# Patient Record
Sex: Female | Born: 1985 | Race: Black or African American | Hispanic: No | Marital: Married | Smoking: Current every day smoker
Health system: Southern US, Community
[De-identification: ages and names within clinical notes are randomized; demographics above are authoritative.]

## PROBLEM LIST (undated history)

## (undated) ENCOUNTER — Emergency Department (HOSPITAL_COMMUNITY): Admission: EM | Discharge status: Absent without leave | Payer: Self-pay

## (undated) DIAGNOSIS — Z8739 Personal history of other diseases of the musculoskeletal system and connective tissue: Secondary | ICD-10-CM

## (undated) DIAGNOSIS — Z9289 Personal history of other medical treatment: Secondary | ICD-10-CM

## (undated) DIAGNOSIS — K219 Gastro-esophageal reflux disease without esophagitis: Secondary | ICD-10-CM

## (undated) DIAGNOSIS — Z862 Personal history of diseases of the blood and blood-forming organs and certain disorders involving the immune mechanism: Secondary | ICD-10-CM

## (undated) DIAGNOSIS — I10 Essential (primary) hypertension: Secondary | ICD-10-CM

## (undated) DIAGNOSIS — M722 Plantar fascial fibromatosis: Secondary | ICD-10-CM

## (undated) DIAGNOSIS — Z87898 Personal history of other specified conditions: Secondary | ICD-10-CM

## (undated) DIAGNOSIS — J45909 Unspecified asthma, uncomplicated: Secondary | ICD-10-CM

## (undated) DIAGNOSIS — R599 Enlarged lymph nodes, unspecified: Secondary | ICD-10-CM

## (undated) DIAGNOSIS — F1910 Other psychoactive substance abuse, uncomplicated: Secondary | ICD-10-CM

## (undated) DIAGNOSIS — IMO0002 Reserved for concepts with insufficient information to code with codable children: Secondary | ICD-10-CM

## (undated) HISTORY — DX: Personal history of diseases of the blood and blood-forming organs and certain disorders involving the immune mechanism: Z86.2

## (undated) HISTORY — DX: Personal history of other medical treatment: Z92.89

## (undated) HISTORY — DX: Personal history of other diseases of the musculoskeletal system and connective tissue: Z87.39

## (undated) HISTORY — DX: Enlarged lymph nodes, unspecified: R59.9

## (undated) HISTORY — DX: Plantar fascial fibromatosis: M72.2

## (undated) HISTORY — DX: Personal history of other specified conditions: Z87.898

## (undated) HISTORY — PX: OTHER SURGICAL HISTORY: SHX169

---
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
## 2005-11-08 ENCOUNTER — Emergency Department (HOSPITAL_COMMUNITY): Admission: EM | Admit: 2005-11-08 | Discharge: 2005-11-08 | Payer: Self-pay | Admitting: Emergency Medicine

## 2006-01-19 ENCOUNTER — Emergency Department (HOSPITAL_COMMUNITY): Admission: EM | Admit: 2006-01-19 | Discharge: 2006-01-19 | Payer: Self-pay | Admitting: Emergency Medicine

## 2006-03-10 ENCOUNTER — Emergency Department (HOSPITAL_COMMUNITY): Admission: EM | Admit: 2006-03-10 | Discharge: 2006-03-10 | Payer: Self-pay | Admitting: Emergency Medicine

## 2006-07-17 ENCOUNTER — Emergency Department (HOSPITAL_COMMUNITY): Admission: EM | Admit: 2006-07-17 | Discharge: 2006-07-17 | Payer: Self-pay | Admitting: Emergency Medicine

## 2006-12-12 ENCOUNTER — Emergency Department (HOSPITAL_COMMUNITY): Admission: EM | Admit: 2006-12-12 | Discharge: 2006-12-12 | Payer: Self-pay | Admitting: Emergency Medicine

## 2008-02-11 ENCOUNTER — Emergency Department (HOSPITAL_COMMUNITY): Admission: EM | Admit: 2008-02-11 | Discharge: 2008-02-11 | Payer: Self-pay | Admitting: Emergency Medicine

## 2010-05-26 LAB — DIFFERENTIAL
Eosinophils Relative: 3 % (ref 0–5)
Lymphocytes Relative: 35 % (ref 12–46)
Lymphs Abs: 2.9 10*3/uL (ref 0.7–4.0)
Monocytes Absolute: 0.4 10*3/uL (ref 0.1–1.0)
Neutro Abs: 4.7 10*3/uL (ref 1.7–7.7)

## 2010-05-26 LAB — COMPREHENSIVE METABOLIC PANEL
ALT: 15 U/L (ref 0–35)
AST: 17 U/L (ref 0–37)
Albumin: 3.8 g/dL (ref 3.5–5.2)
Alkaline Phosphatase: 38 U/L — ABNORMAL LOW (ref 39–117)
BUN: 10 mg/dL (ref 6–23)
CO2: 22 mEq/L (ref 19–32)
Calcium: 8.9 mg/dL (ref 8.4–10.5)
Chloride: 104 mEq/L (ref 96–112)
Creatinine, Ser: 0.49 mg/dL (ref 0.4–1.2)
GFR calc Af Amer: >60 mL/min (ref >60)
GFR calc non Af Amer: >60 mL/min (ref >60)
Glucose, Bld: 92 mg/dL (ref 70–99)
Potassium: 3.7 mEq/L (ref 3.5–5.1)
Sodium: 134 mEq/L — ABNORMAL LOW (ref 135–145)
Total Bilirubin: 0.6 mg/dL (ref 0.3–1.2)
Total Protein: 6.8 g/dL (ref 6.0–8.3)

## 2010-05-26 LAB — CBC
HCT: 35.2 % — ABNORMAL LOW (ref 36.0–46.0)
Hemoglobin: 11.1 g/dL — ABNORMAL LOW (ref 12.0–15.0)
MCHC: 31.4 g/dL (ref 30.0–36.0)
MCV: 79.7 fL (ref 78.0–100.0)
Platelets: 261 10*3/uL (ref 150–400)
RBC: 4.42 MIL/uL (ref 3.87–5.11)
RDW: 15.8 % — ABNORMAL HIGH (ref 11.5–15.5)
WBC: 8.4 10*3/uL (ref 4.0–10.5)

## 2010-05-26 LAB — LIPASE, BLOOD: Lipase: 47 U/L (ref 11–59)

## 2010-11-18 LAB — RAPID URINE DRUG SCREEN, HOSP PERFORMED
Amphetamines: NOT DETECTED
Barbiturates: NOT DETECTED
Benzodiazepines: NOT DETECTED
Opiates: POSITIVE — AB

## 2010-11-18 LAB — DIFFERENTIAL
Basophils Absolute: 0
Basophils Relative: 0
Eosinophils Absolute: 0.1
Eosinophils Relative: 0
Lymphs Abs: 2.4
Neutrophils Relative %: 82 — ABNORMAL HIGH

## 2010-11-18 LAB — COMPREHENSIVE METABOLIC PANEL
ALT: 14
AST: 21
Alkaline Phosphatase: 50
CO2: 21
Calcium: 9.7
Chloride: 106
GFR calc non Af Amer: >60
Glucose, Bld: 133 — ABNORMAL HIGH
Sodium: 142
Total Bilirubin: 0.5

## 2010-11-18 LAB — URINALYSIS, ROUTINE W REFLEX MICROSCOPIC
Bilirubin Urine: NEGATIVE
Ketones, ur: >80 — AB
Nitrite: NEGATIVE
Protein, ur: NEGATIVE
Urobilinogen, UA: 0.2

## 2010-11-18 LAB — URINE MICROSCOPIC-ADD ON

## 2010-11-18 LAB — CBC
Hemoglobin: 13.4
MCHC: 33.2
RBC: 4.89
WBC: 15.5 — ABNORMAL HIGH

## 2010-11-18 LAB — URINE CULTURE: Culture: NO GROWTH

## 2010-11-18 LAB — POCT PREGNANCY, URINE: Operator id: 29026

## 2010-11-18 LAB — LIPASE, BLOOD: Lipase: 30

## 2010-11-27 LAB — POCT URINALYSIS DIP (DEVICE)
Nitrite: POSITIVE — AB
Protein, ur: >=300 — AB
Specific Gravity, Urine: 1.015
Urobilinogen, UA: 1
pH: 8.5 — ABNORMAL HIGH

## 2010-11-27 LAB — URINE CULTURE: Colony Count: 70000

## 2010-11-27 LAB — URINALYSIS, ROUTINE W REFLEX MICROSCOPIC
Glucose, UA: NEGATIVE
Ketones, ur: 15 — AB
Protein, ur: >300 — AB
Urobilinogen, UA: 1

## 2010-11-27 LAB — RAPID URINE DRUG SCREEN, HOSP PERFORMED
Amphetamines: NOT DETECTED
Barbiturates: NOT DETECTED
Opiates: NOT DETECTED
Tetrahydrocannabinol: POSITIVE — AB

## 2010-11-27 LAB — I-STAT 8, (EC8 V) (CONVERTED LAB)
Acid-base deficit: 2
Chloride: 107
Hemoglobin: 16 — ABNORMAL HIGH
Potassium: 3.3 — ABNORMAL LOW
Sodium: 140
TCO2: 24

## 2010-11-27 LAB — POCT PREGNANCY, URINE: Preg Test, Ur: NEGATIVE

## 2010-11-27 LAB — POCT I-STAT CREATININE: Operator id: 270961

## 2010-11-27 LAB — URINE MICROSCOPIC-ADD ON

## 2012-08-06 ENCOUNTER — Emergency Department (HOSPITAL_COMMUNITY)
Admission: EM | Admit: 2012-08-06 | Discharge: 2012-08-06 | Disposition: A | Discharge status: Home / Self Care | Payer: Self-pay | Attending: Emergency Medicine | Admitting: Emergency Medicine

## 2012-08-06 ENCOUNTER — Emergency Department (HOSPITAL_COMMUNITY): Payer: Self-pay

## 2012-08-06 ENCOUNTER — Encounter (HOSPITAL_COMMUNITY): Payer: Self-pay | Admitting: Emergency Medicine

## 2012-08-06 DIAGNOSIS — K219 Gastro-esophageal reflux disease without esophagitis: Secondary | ICD-10-CM | POA: Insufficient documentation

## 2012-08-06 DIAGNOSIS — Z872 Personal history of diseases of the skin and subcutaneous tissue: Secondary | ICD-10-CM | POA: Insufficient documentation

## 2012-08-06 DIAGNOSIS — R1013 Epigastric pain: Secondary | ICD-10-CM | POA: Insufficient documentation

## 2012-08-06 DIAGNOSIS — Z79899 Other long term (current) drug therapy: Secondary | ICD-10-CM | POA: Insufficient documentation

## 2012-08-06 DIAGNOSIS — J45909 Unspecified asthma, uncomplicated: Secondary | ICD-10-CM | POA: Insufficient documentation

## 2012-08-06 DIAGNOSIS — E860 Dehydration: Secondary | ICD-10-CM | POA: Insufficient documentation

## 2012-08-06 DIAGNOSIS — R109 Unspecified abdominal pain: Secondary | ICD-10-CM

## 2012-08-06 DIAGNOSIS — Z3202 Encounter for pregnancy test, result negative: Secondary | ICD-10-CM | POA: Insufficient documentation

## 2012-08-06 DIAGNOSIS — N83209 Unspecified ovarian cyst, unspecified side: Secondary | ICD-10-CM | POA: Insufficient documentation

## 2012-08-06 DIAGNOSIS — F17200 Nicotine dependence, unspecified, uncomplicated: Secondary | ICD-10-CM | POA: Insufficient documentation

## 2012-08-06 HISTORY — DX: Unspecified asthma, uncomplicated: J45.909

## 2012-08-06 HISTORY — DX: Reserved for concepts with insufficient information to code with codable children: IMO0002

## 2012-08-06 LAB — COMPREHENSIVE METABOLIC PANEL
AST: 19 U/L (ref 0–37)
CO2: 23 mEq/L (ref 19–32)
Calcium: 9.1 mg/dL (ref 8.4–10.5)
Creatinine, Ser: 0.58 mg/dL (ref 0.50–1.10)
GFR calc non Af Amer: >90 mL/min (ref >90)
Total Protein: 8.1 g/dL (ref 6.0–8.3)

## 2012-08-06 LAB — URINALYSIS, DIPSTICK ONLY
Glucose, UA: NEGATIVE mg/dL
Hgb urine dipstick: NEGATIVE
Protein, ur: NEGATIVE mg/dL
Specific Gravity, Urine: 1.02 (ref 1.005–1.030)
Urobilinogen, UA: 0.2 mg/dL (ref 0.0–1.0)
pH: 6.5 (ref 5.0–8.0)

## 2012-08-06 LAB — CBC
MCH: 30.4 pg (ref 26.0–34.0)
MCHC: 34.8 g/dL (ref 30.0–36.0)
MCV: 87.6 fL (ref 78.0–100.0)
Platelets: 269 10*3/uL (ref 150–400)
WBC: 19.2 10*3/uL — ABNORMAL HIGH (ref 4.0–10.5)

## 2012-08-06 MED ORDER — GI COCKTAIL ~~LOC~~
30.0000 mL | Freq: Once | ORAL | Status: AC
  Administered 2012-08-06: 30 mL via ORAL
  Filled 2012-08-06: qty 30

## 2012-08-06 MED ORDER — HYDROMORPHONE HCL PF 1 MG/ML IJ SOLN
1.0000 mg | INTRAMUSCULAR | Status: DC | PRN
  Administered 2012-08-06 (×2): 1 mg via INTRAVENOUS
  Filled 2012-08-06 (×2): qty 1

## 2012-08-06 MED ORDER — OXYCODONE-ACETAMINOPHEN 5-325 MG PO TABS: 1.0000 | ORAL_TABLET | ORAL | Status: DC | PRN

## 2012-08-06 MED ORDER — SODIUM CHLORIDE 0.9 % IV BOLUS (SEPSIS)
1000.0000 mL | Freq: Once | INTRAVENOUS | Status: AC
  Administered 2012-08-06: 1000 mL via INTRAVENOUS

## 2012-08-06 MED ORDER — ONDANSETRON HCL 8 MG PO TABS: 8.0000 mg | ORAL_TABLET | Freq: Two times a day (BID) | ORAL | Status: DC | PRN

## 2012-08-06 MED ORDER — ONDANSETRON HCL 4 MG/2ML IJ SOLN
4.0000 mg | Freq: Once | INTRAMUSCULAR | Status: AC
  Administered 2012-08-06: 4 mg via INTRAVENOUS
  Filled 2012-08-06: qty 2

## 2012-08-06 MED ORDER — ONDANSETRON 4 MG PO TBDP: ORAL_TABLET | ORAL | Status: DC

## 2012-08-06 MED ORDER — METOCLOPRAMIDE HCL 5 MG/ML IJ SOLN
10.0000 mg | Freq: Once | INTRAMUSCULAR | Status: AC
  Administered 2012-08-06: 10 mg via INTRAVENOUS
  Filled 2012-08-06: qty 2

## 2012-08-06 MED ORDER — OMEPRAZOLE 20 MG PO CPDR: 20.0000 mg | DELAYED_RELEASE_CAPSULE | Freq: Every day | ORAL | Status: DC

## 2012-08-06 MED ORDER — IOHEXOL 300 MG/ML  SOLN
100.0000 mL | Freq: Once | INTRAMUSCULAR | Status: AC | PRN
  Administered 2012-08-06: 100 mL via INTRAVENOUS

## 2012-08-06 NOTE — ED Notes (Signed)
Pt had 1 can of sprite. No n/v. Pt given another sprite per request. Will continue to monitor.

## 2012-08-06 NOTE — ED Provider Notes (Signed)
Patient seen/examined in the Emergency Department in conjunction with Resident Physician Provider [place] Patient reports abdominal pain after eating Exam : pt is anxious, appears uncomfortable and has epigastric tenderness Plan: will treat nausea, rehydrate and reassess.  She may need abdominal imaging    Joya Gaskins, MD 08/06/12 [phone]

## 2012-08-06 NOTE — ED Notes (Signed)
Pt complaining of abdominal pain and n/v. Currently no n/v. Pt continues to complain of abdominal burning. No cardiac or respiratory distress. Will continue to monitor.

## 2012-08-06 NOTE — ED Notes (Signed)
Pt. Stated, i started vomiting 3 hours ago, I think its my ulcer acting up.

## 2012-08-06 NOTE — ED Provider Notes (Signed)
History    CSN: [id number] Arrival date & time 08/06/12  1455  First MD Initiated Contact with Patient 08/06/12 1505     Chief Complaint  Patient presents with  . Emesis   (Consider location/radiation/quality/duration/timing/severity/associated sxs/prior Treatment) Patient is a 27 y.o. female presenting with abdominal pain.  Abdominal Pain This is a recurrent problem. The current episode started today (3 hours ago). The problem occurs constantly. The problem has been unchanged. Associated symptoms include abdominal pain, nausea and vomiting. Pertinent negatives include no chest pain, chills, congestion, coughing, fatigue, fever, numbness, rash, sore throat, swollen glands or urinary symptoms. The symptoms are aggravated by eating and drinking. She has tried nothing for the symptoms.   Past Medical History  Diagnosis Date  . Ulcer   . Asthma    History reviewed. No pertinent past surgical history. No family history on file. History  Substance Use Topics  . Smoking status: Current Every Day Smoker    Types: Cigarettes  . Smokeless tobacco: Not on file  . Alcohol Use: Yes   OB History   Grav Para Term Preterm Abortions TAB SAB Ect Mult Living                 Review of Systems  Constitutional: Negative for fever, chills and fatigue.  HENT: Negative for congestion, sore throat and rhinorrhea.   Eyes: Negative for photophobia and visual disturbance.  Respiratory: Negative for cough and shortness of breath.   Cardiovascular: Negative for chest pain and leg swelling.  Gastrointestinal: Positive for nausea, vomiting and abdominal pain. Negative for diarrhea and constipation.  Endocrine: Negative for polyphagia and polyuria.  Genitourinary: Negative for dysuria, flank pain, vaginal bleeding, vaginal discharge and enuresis.  Musculoskeletal: Negative for back pain and gait problem.  Skin: Negative for color change and rash.  Neurological: Negative for dizziness, syncope,  light-headedness and numbness.  Hematological: Negative for adenopathy. Does not bruise/bleed easily.  All other systems reviewed and are negative.    Allergies  Review of patient's allergies indicates no known allergies.  Home Medications   Current Outpatient Rx  Name  Route  Sig  Dispense  Refill  . omeprazole (PRILOSEC) 20 MG capsule   Oral   Take 1 capsule (20 mg total) by mouth daily.   30 capsule   0   . ondansetron (ZOFRAN ODT) 4 MG disintegrating tablet      4mg  ODT q4 hours prn nausea/vomit   4 tablet   0   . oxyCODONE-acetaminophen (PERCOCET/ROXICET) 5-325 MG per tablet   Oral   Take 1 tablet by mouth every 4 (four) hours as needed for pain.   5 tablet   0    BP 111/75  Pulse 63  Temp(Src) 98 F (36.7 C) (Oral)  Resp 22  Ht 4\' 9"  (1.448 m)  Wt 150 lb (68.04 kg)  BMI 32.45 kg/m2  SpO2 99%  LMP 06/11/2012 Physical Exam  Vitals reviewed. Constitutional: She is oriented to person, place, and time. She appears well-developed and well-nourished.  HENT:  Head: Normocephalic and atraumatic.  Right Ear: External ear normal.  Left Ear: External ear normal.  Eyes: Conjunctivae and EOM are normal. Pupils are equal, round, and reactive to light.  Neck: Normal range of motion. Neck supple.  Cardiovascular: Normal rate, regular rhythm, normal heart sounds and intact distal pulses.   Pulmonary/Chest: Effort normal and breath sounds normal.  Abdominal: Soft. Bowel sounds are normal. There is tenderness in the epigastric area.  Musculoskeletal: Normal range  of motion.  Neurological: She is alert and oriented to person, place, and time.  Skin: Skin is warm and dry.    ED Course  Procedures (including critical care time) Labs Reviewed  COMPREHENSIVE METABOLIC PANEL - Abnormal; Notable for the following:    Potassium 3.4 (*)    Glucose, Bld 110 (*)    BUN 5 (*)    Total Bilirubin 0.2 (*)    All other components within normal limits  CBC - Abnormal; Notable for  the following:    WBC 19.2 (*)    All other components within normal limits  URINALYSIS, DIPSTICK ONLY - Abnormal; Notable for the following:    Ketones, ur 15 (*)    Leukocytes, UA SMALL (*)    All other components within normal limits  CG4 I-STAT (LACTIC ACID) - Abnormal; Notable for the following:    Lactic Acid, Venous 2.89 (*)    All other components within normal limits  LIPASE, BLOOD  POCT PREGNANCY, URINE   Ct Abdomen Pelvis W Contrast  08/06/2012   *RADIOLOGY REPORT*  Clinical Data: Abdominal pain.  Emesis.  History of ulcer.  CT ABDOMEN AND PELVIS WITH CONTRAST  Technique:  Multidetector CT imaging of the abdomen and pelvis was performed following the standard protocol during bolus administration of intravenous contrast.  Contrast: OMNIPAQUE IOHEXOL 300 MG/ML  SOLN  Comparison: 07/17/2006  Findings: Contrast medium is present in the distal esophagus suggesting gastroesophageal reflux.  The liver, spleen, pancreas, and adrenal glands appear unremarkable.  The kidneys appear unremarkable, as do the proximal ureters.  Appendix normal.  Hypodense lesion in the right ovary measures 2.4 x 2.1 cm.  Uterus unremarkable.  Urinary bladder normal.  No free pelvic fluid.  No dilated bowel.  IMPRESSION:  1.  Dense contrast medium is present in the stomach and in the distal esophagus, suspicious for esophageal reflux.  No obvious inflammatory process along the proximal duodenum. 2.  Right ovarian hypodense lesion, likely a cyst, measuring up to 2.4 cm in long axis.   Original Report Authenticated By: Gaylyn Rong, M.D.   1. Abdominal pain   2. GERD (gastroesophageal reflux disease)   3. Dehydration   4. Ovarian cyst     MDM  27 y.o. female  with pertinent PMH of prior pud presents with abd pain beginning 3 hours ago after eating spicy chips and salsa.  Pt has ho similar symptoms after spicy food.  Physical exam as above with epigastric tenderness, otherwise benign exam. Labs as above  with leukocytosis.  CT scan demonstrated likely reflux.  Pt improved after reglan and dilaudid, took PO.  Stable for DC home.  Given strict return precautions for changing or worsening symptoms, voices understanding, and agrees to fu.    Labs and imaging as above reviewed by myself and attending,Dr. Bebe Shaggy, with whom case was discussed.   1. Abdominal pain   2. GERD (gastroesophageal reflux disease)   3. Dehydration   4. Ovarian cyst       Noel Gerold, MD 08/07/12 [phone]

## 2012-08-06 NOTE — ED Notes (Signed)
Pt given ice chips and a sprite. Will continue to monitor.

## 2012-08-10 NOTE — ED Provider Notes (Signed)
I have personally seen and examined the patient.  I have discussed the plan of care with the resident.  I have reviewed the documentation on PMH/FH/Soc. History.  I have reviewed the documentation of the resident and agree.   Joya Gaskins, MD 08/10/12 [phone]

## 2012-09-18 ENCOUNTER — Emergency Department (HOSPITAL_COMMUNITY)
Admission: EM | Admit: 2012-09-18 | Discharge: 2012-09-19 | Disposition: A | Discharge status: Home / Self Care | Payer: Self-pay | Attending: Emergency Medicine | Admitting: Emergency Medicine

## 2012-09-18 ENCOUNTER — Encounter (HOSPITAL_COMMUNITY): Payer: Self-pay | Admitting: *Deleted

## 2012-09-18 DIAGNOSIS — R112 Nausea with vomiting, unspecified: Secondary | ICD-10-CM | POA: Insufficient documentation

## 2012-09-18 DIAGNOSIS — Z872 Personal history of diseases of the skin and subcutaneous tissue: Secondary | ICD-10-CM | POA: Insufficient documentation

## 2012-09-18 DIAGNOSIS — R1013 Epigastric pain: Secondary | ICD-10-CM | POA: Insufficient documentation

## 2012-09-18 DIAGNOSIS — R109 Unspecified abdominal pain: Secondary | ICD-10-CM

## 2012-09-18 DIAGNOSIS — J45901 Unspecified asthma with (acute) exacerbation: Secondary | ICD-10-CM | POA: Insufficient documentation

## 2012-09-18 DIAGNOSIS — F17200 Nicotine dependence, unspecified, uncomplicated: Secondary | ICD-10-CM | POA: Insufficient documentation

## 2012-09-18 DIAGNOSIS — Z3202 Encounter for pregnancy test, result negative: Secondary | ICD-10-CM | POA: Insufficient documentation

## 2012-09-18 DIAGNOSIS — Z79899 Other long term (current) drug therapy: Secondary | ICD-10-CM | POA: Insufficient documentation

## 2012-09-18 LAB — URINE MICROSCOPIC-ADD ON

## 2012-09-18 LAB — COMPREHENSIVE METABOLIC PANEL
ALT: 21 U/L (ref 0–35)
Albumin: 4.6 g/dL (ref 3.5–5.2)
CO2: 26 mEq/L (ref 19–32)
Calcium: 9.8 mg/dL (ref 8.4–10.5)
GFR calc Af Amer: >90 mL/min (ref >90)
GFR calc non Af Amer: >90 mL/min (ref >90)
Sodium: 141 mEq/L (ref 135–145)
Total Bilirubin: 0.3 mg/dL (ref 0.3–1.2)
Total Protein: 8.2 g/dL (ref 6.0–8.3)

## 2012-09-18 LAB — CBC WITH DIFFERENTIAL/PLATELET
Basophils Absolute: 0 10*3/uL (ref 0.0–0.1)
Basophils Relative: 0 % (ref 0–1)
Eosinophils Absolute: 0.3 10*3/uL (ref 0.0–0.7)
Eosinophils Relative: 2 % (ref 0–5)
Lymphocytes Relative: 25 % (ref 12–46)
MCHC: 35.2 g/dL (ref 30.0–36.0)
Monocytes Absolute: 0.8 10*3/uL (ref 0.1–1.0)
Neutrophils Relative %: 68 % (ref 43–77)
RDW: 13.9 % (ref 11.5–15.5)

## 2012-09-18 LAB — URINALYSIS, ROUTINE W REFLEX MICROSCOPIC
Bilirubin Urine: NEGATIVE
Glucose, UA: NEGATIVE mg/dL
Specific Gravity, Urine: 1.023 (ref 1.005–1.030)
Urobilinogen, UA: 0.2 mg/dL (ref 0.0–1.0)
pH: 7 (ref 5.0–8.0)

## 2012-09-18 MED ORDER — ONDANSETRON HCL 4 MG/2ML IJ SOLN
4.0000 mg | Freq: Once | INTRAMUSCULAR | Status: AC
  Administered 2012-09-18: 4 mg via INTRAVENOUS
  Filled 2012-09-18: qty 2

## 2012-09-18 MED ORDER — HYDROMORPHONE HCL PF 1 MG/ML IJ SOLN
1.0000 mg | Freq: Once | INTRAMUSCULAR | Status: AC
  Administered 2012-09-18: 1 mg via INTRAVENOUS
  Filled 2012-09-18: qty 1

## 2012-09-18 MED ORDER — ALBUTEROL SULFATE (5 MG/ML) 0.5% IN NEBU
5.0000 mg | INHALATION_SOLUTION | Freq: Once | RESPIRATORY_TRACT | Status: AC
  Administered 2012-09-18: 5 mg via RESPIRATORY_TRACT
  Filled 2012-09-18: qty 1

## 2012-09-18 MED ORDER — ONDANSETRON 4 MG PO TBDP
8.0000 mg | ORAL_TABLET | Freq: Once | ORAL | Status: AC
  Administered 2012-09-18: 8 mg via ORAL
  Filled 2012-09-18: qty 2

## 2012-09-18 MED ORDER — IPRATROPIUM BROMIDE 0.02 % IN SOLN
0.5000 mg | Freq: Once | RESPIRATORY_TRACT | Status: AC
  Administered 2012-09-18: 0.5 mg via RESPIRATORY_TRACT
  Filled 2012-09-18: qty 2.5

## 2012-09-18 MED ORDER — GI COCKTAIL ~~LOC~~
30.0000 mL | Freq: Once | ORAL | Status: AC
  Administered 2012-09-18: 30 mL via ORAL
  Filled 2012-09-18: qty 30

## 2012-09-18 NOTE — ED Notes (Signed)
The pt is happier now that she has an iv.  She has also been given  Pain and nausea med./  The pt denies drinking alcohol she has old alcohol on her breath

## 2012-09-18 NOTE — ED Provider Notes (Signed)
CSN: [id number]     Arrival date & time 09/18/12  2001 History     First MD Initiated Contact with Patient 09/18/12 2200     Chief Complaint  Patient presents with  . Abdominal Pain   (Consider location/radiation/quality/duration/timing/severity/associated sxs/prior Treatment) Patient is a 27 y.o. female presenting with abdominal pain.  Abdominal Pain Associated symptoms: nausea and vomiting    Complaint of epigastric pain, nonradiating burning in quality after eating hot and spicy food 1 PM today symptoms accompanied by vomiting. She's had similar symptoms in the past with eating hot and spicy food. She also drank alcohol last night. Patient had CT scan June 2014 which showed probable GERD. She takes no medications .Marland Kitchen No fever. Last bowel movement yesterday, normal last normal menstrual period ended July 2014. No other complaint Past Medical History  Diagnosis Date  . Ulcer   . Asthma    History reviewed. No pertinent past surgical history. No family history on file. History  Substance Use Topics  . Smoking status: Current Every Day Smoker    Types: Cigarettes  . Smokeless tobacco: Not on file  . Alcohol Use: Yes   OB History   Grav Para Term Preterm Abortions TAB SAB Ect Mult Living                 Review of Systems  Constitutional: Negative.   HENT: Negative.   Respiratory: Negative.   Cardiovascular: Negative.   Gastrointestinal: Positive for nausea, vomiting and abdominal pain.  Musculoskeletal: Negative.   Skin: Negative.   Neurological: Negative.   Psychiatric/Behavioral: Negative.     Allergies  Review of patient's allergies indicates no known allergies.  Home Medications   Current Outpatient Rx  Name  Route  Sig  Dispense  Refill  . albuterol (PROVENTIL HFA;VENTOLIN HFA) 108 (90 BASE) MCG/ACT inhaler   Inhalation   Inhale 2 puffs into the lungs every 6 (six) hours as needed for wheezing.          BP 153/87  Pulse 87  Temp(Src) 99.6 F (37.6 C)  (Oral)  Resp 18  SpO2 94% Physical Exam  Nursing note and vitals reviewed. Constitutional: She appears well-developed and well-nourished. She appears distressed.  Appears uncomfortable  HENT:  Head: Normocephalic and atraumatic.  Eyes: Conjunctivae are normal. Pupils are equal, round, and reactive to light.  Neck: Neck supple. No tracheal deviation present. No thyromegaly present.  Cardiovascular: Normal rate and regular rhythm.   No murmur heard. Pulmonary/Chest: Effort normal. She has wheezes.  Abdominal: Soft. Bowel sounds are normal. She exhibits no distension and no mass. There is tenderness. There is no rebound and no guarding.  Epigastric tenderness  Musculoskeletal: Normal range of motion. She exhibits no edema and no tenderness.  Neurological: She is alert. Coordination normal.  Skin: Skin is warm and dry. No rash noted.  Psychiatric: She has a normal mood and affect.    ED Course   Procedures (including critical care time)  Labs Reviewed  CBC WITH DIFFERENTIAL - Abnormal; Notable for the following:    WBC 13.7 (*)    Hemoglobin 15.2 (*)    Neutro Abs 9.3 (*)    All other components within normal limits  COMPREHENSIVE METABOLIC PANEL - Abnormal; Notable for the following:    Potassium 3.4 (*)    All other components within normal limits  URINALYSIS, ROUTINE W REFLEX MICROSCOPIC - Abnormal; Notable for the following:    APPearance CLOUDY (*)    Ketones, ur 15 (*)  Leukocytes, UA MODERATE (*)    All other components within normal limits  URINE MICROSCOPIC-ADD ON - Abnormal; Notable for the following:    Squamous Epithelial / LPF MANY (*)    All other components within normal limits  LIPASE, BLOOD  POCT PREGNANCY, URINE   No results found.   Results for orders placed during the hospital encounter of 09/18/12  CBC WITH DIFFERENTIAL      Result Value Range   WBC 13.7 (*) 4.0 - 10.5 K/uL   RBC 4.91  3.87 - 5.11 MIL/uL   Hemoglobin 15.2 (*) 12.0 - 15.0 g/dL    HCT 96.0  45.4 - 09.8 %   MCV 88.0  78.0 - 100.0 fL   MCH 31.0  26.0 - 34.0 pg   MCHC 35.2  30.0 - 36.0 g/dL   RDW 11.9  14.7 - 82.9 %   Platelets 283  150 - 400 K/uL   Neutrophils Relative % 68  43 - 77 %   Neutro Abs 9.3 (*) 1.7 - 7.7 K/uL   Lymphocytes Relative 25  12 - 46 %   Lymphs Abs 3.4  0.7 - 4.0 K/uL   Monocytes Relative 6  3 - 12 %   Monocytes Absolute 0.8  0.1 - 1.0 K/uL   Eosinophils Relative 2  0 - 5 %   Eosinophils Absolute 0.3  0.0 - 0.7 K/uL   Basophils Relative 0  0 - 1 %   Basophils Absolute 0.0  0.0 - 0.1 K/uL  COMPREHENSIVE METABOLIC PANEL      Result Value Range   Sodium 141  135 - 145 mEq/L   Potassium 3.4 (*) 3.5 - 5.1 mEq/L   Chloride 102  96 - 112 mEq/L   CO2 26  19 - 32 mEq/L   Glucose, Bld 93  70 - 99 mg/dL   BUN 6  6 - 23 mg/dL   Creatinine, Ser 5.62  0.50 - 1.10 mg/dL   Calcium 9.8  8.4 - 13.0 mg/dL   Total Protein 8.2  6.0 - 8.3 g/dL   Albumin 4.6  3.5 - 5.2 g/dL   AST 23  0 - 37 U/L   ALT 21  0 - 35 U/L   Alkaline Phosphatase 67  39 - 117 U/L   Total Bilirubin 0.3  0.3 - 1.2 mg/dL   GFR calc non Af Amer >90  >90 mL/min   GFR calc Af Amer >90  >90 mL/min  LIPASE, BLOOD      Result Value Range   Lipase 35  11 - 59 U/L  URINALYSIS, ROUTINE W REFLEX MICROSCOPIC      Result Value Range   Color, Urine YELLOW  YELLOW   APPearance CLOUDY (*) CLEAR   Specific Gravity, Urine 1.023  1.005 - 1.030   pH 7.0  5.0 - 8.0   Glucose, UA NEGATIVE  NEGATIVE mg/dL   Hgb urine dipstick NEGATIVE  NEGATIVE   Bilirubin Urine NEGATIVE  NEGATIVE   Ketones, ur 15 (*) NEGATIVE mg/dL   Protein, ur NEGATIVE  NEGATIVE mg/dL   Urobilinogen, UA 0.2  0.0 - 1.0 mg/dL   Nitrite NEGATIVE  NEGATIVE   Leukocytes, UA MODERATE (*) NEGATIVE  URINE MICROSCOPIC-ADD ON      Result Value Range   Squamous Epithelial / LPF MANY (*) RARE   WBC, UA 7-10  <3 WBC/hpf  POCT PREGNANCY, URINE      Result Value Range   Preg Test, Ur NEGATIVE  NEGATIVE  No results found.  No  diagnosis found. 1:15 PM patient no longer wheezing after treatment with albuterol nebulized treatment. After treatment with antiemetics and analgesics and GI cocktail she feels improved her to home she is no longer vomiting. MDM  Prescription Reglan. Prilosec OTC. She is advised to avoid cigarettes alcohol and spicy foods. Clear liquid diet for the next 24 hours suspect gastriits. Referral wellness center Diagnosis #1 abdominal pain #2 nausea and vomiting  Doug Sou, MD 09/19/12 2841

## 2012-09-18 NOTE — ED Notes (Signed)
The pt is c/o abd pain that she has had for 3 years with intermittent flare-up  Vomiting on arrival.  amb from [facility]

## 2012-09-18 NOTE — ED Notes (Signed)
The pt keeps coughing and  The mucous is causing her to  Vomit.  She is a smoker

## 2012-09-18 NOTE — ED Notes (Signed)
The pt reports that she vomited back the gi cocktail .  And c/o we are giving all thing that are not helping.  However outside the door i did not hear her vomit and there was not any pink liquid in her vomitus bag.  Unhappy with the ttreatment thus far

## 2012-09-18 NOTE — ED Notes (Signed)
The pt is continuing to cough and spit up mucous

## 2012-09-18 NOTE — ED Notes (Signed)
The pt was given a gi cocktail

## 2012-09-18 NOTE — ED Notes (Signed)
The pt is alert  And she keeps spitting  Up mucous.  zofran given.  She describes the pain in her abd as a burning sensation

## 2012-09-18 NOTE — ED Notes (Signed)
The pt is still waiting to see the edp

## 2012-09-19 MED ORDER — METOCLOPRAMIDE HCL 10 MG PO TABS: 10.0000 mg | ORAL_TABLET | Freq: Four times a day (QID) | ORAL | Status: DC | PRN

## 2012-09-19 MED ORDER — METOCLOPRAMIDE HCL 5 MG/ML IJ SOLN
10.0000 mg | Freq: Once | INTRAMUSCULAR | Status: AC
  Administered 2012-09-19: 10 mg via INTRAVENOUS
  Filled 2012-09-19: qty 2

## 2012-09-19 NOTE — ED Notes (Signed)
The pts pain is better she is sleeping intermittently

## 2012-09-19 NOTE — ED Notes (Signed)
The pt had stopped vomiting until she drank water then started  Vomiting again.  Nausea med given.  NPO

## 2012-09-19 NOTE — ED Notes (Signed)
The pt is now requesting more nausea med

## 2012-12-31 ENCOUNTER — Encounter (HOSPITAL_COMMUNITY): Payer: Self-pay | Admitting: Emergency Medicine

## 2012-12-31 ENCOUNTER — Emergency Department (HOSPITAL_COMMUNITY)
Admission: EM | Admit: 2012-12-31 | Discharge: 2012-12-31 | Disposition: A | Discharge status: Home / Self Care | Payer: Self-pay | Attending: Emergency Medicine | Admitting: Emergency Medicine

## 2012-12-31 DIAGNOSIS — R109 Unspecified abdominal pain: Secondary | ICD-10-CM

## 2012-12-31 DIAGNOSIS — J45909 Unspecified asthma, uncomplicated: Secondary | ICD-10-CM | POA: Insufficient documentation

## 2012-12-31 DIAGNOSIS — Z79899 Other long term (current) drug therapy: Secondary | ICD-10-CM | POA: Insufficient documentation

## 2012-12-31 DIAGNOSIS — F17200 Nicotine dependence, unspecified, uncomplicated: Secondary | ICD-10-CM | POA: Insufficient documentation

## 2012-12-31 DIAGNOSIS — G43909 Migraine, unspecified, not intractable, without status migrainosus: Secondary | ICD-10-CM | POA: Insufficient documentation

## 2012-12-31 DIAGNOSIS — K2970 Gastritis, unspecified, without bleeding: Secondary | ICD-10-CM

## 2012-12-31 DIAGNOSIS — K279 Peptic ulcer, site unspecified, unspecified as acute or chronic, without hemorrhage or perforation: Secondary | ICD-10-CM

## 2012-12-31 DIAGNOSIS — Z872 Personal history of diseases of the skin and subcutaneous tissue: Secondary | ICD-10-CM | POA: Insufficient documentation

## 2012-12-31 DIAGNOSIS — Z7982 Long term (current) use of aspirin: Secondary | ICD-10-CM | POA: Insufficient documentation

## 2012-12-31 LAB — COMPREHENSIVE METABOLIC PANEL
ALT: 16 U/L (ref 0–35)
AST: 21 U/L (ref 0–37)
Albumin: 4.7 g/dL (ref 3.5–5.2)
BUN: 6 mg/dL (ref 6–23)
Chloride: 101 mEq/L (ref 96–112)
Creatinine, Ser: 0.6 mg/dL (ref 0.50–1.10)
Glucose, Bld: 87 mg/dL (ref 70–99)
Potassium: 3.5 mEq/L (ref 3.5–5.1)
Total Bilirubin: 0.3 mg/dL (ref 0.3–1.2)
Total Protein: 8.3 g/dL (ref 6.0–8.3)

## 2012-12-31 LAB — CBC WITH DIFFERENTIAL/PLATELET
Basophils Absolute: 0 10*3/uL (ref 0.0–0.1)
Basophils Relative: 0 % (ref 0–1)
Eosinophils Relative: 1 % (ref 0–5)
Hemoglobin: 14.6 g/dL (ref 12.0–15.0)
Lymphocytes Relative: 26 % (ref 12–46)
MCH: 29.9 pg (ref 26.0–34.0)
MCHC: 33.6 g/dL (ref 30.0–36.0)
MCV: 88.8 fL (ref 78.0–100.0)
Monocytes Absolute: 0.8 10*3/uL (ref 0.1–1.0)
Neutro Abs: 11 10*3/uL — ABNORMAL HIGH (ref 1.7–7.7)
Neutrophils Relative %: 68 % (ref 43–77)
Platelets: 300 10*3/uL (ref 150–400)
WBC: 16.1 10*3/uL — ABNORMAL HIGH (ref 4.0–10.5)

## 2012-12-31 MED ORDER — OMEPRAZOLE 20 MG PO CPDR: 20.0000 mg | DELAYED_RELEASE_CAPSULE | Freq: Two times a day (BID) | ORAL | Status: DC

## 2012-12-31 MED ORDER — HYDROCODONE-ACETAMINOPHEN 5-325 MG PO TABS: 2.0000 | ORAL_TABLET | ORAL | Status: DC | PRN

## 2012-12-31 MED ORDER — PROMETHAZINE HCL 25 MG/ML IJ SOLN
25.0000 mg | Freq: Once | INTRAMUSCULAR | Status: AC
  Administered 2012-12-31: 25 mg via INTRAMUSCULAR
  Filled 2012-12-31: qty 1

## 2012-12-31 MED ORDER — HYDROMORPHONE HCL PF 1 MG/ML IJ SOLN
0.5000 mg | Freq: Once | INTRAMUSCULAR | Status: AC
  Administered 2012-12-31: 0.5 mg via INTRAMUSCULAR
  Filled 2012-12-31: qty 1

## 2012-12-31 MED ORDER — MORPHINE SULFATE 4 MG/ML IJ SOLN
8.0000 mg | Freq: Once | INTRAMUSCULAR | Status: AC
  Administered 2012-12-31: 8 mg via INTRAMUSCULAR
  Filled 2012-12-31: qty 2

## 2012-12-31 MED ORDER — SUCRALFATE 1 G PO TABS: 1.0000 g | ORAL_TABLET | Freq: Four times a day (QID) | ORAL | Status: DC

## 2012-12-31 MED ORDER — PROMETHAZINE HCL 25 MG PO TABS: 25.0000 mg | ORAL_TABLET | Freq: Four times a day (QID) | ORAL | Status: DC | PRN

## 2012-12-31 NOTE — ED Notes (Signed)
Per pt has severe mid abd pain since 0600 this am. Pt complaint of n/v but denies diarrhea. Pt reports hx of stomach ulcers and "this pain is similar."

## 2012-12-31 NOTE — ED Notes (Signed)
Pt actively vomiting. EDP aware. Phenergan administered at 1820.

## 2012-12-31 NOTE — ED Provider Notes (Addendum)
CSN: [id number]     Arrival date & time 12/31/12  1549 History   First MD Initiated Contact with Patient 12/31/12 1554     Chief Complaint  Patient presents with  . Abdominal Pain   HPI  Patient presents with right upper quadrant epigastric pain since late last night and early this morning. Also nausea. She has had multiple emergency room visits this year primarily related to her upper abdominal pain. Has been clinically diagnosed with gastritis or ulcers or an acid related phenomenon. This has been based on negative evaluations and imaging. Has been given explicit instructions regarding stopping smoking avoiding alcohol not using anti-inflammatories and avoiding caffeine. She smokes half pack a day. She had a few beers last night. She takes aspirin 3 nights a week for migraine headaches. No emesis. No hematemesis. No melena. Is not lightheaded or dizzy. No chest pain or shortness of breath  Past Medical History  Diagnosis Date  . Ulcer   . Asthma    History reviewed. No pertinent past surgical history. History reviewed. No pertinent family history. History  Substance Use Topics  . Smoking status: Current Every Day Smoker    Types: Cigarettes  . Smokeless tobacco: Not on file  . Alcohol Use: Yes   OB History   Grav Para Term Preterm Abortions TAB SAB Ect Mult Living                 Review of Systems  Constitutional: Negative for fever, chills, diaphoresis, appetite change and fatigue.  HENT: Negative for mouth sores, sore throat and trouble swallowing.   Eyes: Negative for visual disturbance.  Respiratory: Negative for cough, chest tightness, shortness of breath and wheezing.   Cardiovascular: Negative for chest pain.  Gastrointestinal: Positive for nausea and abdominal pain. Negative for vomiting, diarrhea and abdominal distention.  Endocrine: Negative for polydipsia, polyphagia and polyuria.  Genitourinary: Negative for dysuria, frequency and hematuria.  Musculoskeletal:  Negative for gait problem.  Skin: Negative for color change, pallor and rash.  Neurological: Negative for dizziness, syncope, light-headedness and headaches.  Hematological: Does not bruise/bleed easily.  Psychiatric/Behavioral: Negative for behavioral problems and confusion.    Allergies  Review of patient's allergies indicates no known allergies.  Home Medications   Current Outpatient Rx  Name  Route  Sig  Dispense  Refill  . albuterol (PROVENTIL HFA;VENTOLIN HFA) 108 (90 BASE) MCG/ACT inhaler   Inhalation   Inhale 2 puffs into the lungs every 6 (six) hours as needed for wheezing.         . ondansetron (ZOFRAN) 4 MG tablet   Oral   Take 4 mg by mouth every 8 (eight) hours as needed for nausea or vomiting.         Marland Kitchen oxyCODONE-acetaminophen (PERCOCET/ROXICET) 5-325 MG per tablet   Oral   Take 1 tablet by mouth every 4 (four) hours as needed for moderate pain or severe pain.         . promethazine (PHENERGAN) 25 MG tablet   Oral   Take 25 mg by mouth every 6 (six) hours as needed for nausea or vomiting.         Marland Kitchen HYDROcodone-acetaminophen (NORCO/VICODIN) 5-325 MG per tablet   Oral   Take 2 tablets by mouth every 4 (four) hours as needed.   10 tablet   0   . omeprazole (PRILOSEC) 20 MG capsule   Oral   Take 1 capsule (20 mg total) by mouth 2 (two) times daily.   60  capsule   0   . promethazine (PHENERGAN) 25 MG tablet   Oral   Take 1 tablet (25 mg total) by mouth every 6 (six) hours as needed for nausea or vomiting.   30 tablet   0   . sucralfate (CARAFATE) 1 G tablet   Oral   Take 1 tablet (1 g total) by mouth 4 (four) times daily.   60 tablet   0    BP 116/73  Pulse 80  Temp(Src) 98.1 F (36.7 C) (Oral)  Resp 16  SpO2 99%  LMP 12/10/2012 Physical Exam  Constitutional: She is oriented to person, place, and time. She appears well-developed and well-nourished. No distress.  Tearful. Holding emesis basin. No vomiting.  HENT:  Head: Normocephalic.    Eyes: Conjunctivae are normal. Pupils are equal, round, and reactive to light. No scleral icterus.  Neck: Normal range of motion. Neck supple. No thyromegaly present.  Cardiovascular: Normal rate and regular rhythm.  Exam reveals no gallop and no friction rub.   No murmur heard. Pulmonary/Chest: Effort normal and breath sounds normal. No respiratory distress. She has no wheezes. She has no rales.  Abdominal: Soft. Bowel sounds are normal. She exhibits no distension. There is tenderness in the right upper quadrant and epigastric area. There is no rigidity, no rebound, no guarding and negative Murphy's sign.    Musculoskeletal: Normal range of motion.  Neurological: She is alert and oriented to person, place, and time.  Skin: Skin is warm and dry. No rash noted.  Psychiatric: She has a normal mood and affect. Her behavior is normal.    ED Course  Procedures (including critical care time) Labs Review Labs Reviewed  CBC WITH DIFFERENTIAL - Abnormal; Notable for the following:    WBC 16.1 (*)    Neutro Abs 11.0 (*)    Lymphs Abs 4.2 (*)    All other components within normal limits  COMPREHENSIVE METABOLIC PANEL  LIPASE, BLOOD   Imaging Review No results found.  EKG Interpretation   None       MDM   1. Abdominal pain   2. Gastritis   3. Peptic ulcer disease    Since about biliary and pancreatic enzymes remain normal. I reviewed again with her multiple visits. Given information multiple times about not smoking, using alcohol. Not using excessive caffeine. Not using aspirin. She continues to smoke she continues to drink limits per week. She did used to take aspirin several times a week for headaches. She did used to drink caffeine. I do not think this represents an alternate diagnosis. She is rather demanding IV pain medication. This is indicated I declined this. Complete medical screening exam. Plan will be proton pump inhibitors. I written her in no uncertain terms of which may  be due to improving. Encourage her to followup with her primary care physician if not improving. I have asked her to return if any worsening symptoms, to include, sudden worsening of pain, bloody or black stools, fever, or other changes.    Roney Marion, MD 12/31/12 1759  Roney Marion, MD 12/31/12 [phone]

## 2013-03-26 ENCOUNTER — Encounter (HOSPITAL_COMMUNITY): Payer: Self-pay | Admitting: Emergency Medicine

## 2013-03-26 ENCOUNTER — Emergency Department (HOSPITAL_COMMUNITY)
Admission: EM | Admit: 2013-03-26 | Discharge: 2013-03-26 | Disposition: A | Discharge status: Home / Self Care | Payer: Self-pay | Attending: Emergency Medicine | Admitting: Emergency Medicine

## 2013-03-26 DIAGNOSIS — R1110 Vomiting, unspecified: Secondary | ICD-10-CM

## 2013-03-26 DIAGNOSIS — K2970 Gastritis, unspecified, without bleeding: Secondary | ICD-10-CM

## 2013-03-26 DIAGNOSIS — K2990 Gastroduodenitis, unspecified, without bleeding: Principal | ICD-10-CM

## 2013-03-26 DIAGNOSIS — F17200 Nicotine dependence, unspecified, uncomplicated: Secondary | ICD-10-CM | POA: Insufficient documentation

## 2013-03-26 DIAGNOSIS — Z3202 Encounter for pregnancy test, result negative: Secondary | ICD-10-CM | POA: Insufficient documentation

## 2013-03-26 DIAGNOSIS — Z79899 Other long term (current) drug therapy: Secondary | ICD-10-CM | POA: Insufficient documentation

## 2013-03-26 DIAGNOSIS — J45909 Unspecified asthma, uncomplicated: Secondary | ICD-10-CM | POA: Insufficient documentation

## 2013-03-26 DIAGNOSIS — Z872 Personal history of diseases of the skin and subcutaneous tissue: Secondary | ICD-10-CM | POA: Insufficient documentation

## 2013-03-26 LAB — CBC WITH DIFFERENTIAL/PLATELET
BASOS ABS: 0 10*3/uL (ref 0.0–0.1)
Basophils Relative: 0 % (ref 0–1)
EOS ABS: 0.2 10*3/uL (ref 0.0–0.7)
Eosinophils Relative: 2 % (ref 0–5)
HCT: 43.6 % (ref 36.0–46.0)
Hemoglobin: 14.6 g/dL (ref 12.0–15.0)
Lymphocytes Relative: 26 % (ref 12–46)
Lymphs Abs: 3.5 10*3/uL (ref 0.7–4.0)
MCH: 29.9 pg (ref 26.0–34.0)
MCHC: 33.5 g/dL (ref 30.0–36.0)
MCV: 89.3 fL (ref 78.0–100.0)
Monocytes Absolute: 0.6 10*3/uL (ref 0.1–1.0)
Monocytes Relative: 4 % (ref 3–12)
NEUTROS ABS: 9.4 10*3/uL — AB (ref 1.7–7.7)
Neutrophils Relative %: 69 % (ref 43–77)
Platelets: 251 10*3/uL (ref 150–400)
RBC: 4.88 MIL/uL (ref 3.87–5.11)
RDW: 13.8 % (ref 11.5–15.5)
WBC: 13.8 10*3/uL — AB (ref 4.0–10.5)

## 2013-03-26 LAB — URINALYSIS, ROUTINE W REFLEX MICROSCOPIC
BILIRUBIN URINE: NEGATIVE
GLUCOSE, UA: NEGATIVE mg/dL
Hgb urine dipstick: NEGATIVE
Ketones, ur: NEGATIVE mg/dL
NITRITE: NEGATIVE
PH: 7.5 (ref 5.0–8.0)
PROTEIN: NEGATIVE mg/dL
SPECIFIC GRAVITY, URINE: 1.023 (ref 1.005–1.030)
UROBILINOGEN UA: 0.2 mg/dL (ref 0.0–1.0)

## 2013-03-26 LAB — COMPREHENSIVE METABOLIC PANEL
ALT: 18 U/L (ref 0–35)
AST: 20 U/L (ref 0–37)
Albumin: 4.6 g/dL (ref 3.5–5.2)
Alkaline Phosphatase: 61 U/L (ref 39–117)
BUN: 10 mg/dL (ref 6–23)
CO2: 27 meq/L (ref 19–32)
Calcium: 9.6 mg/dL (ref 8.4–10.5)
Chloride: 104 mEq/L (ref 96–112)
Creatinine, Ser: 0.64 mg/dL (ref 0.50–1.10)
GFR calc non Af Amer: >90 mL/min (ref >90)
Glucose, Bld: 110 mg/dL — ABNORMAL HIGH (ref 70–99)
Potassium: 3.7 mEq/L (ref 3.7–5.3)
Sodium: 144 mEq/L (ref 137–147)
Total Bilirubin: 0.3 mg/dL (ref 0.3–1.2)
Total Protein: 8.5 g/dL — ABNORMAL HIGH (ref 6.0–8.3)

## 2013-03-26 LAB — POCT PREGNANCY, URINE: PREG TEST UR: NEGATIVE

## 2013-03-26 LAB — URINE MICROSCOPIC-ADD ON

## 2013-03-26 MED ORDER — ONDANSETRON HCL 4 MG/2ML IJ SOLN
4.0000 mg | Freq: Once | INTRAMUSCULAR | Status: AC
  Administered 2013-03-26: 4 mg via INTRAVENOUS
  Filled 2013-03-26: qty 2

## 2013-03-26 MED ORDER — PROMETHAZINE HCL 25 MG PO TABS: 25.0000 mg | ORAL_TABLET | Freq: Four times a day (QID) | ORAL | Status: DC | PRN

## 2013-03-26 MED ORDER — HYDROCODONE-ACETAMINOPHEN 5-325 MG PO TABS: 1.0000 | ORAL_TABLET | ORAL | Status: DC | PRN

## 2013-03-26 MED ORDER — PANTOPRAZOLE SODIUM 40 MG IV SOLR
40.0000 mg | Freq: Once | INTRAVENOUS | Status: AC
  Administered 2013-03-26: 40 mg via INTRAVENOUS
  Filled 2013-03-26: qty 40

## 2013-03-26 MED ORDER — MORPHINE SULFATE 4 MG/ML IJ SOLN
4.0000 mg | Freq: Once | INTRAMUSCULAR | Status: AC
  Administered 2013-03-26: 4 mg via INTRAVENOUS
  Filled 2013-03-26: qty 1

## 2013-03-26 MED ORDER — SODIUM CHLORIDE 0.9 % IV BOLUS (SEPSIS)
1000.0000 mL | Freq: Once | INTRAVENOUS | Status: AC
  Administered 2013-03-26: 1000 mL via INTRAVENOUS

## 2013-03-26 MED ORDER — PANTOPRAZOLE SODIUM 20 MG PO TBEC: 20.0000 mg | DELAYED_RELEASE_TABLET | Freq: Every day | ORAL | Status: DC

## 2013-03-26 MED ORDER — ONDANSETRON 4 MG PO TBDP
8.0000 mg | ORAL_TABLET | Freq: Once | ORAL | Status: AC
  Administered 2013-03-26: 8 mg via ORAL
  Filled 2013-03-26: qty 2

## 2013-03-26 NOTE — Discharge Instructions (Signed)
Gastritis, Adult  Gastritis is soreness and puffiness (inflammation) of the lining of the stomach. If you do not get help, gastritis can cause bleeding and sores (ulcers) in the stomach.  HOME CARE   · Only take medicine as told by your doctor.  · If you were given antibiotic medicines, take them as told. Finish the medicines even if you start to feel better.  · Drink enough fluids to keep your pee (urine) clear or pale yellow.  · Avoid foods and drinks that make your problems worse. Foods you may want to avoid include:  · Caffeine or alcohol.  · Chocolate.  · Mint.  · Garlic and onions.  · Spicy foods.  · Citrus fruits, including oranges, lemons, or limes.  · Food containing tomatoes, including sauce, chili, salsa, and pizza.  · Fried and fatty foods.  · Eat small meals throughout the day instead of large meals.  GET HELP RIGHT AWAY IF:   · You have black or dark red poop (stools).  · You throw up (vomit) blood. It may look like coffee grounds.  · You cannot keep fluids down.  · Your belly (abdominal) pain gets worse.  · You have a fever.  · You do not feel better after 1 week.  · You have any other questions or concerns.  MAKE SURE YOU:   · Understand these instructions.  · Will watch your condition.  · Will get help right away if you are not doing well or get worse.  Document Released: 07/15/2007 Document Revised: 04/20/2011 Document Reviewed: 03/11/2011  [employer]® Patient Information ©2014 [employer].

## 2013-03-26 NOTE — ED Provider Notes (Signed)
Medical screening examination/treatment/procedure(s) were performed by non-physician practitioner and as supervising physician I was immediately available for consultation/collaboration.  EKG Interpretation   None        Shon Batonourtney F Danea Manter, MD 03/26/13 2239

## 2013-03-26 NOTE — ED Notes (Signed)
Patient C/O nausea and vomiting that began this morning.  States that she is unable to keep anything on her stomach. Denies any blood in emesis.

## 2013-03-26 NOTE — ED Notes (Signed)
Pt c/o abdominal pain with n/v. Pt actively vomiting in triage. Pt denies being around others with illness.

## 2013-03-26 NOTE — ED Provider Notes (Signed)
CSN: [id number]     Arrival date & time 03/26/13  1441 History   First MD Initiated Contact with Patient 03/26/13 1609     Chief Complaint  Patient presents with  . Abdominal Pain     (Consider location/radiation/quality/duration/timing/severity/associated sxs/prior Treatment) HPI Comments: Pt is c/o upper abdominal pain and vomiting that started this morning. Pt states that she has been unable to keep anything down. Pt states that she has a history of this happening in the past and was told that she has an ulcer. Denies fever and diarrhea  The history is provided by the patient. No language interpreter was used.    Past Medical History  Diagnosis Date  . Ulcer   . Asthma    History reviewed. No pertinent past surgical history. No family history on file. History  Substance Use Topics  . Smoking status: Current Every Day Smoker    Types: Cigarettes  . Smokeless tobacco: Not on file  . Alcohol Use: Yes   OB History   Grav Para Term Preterm Abortions TAB SAB Ect Mult Living                 Review of Systems  Constitutional: Negative.   Respiratory: Negative.       Allergies  Review of patient's allergies indicates no known allergies.  Home Medications   Current Outpatient Rx  Name  Route  Sig  Dispense  Refill  . albuterol (PROVENTIL HFA;VENTOLIN HFA) 108 (90 BASE) MCG/ACT inhaler   Inhalation   Inhale 2 puffs into the lungs every 6 (six) hours as needed for wheezing.         Marland Kitchen. HYDROcodone-acetaminophen (NORCO/VICODIN) 5-325 MG per tablet   Oral   Take 2 tablets by mouth every 4 (four) hours as needed.   10 tablet   0   . omeprazole (PRILOSEC) 20 MG capsule   Oral   Take 1 capsule (20 mg total) by mouth 2 (two) times daily.   60 capsule   0   . ondansetron (ZOFRAN) 4 MG tablet   Oral   Take 4 mg by mouth every 8 (eight) hours as needed for nausea or vomiting.         Marland Kitchen. oxyCODONE-acetaminophen (PERCOCET/ROXICET) 5-325 MG per tablet   Oral   Take 1  tablet by mouth every 4 (four) hours as needed for moderate pain or severe pain.         . promethazine (PHENERGAN) 25 MG tablet   Oral   Take 25 mg by mouth every 6 (six) hours as needed for nausea or vomiting.         . promethazine (PHENERGAN) 25 MG tablet   Oral   Take 1 tablet (25 mg total) by mouth every 6 (six) hours as needed for nausea or vomiting.   30 tablet   0   . sucralfate (CARAFATE) 1 G tablet   Oral   Take 1 tablet (1 g total) by mouth 4 (four) times daily.   60 tablet   0    BP 144/91  Pulse 71  Temp(Src) 97.4 F (36.3 C) (Oral)  Resp 18  Wt 157 lb 9 oz (71.47 kg)  SpO2 100%  LMP 02/12/2013 Physical Exam  Nursing note and vitals reviewed. Constitutional: She is oriented to person, place, and time. She appears well-developed and well-nourished.  Cardiovascular: Normal rate and regular rhythm.   Pulmonary/Chest: Effort normal and breath sounds normal.  Abdominal: Soft. Bowel sounds are normal.  Generalized upper abdominal tenderness  Musculoskeletal: Normal range of motion.  Neurological: She is alert and oriented to person, place, and time.  Skin: Skin is warm and dry.    ED Course  Procedures (including critical care time) Labs Review Labs Reviewed  CBC WITH DIFFERENTIAL - Abnormal; Notable for the following:    WBC 13.8 (*)    Neutro Abs 9.4 (*)    All other components within normal limits  COMPREHENSIVE METABOLIC PANEL - Abnormal; Notable for the following:    Glucose, Bld 110 (*)    Total Protein 8.5 (*)    All other components within normal limits  URINALYSIS, ROUTINE W REFLEX MICROSCOPIC - Abnormal; Notable for the following:    APPearance CLOUDY (*)    Leukocytes, UA SMALL (*)    All other components within normal limits  URINE MICROSCOPIC-ADD ON - Abnormal; Notable for the following:    Squamous Epithelial / LPF MANY (*)    Bacteria, UA FEW (*)    All other components within normal limits  URINE CULTURE  POCT PREGNANCY, URINE    Imaging Review No results found.  EKG Interpretation   None       MDM   Final diagnoses:  Vomiting  Gastritis    No definite infection noted to the urine, will send for culture. Pt is tolerating po at this time and is feeling better:likely gastritis. Abdomen benign on reassessment    Teressa Lower, NP 03/26/13 1958

## 2013-03-27 LAB — URINE CULTURE: Colony Count: 70000

## 2014-02-03 DIAGNOSIS — J45909 Unspecified asthma, uncomplicated: Secondary | ICD-10-CM | POA: Insufficient documentation

## 2014-02-03 DIAGNOSIS — Z3202 Encounter for pregnancy test, result negative: Secondary | ICD-10-CM | POA: Insufficient documentation

## 2014-02-03 DIAGNOSIS — R112 Nausea with vomiting, unspecified: Secondary | ICD-10-CM | POA: Insufficient documentation

## 2014-02-03 DIAGNOSIS — Z79899 Other long term (current) drug therapy: Secondary | ICD-10-CM | POA: Insufficient documentation

## 2014-02-03 DIAGNOSIS — Z791 Long term (current) use of non-steroidal anti-inflammatories (NSAID): Secondary | ICD-10-CM | POA: Insufficient documentation

## 2014-02-03 DIAGNOSIS — A5901 Trichomonal vulvovaginitis: Secondary | ICD-10-CM | POA: Insufficient documentation

## 2014-02-03 DIAGNOSIS — Z720 Tobacco use: Secondary | ICD-10-CM | POA: Insufficient documentation

## 2014-02-03 DIAGNOSIS — R509 Fever, unspecified: Secondary | ICD-10-CM | POA: Insufficient documentation

## 2014-02-03 DIAGNOSIS — Z872 Personal history of diseases of the skin and subcutaneous tissue: Secondary | ICD-10-CM | POA: Insufficient documentation

## 2014-02-03 LAB — CBC WITH DIFFERENTIAL/PLATELET
BASOS PCT: 0 % (ref 0–1)
Basophils Absolute: 0 10*3/uL (ref 0.0–0.1)
EOS ABS: 0.3 10*3/uL (ref 0.0–0.7)
Eosinophils Relative: 4 % (ref 0–5)
HCT: 41.8 % (ref 36.0–46.0)
Hemoglobin: 13.6 g/dL (ref 12.0–15.0)
LYMPHS ABS: 3.4 10*3/uL (ref 0.7–4.0)
Lymphocytes Relative: 40 % (ref 12–46)
MCH: 28.5 pg (ref 26.0–34.0)
MCHC: 32.5 g/dL (ref 30.0–36.0)
MCV: 87.6 fL (ref 78.0–100.0)
MONO ABS: 0.6 10*3/uL (ref 0.1–1.0)
Monocytes Relative: 7 % (ref 3–12)
Neutro Abs: 4.1 10*3/uL (ref 1.7–7.7)
Neutrophils Relative %: 49 % (ref 43–77)
PLATELETS: 254 10*3/uL (ref 150–400)
RBC: 4.77 MIL/uL (ref 3.87–5.11)
RDW: 13.6 % (ref 11.5–15.5)
WBC: 8.5 10*3/uL (ref 4.0–10.5)

## 2014-02-03 LAB — COMPREHENSIVE METABOLIC PANEL
ALT: 23 U/L (ref 0–35)
AST: 25 U/L (ref 0–37)
Albumin: 4.2 g/dL (ref 3.5–5.2)
Alkaline Phosphatase: 55 U/L (ref 39–117)
Anion gap: 9 (ref 5–15)
BUN: 8 mg/dL (ref 6–23)
CHLORIDE: 105 meq/L (ref 96–112)
CO2: 24 mmol/L (ref 19–32)
Calcium: 9.5 mg/dL (ref 8.4–10.5)
Creatinine, Ser: 0.64 mg/dL (ref 0.50–1.10)
Glucose, Bld: 94 mg/dL (ref 70–99)
POTASSIUM: 3.9 mmol/L (ref 3.5–5.1)
SODIUM: 138 mmol/L (ref 135–145)
TOTAL PROTEIN: 7.4 g/dL (ref 6.0–8.3)
Total Bilirubin: 0.5 mg/dL (ref 0.3–1.2)

## 2014-02-03 NOTE — ED Notes (Signed)
Pt has having nvomiting x 3 dys.  Was hospitalized at [facility] for a week at thanksgiving for an "ulcer"

## 2014-02-04 ENCOUNTER — Emergency Department (HOSPITAL_COMMUNITY)
Admission: EM | Admit: 2014-02-04 | Discharge: 2014-02-04 | Disposition: A | Discharge status: Home / Self Care | Payer: Self-pay | Attending: Emergency Medicine | Admitting: Emergency Medicine

## 2014-02-04 DIAGNOSIS — R112 Nausea with vomiting, unspecified: Secondary | ICD-10-CM

## 2014-02-04 DIAGNOSIS — A5901 Trichomonal vulvovaginitis: Secondary | ICD-10-CM

## 2014-02-04 LAB — URINALYSIS, ROUTINE W REFLEX MICROSCOPIC
Bilirubin Urine: NEGATIVE
Glucose, UA: NEGATIVE mg/dL
Hgb urine dipstick: NEGATIVE
Ketones, ur: 40 mg/dL — AB
NITRITE: NEGATIVE
Protein, ur: NEGATIVE mg/dL
Specific Gravity, Urine: 1.031 — ABNORMAL HIGH (ref 1.005–1.030)
Urobilinogen, UA: 0.2 mg/dL (ref 0.0–1.0)
pH: 5.5 (ref 5.0–8.0)

## 2014-02-04 LAB — URINE MICROSCOPIC-ADD ON

## 2014-02-04 LAB — HIV ANTIBODY (ROUTINE TESTING W REFLEX): HIV: NONREACTIVE

## 2014-02-04 LAB — RPR

## 2014-02-04 LAB — POC URINE PREG, ED: PREG TEST UR: NEGATIVE

## 2014-02-04 MED ORDER — ALBUTEROL SULFATE (2.5 MG/3ML) 0.083% IN NEBU
5.0000 mg | INHALATION_SOLUTION | Freq: Once | RESPIRATORY_TRACT | Status: AC
  Administered 2014-02-04: 5 mg via RESPIRATORY_TRACT
  Filled 2014-02-04: qty 6

## 2014-02-04 MED ORDER — ONDANSETRON HCL 4 MG/2ML IJ SOLN
4.0000 mg | Freq: Once | INTRAMUSCULAR | Status: AC
  Administered 2014-02-04: 4 mg via INTRAVENOUS
  Filled 2014-02-04: qty 2

## 2014-02-04 MED ORDER — PROMETHAZINE HCL 25 MG PO TABS: 25.0000 mg | ORAL_TABLET | Freq: Four times a day (QID) | ORAL | Status: DC | PRN

## 2014-02-04 MED ORDER — DIPHENHYDRAMINE HCL 50 MG/ML IJ SOLN
12.5000 mg | Freq: Once | INTRAMUSCULAR | Status: AC
  Administered 2014-02-04: 12.5 mg via INTRAVENOUS
  Filled 2014-02-04: qty 1

## 2014-02-04 MED ORDER — METOCLOPRAMIDE HCL 5 MG/ML IJ SOLN
10.0000 mg | Freq: Once | INTRAMUSCULAR | Status: AC
  Administered 2014-02-04: 10 mg via INTRAVENOUS
  Filled 2014-02-04: qty 2

## 2014-02-04 MED ORDER — METRONIDAZOLE 500 MG PO TABS
2000.0000 mg | ORAL_TABLET | Freq: Once | ORAL | Status: AC
Start: 2014-02-04 — End: 2014-02-04
  Administered 2014-02-04: 2000 mg via ORAL
  Filled 2014-02-04: qty 4

## 2014-02-04 MED ORDER — SODIUM CHLORIDE 0.9 % IV BOLUS (SEPSIS)
1000.0000 mL | Freq: Once | INTRAVENOUS | Status: AC
Start: 2014-02-04 — End: 2014-02-04
  Administered 2014-02-04: 1000 mL via INTRAVENOUS

## 2014-02-04 MED ORDER — ONDANSETRON 4 MG PO TBDP
4.0000 mg | ORAL_TABLET | Freq: Once | ORAL | Status: AC
  Administered 2014-02-04: 4 mg via ORAL
  Filled 2014-02-04: qty 1

## 2014-02-04 MED ORDER — ONDANSETRON HCL 4 MG PO TABS: 4.0000 mg | ORAL_TABLET | Freq: Four times a day (QID) | ORAL | Status: DC

## 2014-02-04 MED ORDER — PROMETHAZINE HCL 25 MG/ML IJ SOLN
25.0000 mg | Freq: Once | INTRAMUSCULAR | Status: AC
  Administered 2014-02-04: 25 mg via INTRAMUSCULAR
  Filled 2014-02-04: qty 1

## 2014-02-04 MED ORDER — GI COCKTAIL ~~LOC~~
30.0000 mL | Freq: Once | ORAL | Status: AC
  Administered 2014-02-04: 30 mL via ORAL
  Filled 2014-02-04: qty 30

## 2014-02-04 NOTE — Discharge Instructions (Signed)

## 2014-02-04 NOTE — ED Notes (Signed)
Pelvic cart at the bedside 

## 2014-02-04 NOTE — ED Provider Notes (Signed)
CSN: [id number]     Arrival date & time 02/03/14  1914 History  This chart was scribed for Barbara FossaElizabeth Aceyn Kathol, MD by Bronson CurbJacqueline Melvin, ED Scribe. This patient was seen in room A01C/A01C and the patient's care was started at 12:47 AM.     Chief Complaint  Patient presents with  . Emesis    The history is provided by the patient. No language interpreter was used.      HPI Comments: Barbara Bruce is a 28 y.o. female, with history of asthma, who presents to the Emergency Department complaining of persistent vomiting for the past 3 days. Patient is unable to specify how many episodes of vomiting per day. She reports was hospitalized for an "ulcer" approximately 1 month ago for a week. She states she was placed on medications for her ulcers and is compliant. There is associated upper abdominal pain and nausea. She also notes fever last night but is not able remember the exact measurement. Patient reports she is not able to "keep anything down". No aggravating or alleviating factors. She denies diarrhea. Patient states she has an appointment with a GI specialist tomorrow. NKA to any medications.   Past Medical History  Diagnosis Date  . Ulcer   . Asthma    No past surgical history on file. No family history on file. History  Substance Use Topics  . Smoking status: Current Every Day Smoker    Types: Cigarettes  . Smokeless tobacco: Not on file  . Alcohol Use: Yes   OB History    No data available     Review of Systems  Constitutional: Positive for fever.  Gastrointestinal: Positive for nausea, vomiting and abdominal pain. Negative for diarrhea.  All other systems reviewed and are negative.     Allergies  Review of patient's allergies indicates no known allergies.  Home Medications   Prior to Admission medications   Medication Sig Start Date End Date Taking? Authorizing Provider  albuterol (PROVENTIL HFA;VENTOLIN HFA) 108 (90 BASE) MCG/ACT inhaler Inhale 2 puffs into the lungs  every 6 (six) hours as needed for wheezing.   Yes Historical Provider, MD  Multiple Vitamins-Minerals (ADULT GUMMY PO) Take 1 tablet by mouth daily.   Yes Historical Provider, MD  naproxen sodium (ALEVE) 220 MG tablet Take 220 mg by mouth daily as needed (pain).   Yes Historical Provider, MD  pantoprazole (PROTONIX) 20 MG tablet Take 1 tablet (20 mg total) by mouth daily. 03/26/13  Yes Teressa LowerVrinda Pickering, NP  HYDROcodone-acetaminophen (NORCO/VICODIN) 5-325 MG per tablet Take 1-2 tablets by mouth every 4 (four) hours as needed. Patient not taking: Reported on 02/03/2014 03/26/13   Teressa LowerVrinda Pickering, NP  promethazine (PHENERGAN) 25 MG tablet Take 1 tablet (25 mg total) by mouth every 6 (six) hours as needed for nausea or vomiting. Patient not taking: Reported on 02/03/2014 03/26/13   Teressa LowerVrinda Pickering, NP   Triage Vitals: BP 157/82 mmHg  Temp(Src) 98.6 F (37 C) (Oral)  Resp 16  Ht 4\' 9"  (1.448 m)  Wt 150 lb (68.04 kg)  BMI 32.45 kg/m2  SpO2 98%  LMP 01/05/2014  Physical Exam  Constitutional: She is oriented to person, place, and time. She appears well-developed and well-nourished.  HENT:  Head: Normocephalic and atraumatic.  Cardiovascular: Normal rate and regular rhythm.   No murmur heard. Pulmonary/Chest: Effort normal. No respiratory distress. She has wheezes.  Occasional wheezes.  Abdominal: Soft. There is tenderness in the epigastric area. There is no rebound and no guarding.  Moderate  epigastric tenderness without guarding or rebound.  Genitourinary:  Scant vaginal discharge, no CMT  Musculoskeletal: She exhibits no edema or tenderness.  Neurological: She is alert and oriented to person, place, and time.  Skin: Skin is warm and dry.  Psychiatric: She has a normal mood and affect. Her behavior is normal.  Nursing note and vitals reviewed.   ED Course  Procedures (including critical care time)  DIAGNOSTIC STUDIES: Oxygen Saturation is 98% on room air, normal by my  interpretation.    COORDINATION OF CARE: At 0052 Discussed treatment plan with patient which includes breathing treatment. Patient agrees.   Labs Review Labs Reviewed  URINALYSIS, ROUTINE W REFLEX MICROSCOPIC - Abnormal; Notable for the following:    APPearance CLOUDY (*)    Specific Gravity, Urine 1.031 (*)    Ketones, ur 40 (*)    Leukocytes, UA LARGE (*)    All other components within normal limits  URINE MICROSCOPIC-ADD ON - Abnormal; Notable for the following:    Squamous Epithelial / LPF MANY (*)    Bacteria, UA MANY (*)    All other components within normal limits  URINE CULTURE  GC/CHLAMYDIA PROBE AMP  CBC WITH DIFFERENTIAL  COMPREHENSIVE METABOLIC PANEL  LIPASE, BLOOD  RPR  HIV ANTIBODY (ROUTINE TESTING)  POC URINE PREG, ED    Imaging Review No results found.   EKG Interpretation None      MDM   Final diagnoses:  Non-intractable vomiting with nausea, vomiting of unspecified type  Trichomonas vaginalis (TV) infection    Patient here for evaluation of epigastric pain and vomiting. Patient did have vomiting in the emergency department which improved after IV fluids and treatment. UA is not consistent with UTI. Pelvic exam performed based off of Trichomonas in urine, a pelvic exam is not consistent with PID or cervicitis, cultures are pending. Patient was treated for Trichomonas with Flagyl 1. Presentation is not consistent with pancreatitis, cholecystitis, bowel obstruction. Patient was improved on recheck and tolerating oral fluids. Discussed with patient Trichomonas as well as vomiting and dehydration and need for PCP follow-up. Return precautions were discussed.  I personally performed the services described in this documentation, which was scribed in my presence. The recorded information has been reviewed and is accurate.    Barbara FossaElizabeth Briane Birden, MD 02/04/14 [phone]

## 2014-02-04 NOTE — ED Notes (Signed)
MD at the bedside at this time.

## 2014-02-05 LAB — URINE CULTURE

## 2014-02-05 LAB — GC/CHLAMYDIA PROBE AMP
CT Probe RNA: POSITIVE — AB
GC Probe RNA: NEGATIVE

## 2014-02-08 ENCOUNTER — Telehealth (HOSPITAL_BASED_OUTPATIENT_CLINIC_OR_DEPARTMENT_OTHER): Payer: Self-pay | Admitting: *Deleted

## 2014-02-10 ENCOUNTER — Telehealth: Payer: Self-pay | Admitting: Emergency Medicine

## 2014-02-12 ENCOUNTER — Telehealth (HOSPITAL_BASED_OUTPATIENT_CLINIC_OR_DEPARTMENT_OTHER): Payer: Self-pay | Admitting: *Deleted

## 2014-02-21 ENCOUNTER — Telehealth (HOSPITAL_COMMUNITY): Payer: Self-pay

## 2014-02-21 NOTE — ED Notes (Signed)
Unable to contact pt by mail or telephone. Unable to communicate lab results or treatment changes. 

## 2014-03-09 ENCOUNTER — Telehealth (HOSPITAL_BASED_OUTPATIENT_CLINIC_OR_DEPARTMENT_OTHER): Payer: Self-pay | Admitting: Emergency Medicine

## 2014-03-09 NOTE — Telephone Encounter (Signed)
Letter returned to sender, lost to followup

## 2014-06-02 ENCOUNTER — Emergency Department (HOSPITAL_COMMUNITY)
Admission: EM | Admit: 2014-06-02 | Discharge: 2014-06-02 | Disposition: A | Discharge status: Home / Self Care | Payer: Medicaid Other | Attending: Emergency Medicine | Admitting: Emergency Medicine

## 2014-06-02 ENCOUNTER — Emergency Department (HOSPITAL_COMMUNITY): Payer: Medicaid Other

## 2014-06-02 ENCOUNTER — Encounter (HOSPITAL_COMMUNITY): Payer: Self-pay | Admitting: Emergency Medicine

## 2014-06-02 DIAGNOSIS — K2970 Gastritis, unspecified, without bleeding: Secondary | ICD-10-CM | POA: Insufficient documentation

## 2014-06-02 DIAGNOSIS — R1013 Epigastric pain: Secondary | ICD-10-CM | POA: Diagnosis present

## 2014-06-02 DIAGNOSIS — Z720 Tobacco use: Secondary | ICD-10-CM | POA: Diagnosis not present

## 2014-06-02 DIAGNOSIS — R109 Unspecified abdominal pain: Secondary | ICD-10-CM

## 2014-06-02 DIAGNOSIS — J45909 Unspecified asthma, uncomplicated: Secondary | ICD-10-CM | POA: Insufficient documentation

## 2014-06-02 DIAGNOSIS — Z79899 Other long term (current) drug therapy: Secondary | ICD-10-CM | POA: Diagnosis not present

## 2014-06-02 DIAGNOSIS — Z3202 Encounter for pregnancy test, result negative: Secondary | ICD-10-CM | POA: Insufficient documentation

## 2014-06-02 LAB — URINE MICROSCOPIC-ADD ON

## 2014-06-02 LAB — CBC WITH DIFFERENTIAL/PLATELET
BASOS PCT: 0 % (ref 0–1)
Basophils Absolute: 0.1 10*3/uL (ref 0.0–0.1)
EOS ABS: 0.4 10*3/uL (ref 0.0–0.7)
EOS PCT: 4 % (ref 0–5)
HCT: 43 % (ref 36.0–46.0)
Hemoglobin: 14.4 g/dL (ref 12.0–15.0)
Lymphocytes Relative: 29 % (ref 12–46)
Lymphs Abs: 3.5 10*3/uL (ref 0.7–4.0)
MCH: 29.6 pg (ref 26.0–34.0)
MCHC: 33.5 g/dL (ref 30.0–36.0)
MCV: 88.5 fL (ref 78.0–100.0)
MONOS PCT: 5 % (ref 3–12)
Monocytes Absolute: 0.6 10*3/uL (ref 0.1–1.0)
NEUTROS PCT: 62 % (ref 43–77)
Neutro Abs: 7.6 10*3/uL (ref 1.7–7.7)
PLATELETS: 296 10*3/uL (ref 150–400)
RBC: 4.86 MIL/uL (ref 3.87–5.11)
RDW: 13.5 % (ref 11.5–15.5)
WBC: 12.2 10*3/uL — ABNORMAL HIGH (ref 4.0–10.5)

## 2014-06-02 LAB — I-STAT BETA HCG BLOOD, ED (MC, WL, AP ONLY): I-stat hCG, quantitative: <5 m[IU]/mL (ref <5)

## 2014-06-02 LAB — URINALYSIS, ROUTINE W REFLEX MICROSCOPIC
Bilirubin Urine: NEGATIVE
GLUCOSE, UA: NEGATIVE mg/dL
KETONES UR: 15 mg/dL — AB
NITRITE: NEGATIVE
PROTEIN: 100 mg/dL — AB
Specific Gravity, Urine: 1.025 (ref 1.005–1.030)
Urobilinogen, UA: 0.2 mg/dL (ref 0.0–1.0)
pH: 6.5 (ref 5.0–8.0)

## 2014-06-02 LAB — COMPREHENSIVE METABOLIC PANEL
ALT: 19 U/L (ref 0–35)
AST: 22 U/L (ref 0–37)
Albumin: 4.5 g/dL (ref 3.5–5.2)
Alkaline Phosphatase: 62 U/L (ref 39–117)
Anion gap: 10 (ref 5–15)
BILIRUBIN TOTAL: 0.6 mg/dL (ref 0.3–1.2)
BUN: 6 mg/dL (ref 6–23)
CO2: 23 mmol/L (ref 19–32)
CREATININE: 0.61 mg/dL (ref 0.50–1.10)
Calcium: 9 mg/dL (ref 8.4–10.5)
Chloride: 107 mmol/L (ref 96–112)
GFR calc Af Amer: >90 mL/min (ref >90)
GLUCOSE: 104 mg/dL — AB (ref 70–99)
Potassium: 3.6 mmol/L (ref 3.5–5.1)
SODIUM: 140 mmol/L (ref 135–145)
Total Protein: 8.2 g/dL (ref 6.0–8.3)

## 2014-06-02 LAB — POC URINE PREG, ED: Preg Test, Ur: NEGATIVE

## 2014-06-02 LAB — LIPASE, BLOOD: Lipase: 31 U/L (ref 11–59)

## 2014-06-02 MED ORDER — FAMOTIDINE 20 MG PO TABS: 20.0000 mg | ORAL_TABLET | Freq: Two times a day (BID) | ORAL | Status: DC

## 2014-06-02 MED ORDER — PANTOPRAZOLE SODIUM 40 MG IV SOLR
40.0000 mg | Freq: Once | INTRAVENOUS | Status: AC
  Administered 2014-06-02: 40 mg via INTRAVENOUS
  Filled 2014-06-02: qty 40

## 2014-06-02 MED ORDER — SODIUM CHLORIDE 0.9 % IV BOLUS (SEPSIS)
1000.0000 mL | Freq: Once | INTRAVENOUS | Status: AC
  Administered 2014-06-02: 1000 mL via INTRAVENOUS

## 2014-06-02 MED ORDER — LORAZEPAM 2 MG/ML IJ SOLN
0.5000 mg | Freq: Once | INTRAMUSCULAR | Status: AC
  Administered 2014-06-02: 0.5 mg via INTRAVENOUS
  Filled 2014-06-02: qty 1

## 2014-06-02 MED ORDER — DIPHENHYDRAMINE HCL 50 MG/ML IJ SOLN
25.0000 mg | Freq: Once | INTRAMUSCULAR | Status: AC
  Administered 2014-06-02: 25 mg via INTRAVENOUS
  Filled 2014-06-02: qty 1

## 2014-06-02 MED ORDER — ONDANSETRON HCL 4 MG/2ML IJ SOLN
4.0000 mg | Freq: Once | INTRAMUSCULAR | Status: AC
  Administered 2014-06-02: 4 mg via INTRAVENOUS
  Filled 2014-06-02: qty 2

## 2014-06-02 MED ORDER — METOCLOPRAMIDE HCL 5 MG/ML IJ SOLN
5.0000 mg | Freq: Once | INTRAMUSCULAR | Status: AC
  Administered 2014-06-02: 5 mg via INTRAVENOUS
  Filled 2014-06-02: qty 2

## 2014-06-02 MED ORDER — SUCRALFATE 1 G PO TABS: 1.0000 g | ORAL_TABLET | Freq: Four times a day (QID) | ORAL | Status: DC

## 2014-06-02 MED ORDER — HYDROMORPHONE HCL 1 MG/ML IJ SOLN
1.0000 mg | Freq: Once | INTRAMUSCULAR | Status: AC
  Administered 2014-06-02: 1 mg via INTRAVENOUS
  Filled 2014-06-02: qty 1

## 2014-06-02 MED ORDER — OXYCODONE-ACETAMINOPHEN 5-325 MG PO TABS: 1.0000 | ORAL_TABLET | ORAL | Status: DC | PRN

## 2014-06-02 MED ORDER — SODIUM CHLORIDE 0.9 % IV SOLN: INTRAVENOUS | Status: DC

## 2014-06-02 NOTE — ED Notes (Signed)
Ultrasound at bedside

## 2014-06-02 NOTE — ED Provider Notes (Signed)
CSN: [id number]     Arrival date & time 06/02/14  1500 History   First MD Initiated Contact with Patient 06/02/14 [phone]     Chief Complaint  Patient presents with  . Abdominal Pain  . Nausea  . Emesis     (Consider location/radiation/quality/duration/timing/severity/associated sxs/prior Treatment) HPI Comments: Pt with long standing h/o gerd and PUD, drank etoh and ate tacos last pm--sx started this am  Patient is a 29 y.o. female presenting with abdominal pain and vomiting. The history is provided by the patient.  Abdominal Pain Pain location:  Epigastric Pain quality: burning   Pain radiates to:  RUQ Pain severity:  Severe Onset quality:  Sudden Duration:  12 hours Timing:  Constant Progression:  Worsening Chronicity:  Recurrent Context: alcohol use and eating   Relieved by:  Nothing Worsened by:  Nothing tried Ineffective treatments:  None tried Associated symptoms: belching, melena, nausea and vomiting   Associated symptoms: no diarrhea and no fever   Emesis Associated symptoms: abdominal pain   Associated symptoms: no diarrhea     Past Medical History  Diagnosis Date  . Ulcer   . Asthma    No past surgical history on file. No family history on file. History  Substance Use Topics  . Smoking status: Current Every Day Smoker    Types: Cigarettes  . Smokeless tobacco: Not on file  . Alcohol Use: Yes   OB History    No data available     Review of Systems  Constitutional: Negative for fever.  Gastrointestinal: Positive for nausea, vomiting, abdominal pain and melena. Negative for diarrhea.  All other systems reviewed and are negative.     Allergies  Review of patient's allergies indicates no known allergies.  Home Medications   Prior to Admission medications   Medication Sig Start Date End Date Taking? Authorizing Provider  albuterol (PROVENTIL HFA;VENTOLIN HFA) 108 (90 BASE) MCG/ACT inhaler Inhale 2 puffs into the lungs every 6 (six) hours as  needed for wheezing.    Historical Provider, MD  HYDROcodone-acetaminophen (NORCO/VICODIN) 5-325 MG per tablet Take 1-2 tablets by mouth every 4 (four) hours as needed. Patient not taking: Reported on 02/03/2014 03/26/13   Teressa LowerVrinda Pickering, NP  Multiple Vitamins-Minerals (ADULT GUMMY PO) Take 1 tablet by mouth daily.    Historical Provider, MD  naproxen sodium (ALEVE) 220 MG tablet Take 220 mg by mouth daily as needed (pain).    Historical Provider, MD  ondansetron (ZOFRAN) 4 MG tablet Take 1 tablet (4 mg total) by mouth every 6 (six) hours. 02/04/14   Tilden FossaElizabeth Rees, MD  pantoprazole (PROTONIX) 20 MG tablet Take 1 tablet (20 mg total) by mouth daily. 03/26/13   Teressa LowerVrinda Pickering, NP  promethazine (PHENERGAN) 25 MG tablet Take 1 tablet (25 mg total) by mouth every 6 (six) hours as needed for nausea or vomiting. Patient not taking: Reported on 02/03/2014 03/26/13   Teressa LowerVrinda Pickering, NP  promethazine (PHENERGAN) 25 MG tablet Take 1 tablet (25 mg total) by mouth every 6 (six) hours as needed for nausea or vomiting. 02/04/14   Tilden FossaElizabeth Rees, MD   BP 164/109 mmHg  Pulse 92  Temp(Src) 98.5 F (36.9 C) (Oral)  Resp 16  SpO2 96%  LMP 06/02/2014 Physical Exam  Constitutional: She is oriented to person, place, and time. She appears well-developed and well-nourished.  Non-toxic appearance. No distress.  HENT:  Head: Normocephalic and atraumatic.  Eyes: Conjunctivae, EOM and lids are normal. Pupils are equal, round, and reactive to light.  Neck: Normal range of motion. Neck supple. No tracheal deviation present. No thyroid mass present.  Cardiovascular: Normal rate, regular rhythm and normal heart sounds.  Exam reveals no gallop.   No murmur heard. Pulmonary/Chest: Effort normal and breath sounds normal. No stridor. No respiratory distress. She has no decreased breath sounds. She has no wheezes. She has no rhonchi. She has no rales.  Abdominal: Soft. Normal appearance and bowel sounds are normal. She  exhibits no distension. There is tenderness in the right upper quadrant and epigastric area. There is no rigidity, no rebound, no guarding and no CVA tenderness.  Musculoskeletal: Normal range of motion. She exhibits no edema or tenderness.  Neurological: She is alert and oriented to person, place, and time. She has normal strength. No cranial nerve deficit or sensory deficit. GCS eye subscore is 4. GCS verbal subscore is 5. GCS motor subscore is 6.  Skin: Skin is warm and dry. No abrasion and no rash noted.  Psychiatric: She has a normal mood and affect. Her speech is normal and behavior is normal.  Nursing note and vitals reviewed.   ED Course  Procedures (including critical care time) Labs Review Labs Reviewed  CBC WITH DIFFERENTIAL/PLATELET - Abnormal; Notable for the following:    WBC 12.2 (*)    All other components within normal limits  COMPREHENSIVE METABOLIC PANEL - Abnormal; Notable for the following:    Glucose, Bld 104 (*)    All other components within normal limits  LIPASE, BLOOD  URINALYSIS, ROUTINE W REFLEX MICROSCOPIC  POC URINE PREG, ED  I-STAT BETA HCG BLOOD, ED ([facility], [facility], [facility] ONLY)    Imaging Review No results found.   EKG Interpretation None      MDM   Final diagnoses:  Abdominal pain    Patient given IV fluids and medications for vomiting. Also given Protonix for suspected gastritis. Patient's abdominal ultrasound negative for gallstones. Suspect that she does have worsening PUD which was exacerbated by alcohol as well as spicy food intake. Will be placed on medications and she is stable for discharge    Lorre Nick, MD 06/02/14 1949

## 2014-06-02 NOTE — ED Notes (Signed)
Pt c/o NV and mid abd pain (burning since this morning).

## 2014-06-02 NOTE — ED Notes (Signed)
Awake. Verbally responsive. Resp even and unlabored. No audible adventitious breath sounds noted. ABC's intact. Abd soft/nondistended but tender to palpate. BS (+) and active x4 quadrants. Noted to have n/v x2 episodes. Family at bedside. IV patent and intact infusing NS at 99299ml/hr without difficulty.

## 2014-06-02 NOTE — Discharge Instructions (Signed)

## 2014-06-02 NOTE — ED Notes (Signed)
Awake. Verbally responsive. Resp even and unlabored. No audible adventitious breath sounds noted. ABC's intact. Abd soft/nondistended but tender to palpate in RUQ. BS (+) and active x4 quadrants. Pt reported having n/v but no diarrhea at this time.

## 2014-10-21 ENCOUNTER — Encounter (HOSPITAL_COMMUNITY): Payer: Self-pay | Admitting: Emergency Medicine

## 2014-10-21 ENCOUNTER — Emergency Department (HOSPITAL_COMMUNITY)
Admission: EM | Admit: 2014-10-21 | Discharge: 2014-10-21 | Disposition: A | Discharge status: Home / Self Care | Payer: Medicaid Other | Attending: Emergency Medicine | Admitting: Emergency Medicine

## 2014-10-21 DIAGNOSIS — A599 Trichomoniasis, unspecified: Secondary | ICD-10-CM | POA: Diagnosis not present

## 2014-10-21 DIAGNOSIS — G43A1 Cyclical vomiting, intractable: Secondary | ICD-10-CM | POA: Insufficient documentation

## 2014-10-21 DIAGNOSIS — Z3202 Encounter for pregnancy test, result negative: Secondary | ICD-10-CM | POA: Diagnosis not present

## 2014-10-21 DIAGNOSIS — K279 Peptic ulcer, site unspecified, unspecified as acute or chronic, without hemorrhage or perforation: Secondary | ICD-10-CM

## 2014-10-21 DIAGNOSIS — Z720 Tobacco use: Secondary | ICD-10-CM | POA: Diagnosis not present

## 2014-10-21 DIAGNOSIS — J45909 Unspecified asthma, uncomplicated: Secondary | ICD-10-CM | POA: Insufficient documentation

## 2014-10-21 DIAGNOSIS — R112 Nausea with vomiting, unspecified: Secondary | ICD-10-CM | POA: Diagnosis present

## 2014-10-21 DIAGNOSIS — Z79899 Other long term (current) drug therapy: Secondary | ICD-10-CM | POA: Insufficient documentation

## 2014-10-21 DIAGNOSIS — R1115 Cyclical vomiting syndrome unrelated to migraine: Secondary | ICD-10-CM

## 2014-10-21 LAB — LIPASE, BLOOD: Lipase: 29 U/L (ref 22–51)

## 2014-10-21 LAB — BASIC METABOLIC PANEL WITH GFR
Anion gap: 13 (ref 5–15)
BUN: <5 mg/dL — ABNORMAL LOW (ref 6–20)
CO2: 22 mmol/L (ref 22–32)
Calcium: 9.2 mg/dL (ref 8.9–10.3)
Chloride: 104 mmol/L (ref 101–111)
Creatinine, Ser: 0.6 mg/dL (ref 0.44–1.00)
GFR calc Af Amer: >60 mL/min
GFR calc non Af Amer: >60 mL/min
Glucose, Bld: 142 mg/dL — ABNORMAL HIGH (ref 65–99)
Potassium: 3.3 mmol/L — ABNORMAL LOW (ref 3.5–5.1)
Sodium: 139 mmol/L (ref 135–145)

## 2014-10-21 LAB — HEPATIC FUNCTION PANEL
ALBUMIN: 4.4 g/dL (ref 3.5–5.0)
ALT: 21 U/L (ref 14–54)
AST: 25 U/L (ref 15–41)
Alkaline Phosphatase: 59 U/L (ref 38–126)
Bilirubin, Direct: <0.1 mg/dL — ABNORMAL LOW (ref 0.1–0.5)
Total Bilirubin: 0.5 mg/dL (ref 0.3–1.2)
Total Protein: 7.9 g/dL (ref 6.5–8.1)

## 2014-10-21 LAB — URINALYSIS, ROUTINE W REFLEX MICROSCOPIC
Bilirubin Urine: NEGATIVE
Bilirubin Urine: NEGATIVE
Glucose, UA: NEGATIVE mg/dL
Glucose, UA: NEGATIVE mg/dL
Hgb urine dipstick: NEGATIVE
Hgb urine dipstick: NEGATIVE
Ketones, ur: NEGATIVE mg/dL
Ketones, ur: 15 mg/dL — AB
Nitrite: NEGATIVE
Nitrite: NEGATIVE
Protein, ur: NEGATIVE mg/dL
Protein, ur: NEGATIVE mg/dL
Specific Gravity, Urine: 1.009 (ref 1.005–1.030)
Specific Gravity, Urine: 1.018 (ref 1.005–1.030)
Urobilinogen, UA: 0.2 mg/dL (ref 0.0–1.0)
Urobilinogen, UA: 0.2 mg/dL (ref 0.0–1.0)
pH: 6 (ref 5.0–8.0)
pH: 6.5 (ref 5.0–8.0)

## 2014-10-21 LAB — CBC WITH DIFFERENTIAL/PLATELET
Basophils Absolute: 0 K/uL (ref 0.0–0.1)
Basophils Relative: 0 % (ref 0–1)
Eosinophils Absolute: 0.1 K/uL (ref 0.0–0.7)
Eosinophils Relative: 1 % (ref 0–5)
HCT: 43.6 % (ref 36.0–46.0)
Hemoglobin: 14.7 g/dL (ref 12.0–15.0)
Lymphocytes Relative: 19 % (ref 12–46)
Lymphs Abs: 3.2 K/uL (ref 0.7–4.0)
MCH: 29.8 pg (ref 26.0–34.0)
MCHC: 33.7 g/dL (ref 30.0–36.0)
MCV: 88.4 fL (ref 78.0–100.0)
Monocytes Absolute: 0.7 K/uL (ref 0.1–1.0)
Monocytes Relative: 4 % (ref 3–12)
Neutro Abs: 13.1 K/uL — ABNORMAL HIGH (ref 1.7–7.7)
Neutrophils Relative %: 76 % (ref 43–77)
Platelets: 275 K/uL (ref 150–400)
RBC: 4.93 MIL/uL (ref 3.87–5.11)
RDW: 14 % (ref 11.5–15.5)
WBC: 17.2 K/uL — ABNORMAL HIGH (ref 4.0–10.5)

## 2014-10-21 LAB — WET PREP, GENITAL
Clue Cells Wet Prep HPF POC: NONE SEEN
Trich, Wet Prep: NONE SEEN
WBC, Wet Prep HPF POC: NONE SEEN
Yeast Wet Prep HPF POC: NONE SEEN

## 2014-10-21 LAB — URINE MICROSCOPIC-ADD ON

## 2014-10-21 LAB — PREGNANCY, URINE: Preg Test, Ur: NEGATIVE

## 2014-10-21 MED ORDER — ONDANSETRON HCL 4 MG/2ML IJ SOLN
4.0000 mg | Freq: Once | INTRAMUSCULAR | Status: AC
  Administered 2014-10-21: 4 mg via INTRAVENOUS
  Filled 2014-10-21: qty 2

## 2014-10-21 MED ORDER — HALOPERIDOL LACTATE 5 MG/ML IJ SOLN
2.0000 mg | Freq: Once | INTRAMUSCULAR | Status: AC
  Administered 2014-10-21: 2 mg via INTRAVENOUS
  Filled 2014-10-21: qty 1

## 2014-10-21 MED ORDER — LORAZEPAM 2 MG/ML IJ SOLN
1.0000 mg | Freq: Once | INTRAMUSCULAR | Status: AC
  Administered 2014-10-21: 1 mg via INTRAVENOUS
  Filled 2014-10-21: qty 1

## 2014-10-21 MED ORDER — FAMOTIDINE IN NACL 20-0.9 MG/50ML-% IV SOLN
20.0000 mg | Freq: Once | INTRAVENOUS | Status: AC
  Administered 2014-10-21: 20 mg via INTRAVENOUS
  Filled 2014-10-21: qty 50

## 2014-10-21 MED ORDER — MORPHINE SULFATE (PF) 4 MG/ML IV SOLN
4.0000 mg | Freq: Once | INTRAVENOUS | Status: AC
  Administered 2014-10-21: 4 mg via INTRAVENOUS
  Filled 2014-10-21: qty 1

## 2014-10-21 MED ORDER — METRONIDAZOLE 500 MG PO TABS: 500.0000 mg | ORAL_TABLET | Freq: Two times a day (BID) | ORAL | Status: DC

## 2014-10-21 MED ORDER — ONDANSETRON HCL 4 MG PO TABS: 4.0000 mg | ORAL_TABLET | Freq: Four times a day (QID) | ORAL | Status: DC

## 2014-10-21 MED ORDER — PANTOPRAZOLE SODIUM 40 MG IV SOLR
40.0000 mg | Freq: Once | INTRAVENOUS | Status: AC
  Administered 2014-10-21: 40 mg via INTRAVENOUS
  Filled 2014-10-21: qty 40

## 2014-10-21 MED ORDER — GI COCKTAIL ~~LOC~~
30.0000 mL | Freq: Once | ORAL | Status: DC
  Filled 2014-10-21: qty 30

## 2014-10-21 MED ORDER — SODIUM CHLORIDE 0.9 % IV BOLUS (SEPSIS)
1000.0000 mL | Freq: Once | INTRAVENOUS | Status: AC
  Administered 2014-10-21: 1000 mL via INTRAVENOUS

## 2014-10-21 NOTE — ED Notes (Signed)
Pt. Stated, I started having burning on my stomach with N/V

## 2014-10-21 NOTE — Discharge Instructions (Signed)
Food Choices for Peptic Ulcer Disease When you have peptic ulcer disease, the foods you eat and your eating habits are very important. Choosing the right foods can help ease the discomfort of peptic ulcer disease. WHAT GENERAL GUIDELINES DO I NEED TO FOLLOW?  Choose fruits, vegetables, whole grains, and low-fat meat, fish, and poultry.   Keep a food diary to identify foods that cause symptoms.  Avoid foods that cause irritation or pain. These may be different for different people.  Eat frequent small meals instead of three large meals each day. The pain may be worse when your stomach is empty.  Avoid eating close to bedtime. WHAT FOODS ARE NOT RECOMMENDED? The following are some foods and drinks that may worsen your symptoms:  Black, white, and red pepper.  Hot sauce.  Chili peppers.  Chili powder.  Chocolate and cocoa.   Alcohol.  Tea, coffee, and cola (regular and decaffeinated). The items listed above may not be a complete list of foods and beverages to avoid. Contact your dietitian for more information. Document Released: 04/20/2011 Document Revised: 01/31/2013 Document Reviewed: 11/30/2012 [facility] Patient Information 2015 [place], [place]. This information is not intended to replace advice given to you by your health care provider. Make sure you discuss any questions you have with your health care provider.  Peptic Ulcer A peptic ulcer is a sore in the lining of your esophagus (esophageal ulcer), stomach (gastric ulcer), or in the first part of your small intestine (duodenal ulcer). The ulcer causes erosion into the deeper tissue. CAUSES  Normally, the lining of the stomach and the small intestine protects itself from the acid that digests food. The protective lining can be damaged by:  An infection caused by a bacterium called Helicobacter pylori (H. pylori).  Regular use of nonsteroidal anti-inflammatory drugs (NSAIDs), such as ibuprofen or aspirin.  Smoking  tobacco. Other risk factors include being older than 50, drinking alcohol excessively, and having a family history of ulcer disease.  SYMPTOMS   Burning pain or gnawing in the area between the chest and the belly button.  Heartburn.  Nausea and vomiting.  Bloating. The pain can be worse on an empty stomach and at night. If the ulcer results in bleeding, it can cause:  Black, tarry stools.  Vomiting of bright red blood.  Vomiting of coffee-ground-looking materials. DIAGNOSIS  A diagnosis is usually made based upon your history and an exam. Other tests and procedures may be performed to find the cause of the ulcer. Finding a cause will help determine the best treatment. Tests and procedures may include:  Blood tests, stool tests, or breath tests to check for the bacterium H. pylori.  An upper gastrointestinal (GI) series of the esophagus, stomach, and small intestine.  An endoscopy to examine the esophagus, stomach, and small intestine.  A biopsy. TREATMENT  Treatment may include:  Eliminating the cause of the ulcer, such as smoking, NSAIDs, or alcohol.  Medicines to reduce the amount of acid in your digestive tract.  Antibiotic medicines if the ulcer is caused by the H. pylori bacterium.  An upper endoscopy to treat a bleeding ulcer.  Surgery if the bleeding is severe or if the ulcer created a hole somewhere in the digestive system. HOME CARE INSTRUCTIONS   Avoid tobacco, alcohol, and caffeine. Smoking can increase the acid in the stomach, and continued smoking will impair the healing of ulcers.  Avoid foods and drinks that seem to cause discomfort or aggravate your ulcer.  Only take medicines  as directed by your caregiver. Do not substitute over-the-counter medicines for prescription medicines without talking to your caregiver.  Keep any follow-up appointments and tests as directed. SEEK MEDICAL CARE IF:   Your do not improve within 7 days of starting  treatment.  You have ongoing indigestion or heartburn. SEEK IMMEDIATE MEDICAL CARE IF:   You have sudden, sharp, or persistent abdominal pain.  You have bloody or dark black, tarry stools.  You vomit blood or vomit that looks like coffee grounds.  You become light-headed, weak, or feel faint.  You become sweaty or clammy. MAKE SURE YOU:   Understand these instructions.  Will watch your condition.  Will get help right away if you are not doing well or get worse. Document Released: 01/24/2000 Document Revised: 06/12/2013 Document Reviewed: 08/26/2011 [facility] Patient Information 2015 [place], [place]. This information is not intended to replace advice given to you by your health care provider. Make sure you discuss any questions you have with your health care provider.  Recommend immediate follow up with GI. Return to Emergency Department if vomiting persists or you see blood in vomit, fevers, chills, dysuria, occur.   Trichomoniasis Trichomoniasis is an infection caused by an organism called Trichomonas. The infection can affect both women and men. In women, the outer female genitalia and the vagina are affected. In men, the penis is mainly affected, but the prostate and other reproductive organs can also be involved. Trichomoniasis is a sexually transmitted infection (STI) and is most often passed to another person through sexual contact.  RISK FACTORS  Having unprotected sexual intercourse.  Having sexual intercourse with an infected partner. SIGNS AND SYMPTOMS  Symptoms of trichomoniasis in women include:  Abnormal gray-green frothy vaginal discharge.  Itching and irritation of the vagina.  Itching and irritation of the area outside the vagina. Symptoms of trichomoniasis in men include:   Penile discharge with or without pain.  Pain during urination. This results from inflammation of the urethra. DIAGNOSIS  Trichomoniasis may be found during a Pap test or physical  exam. Your health care provider may use one of the following methods to help diagnose this infection:  Examining vaginal discharge under a microscope. For men, urethral discharge would be examined.  Testing the pH of the vagina with a test tape.  Using a vaginal swab test that checks for the Trichomonas organism. A test is available that provides results within a few minutes.  Doing a culture test for the organism. This is not usually needed. TREATMENT   You may be given medicine to fight the infection. Women should inform their health care provider if they could be or are pregnant. Some medicines used to treat the infection should not be taken during pregnancy.  Your health care provider may recommend over-the-counter medicines or creams to decrease itching or irritation.  Your sexual partner will need to be treated if infected. HOME CARE INSTRUCTIONS   Take medicines only as directed by your health care provider.  Take over-the-counter medicine for itching or irritation as directed by your health care provider.  Do not have sexual intercourse while you have the infection.  Women should not douche or wear tampons while they have the infection.  Discuss your infection with your partner. Your partner may have gotten the infection from you, or you may have gotten it from your partner.  Have your sex partner get examined and treated if necessary.  Practice safe, informed, and protected sex.  See your health care provider for other  STI testing. SEEK MEDICAL CARE IF:   You still have symptoms after you finish your medicine.  You develop abdominal pain.  You have pain when you urinate.  You have bleeding after sexual intercourse.  You develop a rash.  Your medicine makes you sick or makes you throw up (vomit). MAKE SURE YOU:  Understand these instructions.  Will watch your condition.  Will get help right away if you are not doing well or get worse. Document Released:  07/22/2000 Document Revised: 06/12/2013 Document Reviewed: 11/07/2012 [facility] Patient Information 2015 [place], [place]. This information is not intended to replace advice given to you by your health care provider. Make sure you discuss any questions you have with your health care provider.

## 2014-10-21 NOTE — ED Provider Notes (Signed)
CSN: [id number]     Arrival date & time 10/21/14  0947 History   First MD Initiated Contact with Patient 10/21/14 1015     Chief Complaint  Patient presents with  . Abdominal Pain  . Emesis  . Nausea     (Consider location/radiation/quality/duration/timing/severity/associated sxs/prior Treatment) HPI Comments: Barbara Bruce is a 29 y.o F with a long history of GERD and peptic ulcer disease who presents to the emergency department today for abdominal pain and vomiting. Patient states that last night she ate spicy chicken feet and subsequently began vomiting. Emesis is nonbloody and nonbilious. Patient states that she has been nonstop vomiting since last night. She is also complaining of a burning sensation in her epigastric region. Patient has experienced this several times in the past which required a trip to the emergency department. Per the patient she has had multiple endoscopies for her PUD. Denies diarrhea, melena, hematochezia, fever, chills, flank pain, dysuria, hematuria, vaginal discharge or pruritus, CP, SOB.   Patient is a 29 y.o. female presenting with abdominal pain and vomiting. The history is provided by the patient.  Abdominal Pain Associated symptoms: vomiting   Emesis Associated symptoms: abdominal pain     Past Medical History  Diagnosis Date  . Ulcer   . Asthma    History reviewed. No pertinent past surgical history. No family history on file. Social History  Substance Use Topics  . Smoking status: Current Every Day Smoker    Types: Cigarettes  . Smokeless tobacco: None  . Alcohol Use: Yes   OB History    No data available     Review of Systems  Gastrointestinal: Positive for vomiting and abdominal pain.  All other systems reviewed and are negative.     Allergies  Review of patient's allergies indicates no known allergies.  Home Medications   Prior to Admission medications   Medication Sig Start Date End Date Taking? Authorizing Provider   albuterol (PROVENTIL HFA;VENTOLIN HFA) 108 (90 BASE) MCG/ACT inhaler Inhale 2 puffs into the lungs every 6 (six) hours as needed for wheezing (wheezing).    Yes Historical Provider, MD  famotidine (PEPCID) 20 MG tablet Take 1 tablet (20 mg total) by mouth 2 (two) times daily. 06/02/14  Yes Lorre Nick, MD  naproxen sodium (ALEVE) 220 MG tablet Take 440 mg by mouth daily as needed (pain).    Yes Historical Provider, MD  ondansetron (ZOFRAN) 4 MG tablet Take 1 tablet (4 mg total) by mouth every 6 (six) hours. 02/04/14  Yes Tilden Fossa, MD  promethazine (PHENERGAN) 25 MG tablet Take 1 tablet (25 mg total) by mouth every 6 (six) hours as needed for nausea or vomiting. 03/26/13  Yes Teressa Lower, NP  promethazine (PHENERGAN) 25 MG tablet Take 1 tablet (25 mg total) by mouth every 6 (six) hours as needed for nausea or vomiting. Patient not taking: Reported on 06/02/2014 02/04/14   Tilden Fossa, MD   BP 126/77 mmHg  Pulse 89  Temp(Src) 98.7 F (37.1 C)  Resp 16  Ht 4\' 9"  (1.448 m)  Wt 150 lb (68.04 kg)  BMI 32.45 kg/m2  SpO2 95%  LMP 09/12/2014 Physical Exam  Constitutional: She is oriented to person, place, and time. She appears well-developed and well-nourished. She appears distressed.  Patient actively vomiting in room.  HENT:  Head: Normocephalic and atraumatic.  Mouth/Throat: No oropharyngeal exudate.  Eyes: Conjunctivae and EOM are normal. Pupils are equal, round, and reactive to light. Right eye exhibits no discharge. Left eye  exhibits no discharge. No scleral icterus.  Cardiovascular: Normal rate, regular rhythm, normal heart sounds and intact distal pulses.  Exam reveals no gallop and no friction rub.   No murmur heard. Pulmonary/Chest: Effort normal and breath sounds normal. No respiratory distress. She has no wheezes. She has no rales. She exhibits no tenderness.  Abdominal: Soft. Bowel sounds are normal. She exhibits no distension and no mass. There is tenderness ( TTP of  epigastric region). There is no rebound and no guarding.  Genitourinary: Vagina normal.  No CMT. Appropriate amount of discharge in vaginal vault. No external lesions. Cervical os closed.   Musculoskeletal: Normal range of motion. She exhibits no edema or tenderness.  Neurological: She is alert and oriented to person, place, and time. No cranial nerve deficit.  Skin: Skin is warm and dry. No rash noted. She is not diaphoretic. No erythema. No pallor.  Psychiatric: She has a normal mood and affect. Her behavior is normal.  Nursing note and vitals reviewed.   ED Course  Procedures (including critical care time)  Trichomonas seen and urine as well as yeast. Will Perform pelvic exam and wet prep as well as GC and chlamydia swabs.  No trich seen on wet prep. Will repeat UA. Suspect that original UA was either contaminated or results did not belong to patient.   Labs Review Labs Reviewed  URINALYSIS, ROUTINE W REFLEX MICROSCOPIC (NOT AT [facility]) - Abnormal; Notable for the following:    APPearance CLOUDY (*)    Leukocytes, UA TRACE (*)    All other components within normal limits  CBC WITH DIFFERENTIAL/PLATELET - Abnormal; Notable for the following:    WBC 17.2 (*)    Neutro Abs 13.1 (*)    All other components within normal limits  BASIC METABOLIC PANEL - Abnormal; Notable for the following:    Potassium 3.3 (*)    Glucose, Bld 142 (*)    BUN <5 (*)    All other components within normal limits  HEPATIC FUNCTION PANEL - Abnormal; Notable for the following:    Bilirubin, Direct <0.1 (*)    All other components within normal limits  URINE MICROSCOPIC-ADD ON - Abnormal; Notable for the following:    Squamous Epithelial / LPF FEW (*)    All other components within normal limits  WET PREP, GENITAL  URINE CULTURE  PREGNANCY, URINE  LIPASE, BLOOD  HIV ANTIBODY (ROUTINE TESTING)  URINALYSIS, ROUTINE W REFLEX MICROSCOPIC (NOT AT [facility])  GC/CHLAMYDIA PROBE AMP ([place]) NOT AT [facility]     Imaging Review No results found. I have personally reviewed and evaluated these images and lab results as part of my medical decision-making.   EKG Interpretation None      MDM   Final diagnoses:  PUD (peptic ulcer disease)  Intractable cyclical vomiting with nausea  Trichomonas infection    Pt seen for intractable vomiting after eating spicy food. Long history of PUD and GERD. Pt given IV fluids, IV protonix, zofran, morphine, and ativan. Pt still experienced vomiting. Pt given IV pepcid and haldol. Vomiting subsided. Fluid challenge given, passed.  Trichomonas found in urine. Will treat with flagyl, recommend taking with zofran as to avoid nausea.   Strongly recommend follow up with gastroenterology.  Return precautions outlined in patient discharge instructions. Pt stable for discharge.  Patient was discussed with and seen by Dr. Silverio Lay who agrees with the treatment plan.      Lester Kinsman [place], PA-C 10/21/14 1633  Dub Mikes, PA-C 10/22/14  1610  Richardean Canal, MD 10/24/14 [phone]

## 2014-10-21 NOTE — ED Notes (Signed)
Unable to locate pt in lobby  

## 2014-10-21 NOTE — ED Notes (Signed)
I have a burning in my stomach.

## 2014-10-22 LAB — GC/CHLAMYDIA PROBE AMP (~~LOC~~) NOT AT ARMC
Chlamydia: NEGATIVE
Neisseria Gonorrhea: NEGATIVE

## 2014-10-22 LAB — HIV ANTIBODY (ROUTINE TESTING W REFLEX): HIV Screen 4th Generation wRfx: NONREACTIVE

## 2014-10-23 LAB — URINE CULTURE

## 2015-09-14 ENCOUNTER — Encounter (HOSPITAL_COMMUNITY): Payer: Self-pay | Admitting: *Deleted

## 2015-09-14 ENCOUNTER — Emergency Department (HOSPITAL_COMMUNITY)
Admission: EM | Admit: 2015-09-14 | Discharge: 2015-09-15 | Disposition: A | Discharge status: Home / Self Care | Payer: Medicaid Other | Attending: Emergency Medicine | Admitting: Emergency Medicine

## 2015-09-14 DIAGNOSIS — K2970 Gastritis, unspecified, without bleeding: Secondary | ICD-10-CM | POA: Diagnosis not present

## 2015-09-14 DIAGNOSIS — R112 Nausea with vomiting, unspecified: Secondary | ICD-10-CM

## 2015-09-14 DIAGNOSIS — A599 Trichomoniasis, unspecified: Secondary | ICD-10-CM

## 2015-09-14 DIAGNOSIS — F17210 Nicotine dependence, cigarettes, uncomplicated: Secondary | ICD-10-CM | POA: Insufficient documentation

## 2015-09-14 DIAGNOSIS — J45909 Unspecified asthma, uncomplicated: Secondary | ICD-10-CM | POA: Diagnosis not present

## 2015-09-14 DIAGNOSIS — R1013 Epigastric pain: Secondary | ICD-10-CM | POA: Diagnosis present

## 2015-09-14 LAB — CBC WITH DIFFERENTIAL/PLATELET
BASOS ABS: 0 10*3/uL (ref 0.0–0.1)
Basophils Relative: 0 %
EOS PCT: 3 %
Eosinophils Absolute: 0.5 10*3/uL (ref 0.0–0.7)
HEMATOCRIT: 45.8 % (ref 36.0–46.0)
Hemoglobin: 15 g/dL (ref 12.0–15.0)
LYMPHS PCT: 20 %
Lymphs Abs: 3.1 10*3/uL (ref 0.7–4.0)
MCH: 29.1 pg (ref 26.0–34.0)
MCHC: 32.8 g/dL (ref 30.0–36.0)
MCV: 88.9 fL (ref 78.0–100.0)
MONO ABS: 0.7 10*3/uL (ref 0.1–1.0)
MONOS PCT: 5 %
NEUTROS ABS: 11.2 10*3/uL — AB (ref 1.7–7.7)
Neutrophils Relative %: 72 %
PLATELETS: 312 10*3/uL (ref 150–400)
RBC: 5.15 MIL/uL — ABNORMAL HIGH (ref 3.87–5.11)
RDW: 13.8 % (ref 11.5–15.5)
WBC: 15.5 10*3/uL — ABNORMAL HIGH (ref 4.0–10.5)

## 2015-09-14 LAB — URINE MICROSCOPIC-ADD ON

## 2015-09-14 LAB — COMPREHENSIVE METABOLIC PANEL
ALBUMIN: 4.5 g/dL (ref 3.5–5.0)
ALK PHOS: 64 U/L (ref 38–126)
ALT: 21 U/L (ref 14–54)
AST: 22 U/L (ref 15–41)
Anion gap: 9 (ref 5–15)
BILIRUBIN TOTAL: 0.6 mg/dL (ref 0.3–1.2)
CALCIUM: 9.5 mg/dL (ref 8.9–10.3)
CO2: 27 mmol/L (ref 22–32)
Chloride: 104 mmol/L (ref 101–111)
Creatinine, Ser: 0.7 mg/dL (ref 0.44–1.00)
GFR calc Af Amer: >60 mL/min (ref >60)
GFR calc non Af Amer: >60 mL/min (ref >60)
GLUCOSE: 115 mg/dL — AB (ref 65–99)
POTASSIUM: 3.4 mmol/L — AB (ref 3.5–5.1)
SODIUM: 140 mmol/L (ref 135–145)
TOTAL PROTEIN: 8.2 g/dL — AB (ref 6.5–8.1)

## 2015-09-14 LAB — URINALYSIS, ROUTINE W REFLEX MICROSCOPIC
Bilirubin Urine: NEGATIVE
GLUCOSE, UA: NEGATIVE mg/dL
Ketones, ur: NEGATIVE mg/dL
Nitrite: NEGATIVE
PH: 7 (ref 5.0–8.0)
PROTEIN: NEGATIVE mg/dL
Specific Gravity, Urine: 1.017 (ref 1.005–1.030)

## 2015-09-14 LAB — LIPASE, BLOOD: Lipase: 35 U/L (ref 11–51)

## 2015-09-14 LAB — I-STAT BETA HCG BLOOD, ED (MC, WL, AP ONLY): I-stat hCG, quantitative: <5 m[IU]/mL (ref <5)

## 2015-09-14 MED ORDER — GI COCKTAIL ~~LOC~~
30.0000 mL | Freq: Once | ORAL | Status: AC
  Administered 2015-09-14: 30 mL via ORAL
  Filled 2015-09-14: qty 30

## 2015-09-14 MED ORDER — ONDANSETRON 4 MG PO TBDP
ORAL_TABLET | ORAL | Status: AC
  Filled 2015-09-14: qty 2

## 2015-09-14 MED ORDER — HYDROMORPHONE HCL 1 MG/ML IJ SOLN
1.0000 mg | Freq: Once | INTRAMUSCULAR | Status: AC
  Administered 2015-09-14: 1 mg via INTRAVENOUS
  Filled 2015-09-14: qty 1

## 2015-09-14 MED ORDER — SODIUM CHLORIDE 0.9 % IV BOLUS (SEPSIS)
1000.0000 mL | Freq: Once | INTRAVENOUS | Status: AC
  Administered 2015-09-14: 1000 mL via INTRAVENOUS

## 2015-09-14 MED ORDER — ONDANSETRON 4 MG PO TBDP
8.0000 mg | ORAL_TABLET | Freq: Once | ORAL | Status: AC
  Administered 2015-09-14: 8 mg via ORAL

## 2015-09-14 MED ORDER — FAMOTIDINE IN NACL 20-0.9 MG/50ML-% IV SOLN
20.0000 mg | Freq: Once | INTRAVENOUS | Status: AC
  Administered 2015-09-14: 20 mg via INTRAVENOUS
  Filled 2015-09-14: qty 50

## 2015-09-14 MED ORDER — PROMETHAZINE HCL 25 MG/ML IJ SOLN
25.0000 mg | Freq: Once | INTRAMUSCULAR | Status: AC
  Administered 2015-09-14: 25 mg via INTRAVENOUS
  Filled 2015-09-14: qty 1

## 2015-09-14 NOTE — ED Triage Notes (Signed)
Patient presents with N/V  History of reflux and ulcers

## 2015-09-15 MED ORDER — PROMETHAZINE HCL 25 MG PO TABS: 25.0000 mg | ORAL_TABLET | Freq: Four times a day (QID) | ORAL | 0 refills | Status: DC | PRN

## 2015-09-15 MED ORDER — METRONIDAZOLE 500 MG PO TABS: 500.0000 mg | ORAL_TABLET | Freq: Two times a day (BID) | ORAL | 0 refills | Status: DC

## 2015-09-15 MED ORDER — HYDROCODONE-ACETAMINOPHEN 5-325 MG PO TABS: 2.0000 | ORAL_TABLET | ORAL | 0 refills | Status: DC | PRN

## 2015-09-15 NOTE — ED Notes (Signed)
Pt provided with d/c instructions at this time.  Pt verbalizes understanding of d/c instructions as well as follow up procedure after d/c.  Pt provided with RX for promethazine, norco, and flagyl.  Pt verbalizes understanding of RX directions. Pt in no apparent distress at this time. Pt assisted out of the ED via wc at pt request.

## 2015-09-15 NOTE — ED Provider Notes (Signed)
[facility]-EMERGENCY DEPT Provider Note   CSN: [id number] Arrival date & time: 09/14/15  2048  First Provider Contact:  None       History   Chief Complaint Chief Complaint  Patient presents with  . Abdominal Pain    HPI Barbara Bruce is a 30 y.o. female who presents with abdominal pain. PMH significant for GERD, gastritis, and PUD. She has been seen multiple times in the past for the same. She states that she ate some fatty food and drank alcohol last night which usually causes her symptoms. The abdominal pain started today, is in the epigastric area, and is constant. She has tried OTC nausea medicine without relief. She reports associated nausea and non-bloody non-bilious vomiting. Denies fever, chills, CP, SOB, diarrhea, blood in the stool, dysuria, vaginal discharge, vaginal bleeding. Denies previous abdominal surgeries.  HPI  Past Medical History:  Diagnosis Date  . Asthma   . Ulcer     There are no active problems to display for this patient.   History reviewed. No pertinent surgical history.  OB History    No data available       Home Medications    Prior to Admission medications   Medication Sig Start Date End Date Taking? Authorizing Provider  albuterol (PROVENTIL HFA;VENTOLIN HFA) 108 (90 BASE) MCG/ACT inhaler Inhale 2 puffs into the lungs every 6 (six) hours as needed for wheezing (wheezing).    Yes Historical Provider, MD  famotidine (PEPCID) 20 MG tablet Take 1 tablet (20 mg total) by mouth 2 (two) times daily. 06/02/14  Yes Lorre Nick, MD  naproxen sodium (ALEVE) 220 MG tablet Take 440 mg by mouth daily as needed (pain).    Yes Historical Provider, MD  omeprazole (PRILOSEC) 20 MG capsule Take 20 mg by mouth daily.   Yes Historical Provider, MD  promethazine (PHENERGAN) 25 MG tablet Take 1 tablet (25 mg total) by mouth every 6 (six) hours as needed for nausea or vomiting. 03/26/13  Yes Teressa Lower, NP    Family History No family history on  file.  Social History Social History  Substance Use Topics  . Smoking status: Current Every Day Smoker    Types: Cigarettes  . Smokeless tobacco: Never Used  . Alcohol use Yes     Allergies   Review of patient's allergies indicates no known allergies.   Review of Systems Review of Systems  Constitutional: Negative for chills and fever.  Respiratory: Negative for shortness of breath.   Cardiovascular: Negative for chest pain.  Gastrointestinal: Positive for abdominal pain, nausea and vomiting. Negative for blood in stool and diarrhea.  Genitourinary: Negative for dysuria, vaginal bleeding and vaginal discharge.  All other systems reviewed and are negative.    Physical Exam Updated Vital Signs BP 124/78   Pulse 76   Temp 98.2 F (36.8 C) (Oral)   Resp 20   Wt 68 kg   LMP 08/12/2015   SpO2 98%   BMI 32.46 kg/m   Physical Exam  Constitutional: She is oriented to person, place, and time. She appears well-developed and well-nourished. No distress.  HENT:  Head: Normocephalic and atraumatic.  Eyes: Conjunctivae are normal. Pupils are equal, round, and reactive to light. Right eye exhibits no discharge. Left eye exhibits no discharge. No scleral icterus.  Neck: Normal range of motion. Neck supple.  Cardiovascular: Normal rate, regular rhythm, normal heart sounds and intact distal pulses.  Exam reveals no gallop and no friction rub.   No murmur heard. Pulmonary/Chest:  Effort normal and breath sounds normal. No respiratory distress. She has no wheezes. She has no rales. She exhibits no tenderness.  Abdominal: Soft. She exhibits no distension and no mass. Bowel sounds are increased. There is tenderness. There is no rebound and no guarding. No hernia.  Moderate-severe epigastric tenderness; mild RUQ tenderness  Musculoskeletal: She exhibits no edema.  Neurological: She is alert and oriented to person, place, and time.  Skin: Skin is warm and dry.  Psychiatric: She has a  normal mood and affect. Her behavior is normal.  Nursing note and vitals reviewed.    ED Treatments / Results  Labs (all labs ordered are listed, but only abnormal results are displayed) Labs Reviewed  COMPREHENSIVE METABOLIC PANEL - Abnormal; Notable for the following:       Result Value   Potassium 3.4 (*)    Glucose, Bld 115 (*)    BUN <5 (*)    Total Protein 8.2 (*)    All other components within normal limits  URINALYSIS, ROUTINE W REFLEX MICROSCOPIC (NOT AT [facility]) - Abnormal; Notable for the following:    APPearance CLOUDY (*)    Hgb urine dipstick LARGE (*)    Leukocytes, UA MODERATE (*)    All other components within normal limits  CBC WITH DIFFERENTIAL/PLATELET - Abnormal; Notable for the following:    WBC 15.5 (*)    RBC 5.15 (*)    Neutro Abs 11.2 (*)    All other components within normal limits  URINE MICROSCOPIC-ADD ON - Abnormal; Notable for the following:    Squamous Epithelial / LPF 0-5 (*)    Bacteria, UA RARE (*)    All other components within normal limits  LIPASE, BLOOD  I-STAT BETA HCG BLOOD, ED ([facility], [facility], [facility] ONLY)    EKG  EKG Interpretation None       Radiology No results found.  Procedures Procedures (including critical care time)  Medications Ordered in ED Medications  ondansetron (ZOFRAN-ODT) 4 MG disintegrating tablet (not administered)  ondansetron (ZOFRAN-ODT) disintegrating tablet 8 mg (8 mg Oral Given 09/14/15 2139)  sodium chloride 0.9 % bolus 1,000 mL (1,000 mLs Intravenous New Bag/Given 09/14/15 2327)  promethazine (PHENERGAN) injection 25 mg (25 mg Intravenous Given 09/14/15 2332)  famotidine (PEPCID) IVPB 20 mg premix (20 mg Intravenous New Bag/Given 09/14/15 2327)  gi cocktail (Maalox,Lidocaine,Donnatal) (30 mLs Oral Given 09/14/15 2326)  HYDROmorphone (DILAUDID) injection 1 mg (1 mg Intravenous Given 09/14/15 2342)     Initial Impression / Assessment and Plan / ED Course  I have reviewed the triage vital signs and the nursing  notes.  Pertinent labs & imaging results that were available during my care of the patient were reviewed by me and considered in my medical decision making (see chart for details).  Clinical Course   30 year old female presents with abdominal pain, N/V. She has been here multiple times in the past for the same. Will treat similarly with IVF, Phenergan, Dilaudid, Pepcid. Patient did not tolerate GI cocktail. CBC remarkable for leukocytosis of 15.5. CMP remarkable for mild hypokalemia and elevated total protein. Pregnancy test is neg. UA remarkable for large hgb, moderate leukocytses, and trichomonas present.   Patient tolerate PO challenge and reports feeling much better. Will d/c with antiemetics and pain medicine as well as rx for Flagyl. Advised no alcohol use with this medicine. Patient is NAD, non-toxic, with stable VS. Patient is informed of clinical course, understands medical decision making process, and agrees with plan. Opportunity for  questions provided and all questions answered. Return precautions given.   Final Clinical Impressions(s) / ED Diagnoses   Final diagnoses:  Gastritis  Non-intractable vomiting with nausea, vomiting of unspecified type  Trichimoniasis    New Prescriptions Discharge Medication List as of 09/15/2015 12:55 AM    START taking these medications   Details  HYDROcodone-acetaminophen (NORCO/VICODIN) 5-325 MG tablet Take 2 tablets by mouth every 4 (four) hours as needed., Starting Sun 09/15/2015, Print    metroNIDAZOLE (FLAGYL) 500 MG tablet Take 1 tablet (500 mg total) by mouth 2 (two) times daily., Starting Sun 09/15/2015, Print         Bethel Born, PA-C 09/15/15 1525    Tomasita Crumble, MD 09/15/15 [phone]

## 2016-04-22 ENCOUNTER — Encounter (HOSPITAL_COMMUNITY): Payer: Self-pay | Admitting: Emergency Medicine

## 2016-04-22 ENCOUNTER — Emergency Department (HOSPITAL_COMMUNITY)
Admission: EM | Admit: 2016-04-22 | Discharge: 2016-04-23 | Discharge status: Against Medical Advice | Payer: Medicaid Other | Attending: Emergency Medicine | Admitting: Emergency Medicine

## 2016-04-22 DIAGNOSIS — Z5321 Procedure and treatment not carried out due to patient leaving prior to being seen by health care provider: Secondary | ICD-10-CM | POA: Diagnosis not present

## 2016-04-22 DIAGNOSIS — R109 Unspecified abdominal pain: Secondary | ICD-10-CM | POA: Diagnosis present

## 2016-04-22 HISTORY — DX: Gastro-esophageal reflux disease without esophagitis: K21.9

## 2016-04-22 LAB — COMPREHENSIVE METABOLIC PANEL
ALK PHOS: 56 U/L (ref 38–126)
ALT: 22 U/L (ref 14–54)
ANION GAP: 12 (ref 5–15)
AST: 27 U/L (ref 15–41)
Albumin: 4.5 g/dL (ref 3.5–5.0)
BILIRUBIN TOTAL: 0.3 mg/dL (ref 0.3–1.2)
BUN: <5 mg/dL — ABNORMAL LOW (ref 6–20)
CALCIUM: 9.2 mg/dL (ref 8.9–10.3)
CO2: 24 mmol/L (ref 22–32)
Chloride: 100 mmol/L — ABNORMAL LOW (ref 101–111)
Creatinine, Ser: 0.72 mg/dL (ref 0.44–1.00)
Glucose, Bld: 125 mg/dL — ABNORMAL HIGH (ref 65–99)
Potassium: 3.4 mmol/L — ABNORMAL LOW (ref 3.5–5.1)
SODIUM: 136 mmol/L (ref 135–145)
TOTAL PROTEIN: 8.2 g/dL — AB (ref 6.5–8.1)

## 2016-04-22 LAB — URINALYSIS, ROUTINE W REFLEX MICROSCOPIC
Bilirubin Urine: NEGATIVE
GLUCOSE, UA: NEGATIVE mg/dL
Ketones, ur: NEGATIVE mg/dL
NITRITE: NEGATIVE
PH: 6.5 (ref 5.0–8.0)
Protein, ur: NEGATIVE mg/dL
SPECIFIC GRAVITY, URINE: 1.02 (ref 1.005–1.030)

## 2016-04-22 LAB — URINALYSIS, MICROSCOPIC (REFLEX): BACTERIA UA: NONE SEEN

## 2016-04-22 LAB — CBC
HCT: 32.1 % — ABNORMAL LOW (ref 36.0–46.0)
HEMOGLOBIN: 9.8 g/dL — AB (ref 12.0–15.0)
MCH: 21.6 pg — ABNORMAL LOW (ref 26.0–34.0)
MCHC: 30.5 g/dL (ref 30.0–36.0)
MCV: 70.9 fL — ABNORMAL LOW (ref 78.0–100.0)
PLATELETS: 559 10*3/uL — AB (ref 150–400)
RBC: 4.53 MIL/uL (ref 3.87–5.11)
RDW: 15.8 % — ABNORMAL HIGH (ref 11.5–15.5)
WBC: 21.4 10*3/uL — AB (ref 4.0–10.5)

## 2016-04-22 LAB — LIPASE, BLOOD: Lipase: 23 U/L (ref 11–51)

## 2016-04-22 LAB — POC URINE PREG, ED: Preg Test, Ur: NEGATIVE

## 2016-04-22 MED ORDER — FAMOTIDINE IN NACL 20-0.9 MG/50ML-% IV SOLN
20.0000 mg | Freq: Once | INTRAVENOUS | Status: AC
  Administered 2016-04-22: 20 mg via INTRAVENOUS
  Filled 2016-04-22: qty 50

## 2016-04-22 MED ORDER — ONDANSETRON HCL 4 MG/2ML IJ SOLN
4.0000 mg | Freq: Once | INTRAMUSCULAR | Status: AC | PRN
Start: 2016-04-22 — End: 2016-04-22
  Administered 2016-04-22: 4 mg via INTRAVENOUS

## 2016-04-22 MED ORDER — METOCLOPRAMIDE HCL 5 MG/ML IJ SOLN
10.0000 mg | Freq: Once | INTRAMUSCULAR | Status: AC
  Administered 2016-04-23: 10 mg via INTRAVENOUS
  Filled 2016-04-22: qty 2

## 2016-04-22 MED ORDER — SODIUM CHLORIDE 0.9 % IV BOLUS (SEPSIS)
1000.0000 mL | Freq: Once | INTRAVENOUS | Status: AC
  Administered 2016-04-22: 1000 mL via INTRAVENOUS

## 2016-04-22 MED ORDER — ONDANSETRON HCL 4 MG/2ML IJ SOLN
INTRAMUSCULAR | Status: AC
  Filled 2016-04-22: qty 2

## 2016-04-22 NOTE — ED Notes (Signed)
Dr. Eudelia Bunchardama at bedside doing abd US. Pt tearful at this time

## 2016-04-22 NOTE — ED Triage Notes (Signed)
C/o burning pain to mid abd since last night.  Reports nausea and vomiting all day today.  Actively vomiting on arrival.  Reports diarrhea 2 days ago.

## 2016-04-23 NOTE — ED Notes (Addendum)
Went into pt room to reassess pain and pt was not in room. Iv on bed. Looked for pt throughout ED with no success

## 2016-07-05 ENCOUNTER — Encounter (HOSPITAL_COMMUNITY): Payer: Self-pay

## 2016-07-05 ENCOUNTER — Emergency Department (HOSPITAL_COMMUNITY)
Admission: EM | Admit: 2016-07-05 | Discharge: 2016-07-05 | Disposition: A | Discharge status: Home / Self Care | Payer: Medicaid Other | Attending: Emergency Medicine | Admitting: Emergency Medicine

## 2016-07-05 DIAGNOSIS — Z7951 Long term (current) use of inhaled steroids: Secondary | ICD-10-CM | POA: Insufficient documentation

## 2016-07-05 DIAGNOSIS — J45909 Unspecified asthma, uncomplicated: Secondary | ICD-10-CM | POA: Diagnosis not present

## 2016-07-05 DIAGNOSIS — F17210 Nicotine dependence, cigarettes, uncomplicated: Secondary | ICD-10-CM | POA: Insufficient documentation

## 2016-07-05 DIAGNOSIS — H9203 Otalgia, bilateral: Secondary | ICD-10-CM

## 2016-07-05 MED ORDER — PROCHLORPERAZINE EDISYLATE 5 MG/ML IJ SOLN
10.0000 mg | Freq: Once | INTRAMUSCULAR | Status: AC
  Administered 2016-07-05: 10 mg via INTRAVENOUS

## 2016-07-05 MED ORDER — DIPHENHYDRAMINE HCL 50 MG/ML IJ SOLN
12.5000 mg | Freq: Once | INTRAMUSCULAR | Status: AC
  Administered 2016-07-05: 12.5 mg via INTRAVENOUS

## 2016-07-05 MED ORDER — DEXAMETHASONE SODIUM PHOSPHATE 10 MG/ML IJ SOLN
10.0000 mg | Freq: Once | INTRAMUSCULAR | Status: AC
  Administered 2016-07-05: 10 mg via INTRAVENOUS
  Filled 2016-07-05: qty 1

## 2016-07-05 MED ORDER — KETOROLAC TROMETHAMINE 60 MG/2ML IM SOLN
60.0000 mg | Freq: Once | INTRAMUSCULAR | Status: AC
  Administered 2016-07-05: 60 mg via INTRAMUSCULAR
  Filled 2016-07-05: qty 2

## 2016-07-05 NOTE — ED Provider Notes (Signed)
[facility]-EMERGENCY DEPT Provider Note   CSN: [id number] Arrival date & time: 07/05/16  1946 By signing my name below, I, Elsie Stain, attest that this documentation has been prepared under the direction and in the presence of non-physician provider, Candie Mile, [place]. Electronically Signed: Elsie Stain, ED Scribe. 07/05/2016. 8:03 PM.   History   Chief Complaint Chief Complaint  Patient presents with  . Otalgia   HPI Barbara Bruce is a 31 y.o. female  who presents to the Emergency Department complaining of persistent, gradually worsening bilateral otalgia onset six months ago. She describes this as 10/10, pressure-like pain which began in both ears simultaneously. Pain radiates from ear into the throat and is exacerbated by palpation. She reports associated headaches, fever, chills, nasal congestion, hearing loss, tinnitus, ear drainage. She was seen for the same at [facility] two days ago where she was dx with an ear infection and rx amoxicillin which she has taken with no reported relief. She has never experienced this before. Pt denies changes in gait, blurred or double vision, photophobia, or any other associated symptoms.   The history is provided by the patient. No language interpreter was used.    Past Medical History:  Diagnosis Date  . Asthma   . GERD (gastroesophageal reflux disease)   . Ulcer     There are no active problems to display for this patient.   History reviewed. No pertinent surgical history.  OB History    No data available       Home Medications    Prior to Admission medications   Medication Sig Start Date End Date Taking? Authorizing Provider  albuterol (PROVENTIL HFA;VENTOLIN HFA) 108 (90 BASE) MCG/ACT inhaler Inhale 2 puffs into the lungs every 6 (six) hours as needed for wheezing (wheezing).     [provider]  famotidine (PEPCID) 20 MG tablet Take 1 tablet (20 mg total) by mouth 2 (two) times daily. 06/02/14   Lorre Nick, MD  HYDROcodone-acetaminophen (NORCO/VICODIN) 5-325 MG tablet Take 2 tablets by mouth every 4 (four) hours as needed. 09/15/15   Bethel Born, PA-C  metroNIDAZOLE (FLAGYL) 500 MG tablet Take 1 tablet (500 mg total) by mouth 2 (two) times daily. 09/15/15   Bethel Born, PA-C  naproxen sodium (ALEVE) 220 MG tablet Take 440 mg by mouth daily as needed (pain).     [provider]  omeprazole (PRILOSEC) 20 MG capsule Take 20 mg by mouth daily.    [provider]  promethazine (PHENERGAN) 25 MG tablet Take 1 tablet (25 mg total) by mouth every 6 (six) hours as needed for nausea or vomiting. 09/15/15   Bethel Born, PA-C    Family History History reviewed. No pertinent family history.  Social History Social History  Substance Use Topics  . Smoking status: Current Every Day Smoker    Types: Cigarettes  . Smokeless tobacco: Never Used  . Alcohol use Yes     Allergies   Patient has no known allergies.   Review of Systems Review of Systems  Constitutional: Positive for chills and fever.  HENT: Positive for ear discharge, ear pain and hearing loss.      Physical Exam Updated Vital Signs BP (!) 157/104 (BP Location: Right Arm)   Pulse 98   Temp 98.6 F (37 C) (Oral)   Resp 20   Ht 4\' 9"  (1.448 m)   Wt 77.1 kg (170 lb)   LMP 04/07/2016   SpO2 98%   BMI  36.79 kg/m   Physical Exam  Constitutional: She is oriented to person, place, and time. She appears well-developed and well-nourished.  Uncomfortable. Tearful on exam.   HENT:  Head: Normocephalic and atraumatic.  Right Ear: External ear normal.  Left Ear: External ear normal.  Nose: Nose normal.  Mouth/Throat: Oropharynx is clear and moist. No oropharyngeal exudate.  Oropharynx without evidence of redness or exudates. Tonsils without evidence of redness, swelling, or exudates. TM's appear normal with no evidence of bulging. No hemotympanum. EAC appear non erythematous and not swollen.  Mild wax noted.  No redness or swelling to mastoid process. She is tender to manipulation of ear, mastoid process, TMJ, and neck.   Eyes: EOM are normal. Pupils are equal, round, and reactive to light.  Neck: Normal range of motion. No JVD present.  Normal ROM. No nuchal rigidity.  No swelling noted.   Cardiovascular: Normal rate, normal heart sounds and intact distal pulses.   Pulmonary/Chest: Effort normal and breath sounds normal. No stridor. No respiratory distress. She has no wheezes. She has no rales.  Lungs CTA. No wheezing. No rales. No stridor. Normal work of breathing  Abdominal: Soft. There is no tenderness. There is no rebound and no guarding.  Soft and nontender. No rebound. No guarding. Negative murphy's sign. No focal tenderness at McBurney's point. No CVA tenderness. No evidence of hernia  Neurological: She is alert and oriented to person, place, and time.  Skin: Skin is warm. Capillary refill takes less than 2 seconds.  Psychiatric: She has a normal mood and affect. Her behavior is normal.  Nursing note and vitals reviewed.   ED Treatments / Results  DIAGNOSTIC STUDIES:  Oxygen Saturation is 98% on RA, normal by my interpretation.    COORDINATION OF CARE:  9:29 PM Discussed treatment plan with pt at bedside and pt agreed to plan.  Labs (all labs ordered are listed, but only abnormal results are displayed) Labs Reviewed - No data to display  EKG  EKG Interpretation None       Radiology No results found.  Procedures Procedures (including critical care time)  Medications Ordered in ED Medications  prochlorperazine (COMPAZINE) injection 10 mg (10 mg Intravenous Given 07/05/16 2244)  diphenhydrAMINE (BENADRYL) injection 12.5 mg (12.5 mg Intravenous Given 07/05/16 2243)  ketorolac (TORADOL) injection 60 mg (60 mg Intramuscular Given 07/05/16 2244)  dexamethasone (DECADRON) injection 10 mg (10 mg Intravenous Given 07/05/16 2242)     Initial Impression /  Assessment and Plan / ED Course  I have reviewed the triage vital signs and the nursing notes.  Pertinent labs & imaging results that were available during my care of the patient were reviewed by me and considered in my medical decision making (see chart for details).      Patient here with otalgia for 6 months. Shows a complaint of associated headache, with no changes in visual symptoms, changes in gait, similar previous headaches.  She was seen 2 days ago for similar and was given amoxicillin and migraine cocktail in ED. She appears uncomfortable and tearful on exam. Patient's ears and TMs appeared benign on exam. Patient had pain to ears, mastoid process, TMJ, and neck. She at first asked for migraine cocktail since it worked for her 2 days ago and now would like it again. She then progressed to demanding the same thing she got 2 days ago after I told her I would give her just compazine and benadryl.  Pt given medication here in ED and left  before I was able to reassessed patient and give discharge instructions. She was found to not be in the room, with no evidence of IV pulled out.  Dr. Eudelia Bunch also saw and evaluated pt and agreed that there was no evidence of infection.  I was was able to instruct patient to get a primary care provider and for her to see an ENT regarding her symptoms as soon as possible with the reference I was going to give her, since this has been going on for 6 months and no evidence of infection here. I also told her to continue amoxicillin. All this was instructed before migraine cocktail was administered.   Pt eloped and not able to reasess, and give discharge papers.    Final Clinical Impressions(s) / ED Diagnoses   Final diagnoses:  Otalgia of both ears    New Prescriptions Discharge Medication List as of 07/05/2016 10:51 PM      I personally performed the services described in this documentation, which was scribed in my presence. The recorded information has  been reviewed and is accurate.    Candie Mile [place], [place] 07/05/16 2357    Nira Conn, MD 07/06/16 [phone]

## 2016-07-05 NOTE — ED Notes (Signed)
PT NOT FOUND IN THE ROOM TO REMOVE THE IV ACCESS AND GIVE DISCHARGE INSTRUCTIONS. PA FRANK AND CHARGE NURSE MADE AWARE.

## 2016-07-05 NOTE — ED Triage Notes (Signed)
PT C/O BILATERAL EAR PAIN WITH DRAINAGE X6 MONTHS. PT STS SHE WAS SEEN 2 DAYS AGO,A ND GIVEN AMOXICILLIN W/O RELIEF.

## 2016-07-05 NOTE — Discharge Instructions (Signed)
Please continue amoxicillin given to you by previous provider 2 days ago. Please follow up with a primary care provider regarding today's visit. Please use financial resource guide attached if needed. You may need a consultation with ear nose and throat specialist.  Get help right away if: You have a severe headache. You have a stiff neck. You have trouble swallowing. You have redness or swelling behind your ear. You have fluid or blood coming from your ear. You have hearing loss. You feel dizzy.

## 2016-07-05 NOTE — ED Notes (Signed)
CALL PLACED TO THE PT TO HAVE HER RETURN TO GET THE IV ACCESS REMOVED. PT DID NOT ANSWER, SO A MESSAGE WAS LEFT.

## 2016-07-05 NOTE — ED Notes (Signed)
PT VERY UPSET ABOUT THE MEDICATION THAT WAS ORDERED. PA FRANK MADE AWARE.

## 2017-04-22 ENCOUNTER — Emergency Department (HOSPITAL_COMMUNITY): Payer: Medicaid Other

## 2017-04-22 ENCOUNTER — Other Ambulatory Visit: Payer: Self-pay

## 2017-04-22 ENCOUNTER — Emergency Department (HOSPITAL_COMMUNITY)
Admission: EM | Admit: 2017-04-22 | Discharge: 2017-04-22 | Disposition: A | Discharge status: Home / Self Care | Payer: Medicaid Other | Attending: Emergency Medicine | Admitting: Emergency Medicine

## 2017-04-22 ENCOUNTER — Encounter (HOSPITAL_COMMUNITY): Payer: Self-pay

## 2017-04-22 DIAGNOSIS — R05 Cough: Secondary | ICD-10-CM | POA: Diagnosis present

## 2017-04-22 DIAGNOSIS — J069 Acute upper respiratory infection, unspecified: Secondary | ICD-10-CM | POA: Insufficient documentation

## 2017-04-22 LAB — BASIC METABOLIC PANEL
Anion gap: 9 (ref 5–15)
BUN: 6 mg/dL (ref 6–20)
CO2: 25 mmol/L (ref 22–32)
Calcium: 8.4 mg/dL — ABNORMAL LOW (ref 8.9–10.3)
Chloride: 102 mmol/L (ref 101–111)
Creatinine, Ser: 0.64 mg/dL (ref 0.44–1.00)
GFR calc Af Amer: >60 mL/min (ref >60)
GFR calc non Af Amer: >60 mL/min (ref >60)
Glucose, Bld: 154 mg/dL — ABNORMAL HIGH (ref 65–99)
Potassium: 3.4 mmol/L — ABNORMAL LOW (ref 3.5–5.1)
Sodium: 136 mmol/L (ref 135–145)

## 2017-04-22 LAB — CBC WITH DIFFERENTIAL/PLATELET
Basophils Absolute: 0 10*3/uL (ref 0.0–0.1)
Basophils Relative: 0 %
Eosinophils Absolute: 0.1 10*3/uL (ref 0.0–0.7)
Eosinophils Relative: 1 %
HCT: 34.2 % — ABNORMAL LOW (ref 36.0–46.0)
Hemoglobin: 11 g/dL — ABNORMAL LOW (ref 12.0–15.0)
Lymphocytes Relative: 16 %
Lymphs Abs: 1.4 10*3/uL (ref 0.7–4.0)
MCH: 27.4 pg (ref 26.0–34.0)
MCHC: 32.2 g/dL (ref 30.0–36.0)
MCV: 85.1 fL (ref 78.0–100.0)
Monocytes Absolute: 0.3 10*3/uL (ref 0.1–1.0)
Monocytes Relative: 3 %
Neutro Abs: 6.9 10*3/uL (ref 1.7–7.7)
Neutrophils Relative %: 80 %
Platelets: 335 10*3/uL (ref 150–400)
RBC: 4.02 MIL/uL (ref 3.87–5.11)
RDW: 15.9 % — ABNORMAL HIGH (ref 11.5–15.5)
WBC: 8.6 10*3/uL (ref 4.0–10.5)

## 2017-04-22 LAB — I-STAT CG4 LACTIC ACID, ED: Lactic Acid, Venous: 1.62 mmol/L (ref 0.5–1.9)

## 2017-04-22 LAB — INFLUENZA PANEL BY PCR (TYPE A & B)
Influenza A By PCR: POSITIVE — AB
Influenza B By PCR: NEGATIVE

## 2017-04-22 MED ORDER — SODIUM CHLORIDE 0.9 % IV BOLUS (SEPSIS)
1000.0000 mL | Freq: Once | INTRAVENOUS | Status: AC
  Administered 2017-04-22: 1000 mL via INTRAVENOUS

## 2017-04-22 MED ORDER — ALBUTEROL (5 MG/ML) CONTINUOUS INHALATION SOLN
10.0000 mg/h | INHALATION_SOLUTION | RESPIRATORY_TRACT | Status: DC
  Administered 2017-04-22: 10 mg/h via RESPIRATORY_TRACT

## 2017-04-22 MED ORDER — OSELTAMIVIR PHOSPHATE 75 MG PO CAPS: 75.0000 mg | ORAL_CAPSULE | Freq: Two times a day (BID) | ORAL | 0 refills | Status: DC

## 2017-04-22 MED ORDER — ALBUTEROL SULFATE HFA 108 (90 BASE) MCG/ACT IN AERS
1.0000 | INHALATION_SPRAY | RESPIRATORY_TRACT | Status: DC | PRN
  Administered 2017-04-22: 2 via RESPIRATORY_TRACT
  Filled 2017-04-22: qty 6.7

## 2017-04-22 MED ORDER — IPRATROPIUM BROMIDE 0.02 % IN SOLN
0.5000 mg | Freq: Once | RESPIRATORY_TRACT | Status: AC
  Administered 2017-04-22: 0.5 mg via RESPIRATORY_TRACT
  Filled 2017-04-22: qty 2.5

## 2017-04-22 MED ORDER — DEXAMETHASONE SODIUM PHOSPHATE 10 MG/ML IJ SOLN
10.0000 mg | Freq: Once | INTRAMUSCULAR | Status: AC
  Administered 2017-04-22: 10 mg via INTRAVENOUS
  Filled 2017-04-22: qty 1

## 2017-04-22 MED ORDER — AZITHROMYCIN 250 MG PO TABS
500.0000 mg | ORAL_TABLET | Freq: Once | ORAL | Status: AC
Start: 2017-04-22 — End: 2017-04-22
  Administered 2017-04-22: 500 mg via ORAL
  Filled 2017-04-22: qty 2

## 2017-04-22 MED ORDER — IBUPROFEN 800 MG PO TABS
800.0000 mg | ORAL_TABLET | Freq: Once | ORAL | Status: AC
  Administered 2017-04-22: 800 mg via ORAL
  Filled 2017-04-22: qty 1

## 2017-04-22 MED ORDER — MAGNESIUM SULFATE 2 GM/50ML IV SOLN
2.0000 g | Freq: Once | INTRAVENOUS | Status: AC
  Administered 2017-04-22: 2 g via INTRAVENOUS
  Filled 2017-04-22: qty 50

## 2017-04-22 MED ORDER — AZITHROMYCIN 250 MG PO TABS: 250.0000 mg | ORAL_TABLET | Freq: Every day | ORAL | 0 refills | Status: DC

## 2017-04-22 MED ORDER — ACETAMINOPHEN 325 MG PO TABS
650.0000 mg | ORAL_TABLET | Freq: Once | ORAL | Status: AC
  Administered 2017-04-22: 650 mg via ORAL
  Filled 2017-04-22: qty 2

## 2017-04-22 MED ORDER — ALBUTEROL (5 MG/ML) CONTINUOUS INHALATION SOLN
10.0000 mg/h | INHALATION_SOLUTION | Freq: Once | RESPIRATORY_TRACT | Status: DC
  Filled 2017-04-22: qty 20

## 2017-04-22 MED ORDER — OSELTAMIVIR PHOSPHATE 75 MG PO CAPS
75.0000 mg | ORAL_CAPSULE | Freq: Once | ORAL | Status: AC
  Administered 2017-04-22: 75 mg via ORAL
  Filled 2017-04-22: qty 1

## 2017-04-22 MED ORDER — ALBUTEROL SULFATE (2.5 MG/3ML) 0.083% IN NEBU
5.0000 mg | INHALATION_SOLUTION | Freq: Once | RESPIRATORY_TRACT | Status: AC
  Administered 2017-04-22: 5 mg via RESPIRATORY_TRACT
  Filled 2017-04-22: qty 6

## 2017-04-22 NOTE — ED Notes (Signed)
Neb treatment completed.

## 2017-04-22 NOTE — ED Notes (Signed)
Pt departed in NAD.  

## 2017-04-22 NOTE — ED Notes (Addendum)
While Pt was wheeled to room, she was audibly wheezing. Did not necessarily seem out of breath.

## 2017-04-22 NOTE — ED Provider Notes (Signed)
[facility] Provider Note   CSN: [id number] Arrival date & time: 04/22/17  1214     History   Chief Complaint No chief complaint on file.   HPI Ashely Darla LeschesM Bruce is a 32 y.o. female.  Who presents the emergency department chief complaint of flulike symptoms.  She had onset of symptoms about 5 days ago.  She has associated cough, body aches, chills, headache.  She has had some mild nausea and diarrhea.  Patient is a daily smoker.  She complains of wheezing, chest tightness.  She denies history of hospitalization or intubation.  HPI  Past Medical History:  Diagnosis Date  . Asthma   . GERD (gastroesophageal reflux disease)   . Ulcer     There are no active problems to display for this patient.   History reviewed. No pertinent surgical history.  OB History    No data available       Home Medications    Prior to Admission medications   Medication Sig Start Date End Date Taking? Authorizing Provider  albuterol (PROVENTIL HFA;VENTOLIN HFA) 108 (90 BASE) MCG/ACT inhaler Inhale 2 puffs into the lungs every 6 (six) hours as needed for wheezing (wheezing).     [provider]  famotidine (PEPCID) 20 MG tablet Take 1 tablet (20 mg total) by mouth 2 (two) times daily. 06/02/14   Lorre NickAllen, Anthony, MD  HYDROcodone-acetaminophen (NORCO/VICODIN) 5-325 MG tablet Take 2 tablets by mouth every 4 (four) hours as needed. 09/15/15   Bethel BornGekas, Kelly Marie, PA-C  metroNIDAZOLE (FLAGYL) 500 MG tablet Take 1 tablet (500 mg total) by mouth 2 (two) times daily. 09/15/15   Bethel BornGekas, Kelly Marie, PA-C  naproxen sodium (ALEVE) 220 MG tablet Take 440 mg by mouth daily as needed (pain).     [provider]  omeprazole (PRILOSEC) 20 MG capsule Take 20 mg by mouth daily.    [provider]  promethazine (PHENERGAN) 25 MG tablet Take 1 tablet (25 mg total) by mouth every 6 (six) hours as needed for nausea or vomiting. 09/15/15   Bethel BornGekas, Kelly Marie, PA-C     Family History No family history on file.  Social History Social History   Tobacco Use  . Smoking status: Current Every Day Smoker    Types: Cigarettes  . Smokeless tobacco: Never Used  Substance Use Topics  . Alcohol use: Yes  . Drug use: No     Allergies   Patient has no known allergies.   Review of Systems Review of Systems Ten systems reviewed and are negative for acute change, except as noted in the HPI.    Physical Exam Updated Vital Signs BP (!) 122/59 (BP Location: Right Arm)   Pulse (!) 118   Temp 99.1 F (37.3 C) (Oral)   Resp 18   LMP 03/30/2017   SpO2 96%   Physical Exam  Constitutional: She is oriented to person, place, and time. She appears well-developed and well-nourished. No distress.  HENT:  Head: Normocephalic and atraumatic.  Eyes: Conjunctivae are normal. No scleral icterus.  Neck: Normal range of motion.  Cardiovascular: Normal rate, regular rhythm and normal heart sounds. Exam reveals no gallop and no friction rub.  No murmur heard. Pulmonary/Chest: No respiratory distress. She has wheezes.  Prolonged expiratory phase and wheezing  Abdominal: Soft. Bowel sounds are normal. She exhibits no distension and no mass. There is no tenderness. There is no guarding.  Neurological: She is alert and oriented to person, place, and time.  Skin: Skin is warm and dry. She is not diaphoretic.  Psychiatric: Her behavior is normal.  Nursing note and vitals reviewed.    ED Treatments / Results  Labs (all labs ordered are listed, but only abnormal results are displayed) Labs Reviewed - No data to display  EKG  EKG Interpretation None       Radiology Dg Chest 2 View  Result Date: 04/22/2017 CLINICAL DATA:  4 day history of cough and congestion. EXAM: CHEST - 2 VIEW COMPARISON:  None. FINDINGS: The heart size and mediastinal contours are within normal limits. Both lungs are clear. The visualized skeletal structures are unremarkable.  IMPRESSION: No active cardiopulmonary disease. Electronically Signed   By: Kennith Center M.D.   On: 04/22/2017 12:58    Procedures Procedures (including critical care time)  Medications Ordered in ED Medications  sodium chloride 0.9 % bolus 1,000 mL (not administered)  albuterol (PROVENTIL) (2.5 MG/3ML) 0.083% nebulizer solution 5 mg (not administered)  ipratropium (ATROVENT) nebulizer solution 0.5 mg (not administered)  dexamethasone (DECADRON) injection 10 mg (not administered)  magnesium sulfate IVPB 2 g 50 mL (not administered)  ibuprofen (ADVIL,MOTRIN) tablet 800 mg (800 mg Oral Given 04/22/17 1556)     Initial Impression / Assessment and Plan / ED Course  I have reviewed the triage vital signs and the nursing notes.  Pertinent labs & imaging results that were available during my care of the patient were reviewed by me and considered in my medical decision making (see chart for details).     Patient with diffuse wheezing, mild fever, tachycardia.  She has been giving fluids, antipyretics, 5 mg of albuterol and 0.5 mg of Atrovent, magnesium and Decadron.  She will require reevaluation.  I have given sign out to NP Damian Leavell will assume care of the patient.  Final Clinical Impressions(s) / ED Diagnoses   Final diagnoses:  None    ED Discharge Orders    None       Arthor Captain, PA-C 04/22/17 1737    Raeford Razor, MD 04/23/17 [phone]

## 2017-04-22 NOTE — Discharge Instructions (Signed)
Please read attached information. If you experience any new or worsening signs or symptoms please return to the emergency room for evaluation. Please follow-up with your primary care provider or specialist as discussed. Please use medication prescribed only as directed and discontinue taking if you have any concerning signs or symptoms.   °

## 2017-04-22 NOTE — ED Provider Notes (Signed)
32 year old female presents today with upper respiratory infection.  Patient was seen by previous provider please see previous providers note for H&P.  Patient notes approximately 5 days ago she developed rhinorrhea nasal congestion cough and shortness of breath.  She notes fever at that time that has continued to persist.  Patient reports she is a smoker with a history of asthma.  Patient notes that she does not have any albuterol at home and has not been using any.  Prior to my evaluation patient received a dose of Decadron, ibuprofen, Atrovent 0.5 mg, albuterol 2.5 mg and magnesium 2 g.  Patient was also started on a liter of fluid.  Upon my initial presentation patient with both bilateral inspiratory and expiratory wheeze with diffuse with oxygen saturations in the mid to lower 90s.  Patient is tachycardic no longer febrile.  Patient received hour-long breathing treatment here with 10 mg of albuterol.  Her wheeze completely resolved with this.  She was monitored for another hour in the emergency room, I personally ambulated her on the pulse oximetry with no drop in her oxygen ranging 9596 with no respiratory distress speaking full sentences.  She has a normal white count, no longer having a fever with negative chest x-ray.  Given her 4 days of fever and flulike symptoms she was started on Tamiflu here.  Patient also started on azithromycin, she will follow along on my chart.  If she is flu positive she will continue using Tamiflu and azithromycin, if flu is negative she will continue with the azithromycin.  Her husband was present throughout evaluation.  The will return immediately if she develops any new or worsening signs or symptoms.  Patient verbalized understanding and agreement to today's plan had no further questions or concerns at time of discharge.  Vitals:   04/22/17 2100 04/22/17 2115  BP: 117/66   Pulse: (!) 108 (!) 112  Resp: (!) 25 (!) 24  Temp:    SpO2: 97% 94%      Eyvonne MechanicHedges,  Sansa Alkema, PA-C 04/22/17 2216    Vanetta MuldersZackowski, Scott, MD 04/23/17 1820

## 2017-04-22 NOTE — ED Triage Notes (Signed)
Patient complains of 4 days of cough, congestion, body aches. States that she has not used inhaler. Smoker. Alert and oriented, NAD

## 2017-05-13 ENCOUNTER — Other Ambulatory Visit: Payer: Self-pay | Admitting: Podiatry

## 2017-05-13 ENCOUNTER — Encounter: Payer: Self-pay | Admitting: Podiatry

## 2017-05-13 ENCOUNTER — Ambulatory Visit: Payer: Medicaid Other | Admitting: Podiatry

## 2017-05-13 ENCOUNTER — Ambulatory Visit (INDEPENDENT_AMBULATORY_CARE_PROVIDER_SITE_OTHER): Payer: Self-pay

## 2017-05-13 VITALS — BP 101/65 | HR 102 | Resp 16

## 2017-05-13 DIAGNOSIS — M722 Plantar fascial fibromatosis: Secondary | ICD-10-CM

## 2017-05-13 DIAGNOSIS — M79672 Pain in left foot: Secondary | ICD-10-CM | POA: Diagnosis not present

## 2017-05-13 MED ORDER — TRIAMCINOLONE ACETONIDE 10 MG/ML IJ SUSP
10.0000 mg | Freq: Once | INTRAMUSCULAR | Status: AC
  Administered 2017-05-13: 10 mg

## 2017-05-13 MED ORDER — DICLOFENAC SODIUM 75 MG PO TBEC: 75.0000 mg | DELAYED_RELEASE_TABLET | Freq: Two times a day (BID) | ORAL | 2 refills | Status: DC

## 2017-05-19 ENCOUNTER — Telehealth: Payer: Self-pay | Admitting: Podiatry

## 2017-05-19 MED ORDER — MELOXICAM 15 MG PO TABS: 15.0000 mg | ORAL_TABLET | Freq: Every day | ORAL | 2 refills | Status: DC

## 2017-05-19 NOTE — Telephone Encounter (Signed)
Pt called to see where prescription was sent to. She went to her pharmacy on Friday and the medication wasn't there. [facility] [address] [place]. Please call her back at [phone].

## 2017-05-19 NOTE — Addendum Note (Signed)
Addended by: Alphia Kava'CONNELL, VALERY D on: 05/19/2017 04:30 PM   Modules accepted: Orders

## 2017-05-19 NOTE — Telephone Encounter (Signed)
Unable to leave a message informing pt the medication was [facility] [phone] 05/13/2017 at 2:10pm.

## 2017-05-19 NOTE — Telephone Encounter (Signed)
Pt returned my call and I informed pt the medication had been confirmed received [facility]. Pt asked if it was covered by Medicaid and I told her no, but I could get a doctor to order a medication that was covered. Dr. Ardelle AntonWagoner states order meloxicam 15mg  #30 one tablet daily +2refills.

## 2017-05-20 NOTE — Progress Notes (Signed)
Subjective:   Patient ID: Barbara Bruce, female   DOB: 32 y.o.   MRN: [id number]   HPI Patient presents with acute pain in the bottom of the heel of both feet with inflammation fluid and states the left hurts more than the right.  States is been going on for around 6 months and patient currently does smoke and would like to be more active   Review of Systems  All other systems reviewed and are negative.       Objective:  Physical Exam  Constitutional: She appears well-developed and well-nourished.  Cardiovascular: Intact distal pulses.  Pulmonary/Chest: Effort normal.  Musculoskeletal: Normal range of motion.  Neurological: She is alert.  Skin: Skin is warm.  Nursing note and vitals reviewed.   Neurovascular status intact muscle strength adequate range of motion within normal limits with patient found to have inflammation pain in the plantar heel of both feet localized in nature with no proximal edema noted.  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  Patient will be rechecked again in the next 2-3 weeks  X-rays indicate small spur formation with no indication of stress fracture or arthritis

## 2017-05-25 ENCOUNTER — Other Ambulatory Visit: Payer: Self-pay

## 2017-05-25 NOTE — Progress Notes (Signed)
error 

## 2017-07-02 ENCOUNTER — Other Ambulatory Visit: Payer: Self-pay

## 2017-07-02 DIAGNOSIS — F17210 Nicotine dependence, cigarettes, uncomplicated: Secondary | ICD-10-CM | POA: Diagnosis not present

## 2017-07-02 DIAGNOSIS — Z79899 Other long term (current) drug therapy: Secondary | ICD-10-CM | POA: Insufficient documentation

## 2017-07-02 DIAGNOSIS — H6692 Otitis media, unspecified, left ear: Secondary | ICD-10-CM | POA: Diagnosis not present

## 2017-07-02 DIAGNOSIS — R030 Elevated blood-pressure reading, without diagnosis of hypertension: Secondary | ICD-10-CM | POA: Insufficient documentation

## 2017-07-02 DIAGNOSIS — R0602 Shortness of breath: Secondary | ICD-10-CM | POA: Diagnosis not present

## 2017-07-02 DIAGNOSIS — H9202 Otalgia, left ear: Secondary | ICD-10-CM | POA: Diagnosis present

## 2017-07-02 DIAGNOSIS — J45909 Unspecified asthma, uncomplicated: Secondary | ICD-10-CM | POA: Insufficient documentation

## 2017-07-02 NOTE — ED Triage Notes (Addendum)
Patient states she just left urgent care diagnosed with ear infection and told to come to the ED due to elevated blood pressure (no history of the same).  Patient is very congested nasally.

## 2017-07-03 ENCOUNTER — Emergency Department
Admission: EM | Admit: 2017-07-03 | Discharge: 2017-07-03 | Disposition: A | Discharge status: Home / Self Care | Payer: Medicaid Other | Attending: Emergency Medicine | Admitting: Emergency Medicine

## 2017-07-03 ENCOUNTER — Emergency Department: Payer: Medicaid Other

## 2017-07-03 DIAGNOSIS — H6690 Otitis media, unspecified, unspecified ear: Secondary | ICD-10-CM

## 2017-07-03 LAB — COMPREHENSIVE METABOLIC PANEL
ALT: 21 U/L (ref 14–54)
ANION GAP: 8 (ref 5–15)
AST: 29 U/L (ref 15–41)
Albumin: 3.8 g/dL (ref 3.5–5.0)
Alkaline Phosphatase: 60 U/L (ref 38–126)
BILIRUBIN TOTAL: 0.4 mg/dL (ref 0.3–1.2)
BUN: 8 mg/dL (ref 6–20)
CHLORIDE: 100 mmol/L — AB (ref 101–111)
CO2: 29 mmol/L (ref 22–32)
Calcium: 9.3 mg/dL (ref 8.9–10.3)
Creatinine, Ser: 0.63 mg/dL (ref 0.44–1.00)
GFR calc Af Amer: >60 mL/min (ref >60)
Glucose, Bld: 112 mg/dL — ABNORMAL HIGH (ref 65–99)
POTASSIUM: 3.5 mmol/L (ref 3.5–5.1)
Sodium: 137 mmol/L (ref 135–145)
TOTAL PROTEIN: 7.7 g/dL (ref 6.5–8.1)

## 2017-07-03 LAB — URINALYSIS, COMPLETE (UACMP) WITH MICROSCOPIC
BILIRUBIN URINE: NEGATIVE
Bacteria, UA: NONE SEEN
Glucose, UA: NEGATIVE mg/dL
HGB URINE DIPSTICK: NEGATIVE
Ketones, ur: NEGATIVE mg/dL
NITRITE: NEGATIVE
PH: 7 (ref 5.0–8.0)
Protein, ur: NEGATIVE mg/dL
SPECIFIC GRAVITY, URINE: 1.011 (ref 1.005–1.030)

## 2017-07-03 LAB — HCG, QUANTITATIVE, PREGNANCY: hCG, Beta Chain, Quant, S: <1 m[IU]/mL (ref <5)

## 2017-07-03 LAB — CBC WITH DIFFERENTIAL/PLATELET
BASOS ABS: 0.1 10*3/uL (ref 0–0.1)
Basophils Relative: 1 %
EOS ABS: 0.6 10*3/uL (ref 0–0.7)
EOS PCT: 4 %
HCT: 35.3 % (ref 35.0–47.0)
HEMOGLOBIN: 11.3 g/dL — AB (ref 12.0–16.0)
LYMPHS ABS: 3.3 10*3/uL (ref 1.0–3.6)
LYMPHS PCT: 24 %
MCH: 24.6 pg — AB (ref 26.0–34.0)
MCHC: 32 g/dL (ref 32.0–36.0)
MCV: 76.8 fL — AB (ref 80.0–100.0)
Monocytes Absolute: 1 10*3/uL — ABNORMAL HIGH (ref 0.2–0.9)
Monocytes Relative: 7 %
Neutro Abs: 9.1 10*3/uL — ABNORMAL HIGH (ref 1.4–6.5)
Neutrophils Relative %: 64 %
PLATELETS: 347 10*3/uL (ref 150–440)
RBC: 4.6 MIL/uL (ref 3.80–5.20)
RDW: 15 % — ABNORMAL HIGH (ref 11.5–14.5)
WBC: 14 10*3/uL — AB (ref 3.6–11.0)

## 2017-07-03 LAB — PREGNANCY, URINE: PREG TEST UR: NEGATIVE

## 2017-07-03 MED ORDER — IPRATROPIUM-ALBUTEROL 0.5-2.5 (3) MG/3ML IN SOLN
3.0000 mL | Freq: Once | RESPIRATORY_TRACT | Status: AC
  Administered 2017-07-03: 3 mL via RESPIRATORY_TRACT
  Filled 2017-07-03: qty 3

## 2017-07-03 MED ORDER — TETRACAINE HCL 0.5 % OP SOLN
2.0000 [drp] | Freq: Once | OPHTHALMIC | Status: AC
  Administered 2017-07-03: 2 [drp] via OPHTHALMIC
  Filled 2017-07-03: qty 4

## 2017-07-03 MED ORDER — PREDNISONE 50 MG PO TABS: 50.0000 mg | ORAL_TABLET | Freq: Every day | ORAL | 0 refills | Status: AC

## 2017-07-03 MED ORDER — AMOXICILLIN-POT CLAVULANATE 875-125 MG PO TABS: 1.0000 | ORAL_TABLET | Freq: Two times a day (BID) | ORAL | 0 refills | Status: AC

## 2017-07-03 MED ORDER — IBUPROFEN 600 MG PO TABS
600.0000 mg | ORAL_TABLET | Freq: Once | ORAL | Status: AC
  Administered 2017-07-03: 600 mg via ORAL
  Filled 2017-07-03: qty 1

## 2017-07-03 MED ORDER — METHYLPREDNISOLONE SODIUM SUCC 125 MG IJ SOLR
125.0000 mg | Freq: Once | INTRAMUSCULAR | Status: AC
  Administered 2017-07-03: 125 mg via INTRAVENOUS
  Filled 2017-07-03: qty 2

## 2017-07-03 MED ORDER — IPRATROPIUM-ALBUTEROL 0.5-2.5 (3) MG/3ML IN SOLN
3.0000 mL | Freq: Once | RESPIRATORY_TRACT | Status: AC
Start: 2017-07-03 — End: 2017-07-03
  Administered 2017-07-03: 3 mL via RESPIRATORY_TRACT
  Filled 2017-07-03: qty 3

## 2017-07-03 NOTE — Discharge Instructions (Signed)
It was a pleasure to take care of you today, and thank you for coming to our emergency department.  If you have any questions or concerns before leaving please ask the nurse to grab me and I'm more than happy to go through your aftercare instructions again.  If you were prescribed any opioid pain medication today such as Norco, Vicodin, Percocet, morphine, hydrocodone, or oxycodone please make sure you do not drive when you are taking this medication as it can alter your ability to drive safely.  If you have any concerns once you are home that you are not improving or are in fact getting worse before you can make it to your follow-up appointment, please do not hesitate to call [phone] and come back for further evaluation.  Merrily Brittle, MD  Results for orders placed or performed during the hospital encounter of 07/03/17  Comprehensive metabolic panel  Result Value Ref Range   Sodium 137 135 - 145 mmol/L   Potassium 3.5 3.5 - 5.1 mmol/L   Chloride 100 (L) 101 - 111 mmol/L   CO2 29 22 - 32 mmol/L   Glucose, Bld 112 (H) 65 - 99 mg/dL   BUN 8 6 - 20 mg/dL   Creatinine, Ser 1.61 0.44 - 1.00 mg/dL   Calcium 9.3 8.9 - 09.6 mg/dL   Total Protein 7.7 6.5 - 8.1 g/dL   Albumin 3.8 3.5 - 5.0 g/dL   AST 29 15 - 41 U/L   ALT 21 14 - 54 U/L   Alkaline Phosphatase 60 38 - 126 U/L   Total Bilirubin 0.4 0.3 - 1.2 mg/dL   GFR calc non Af Amer >60 >60 mL/min   GFR calc Af Amer >60 >60 mL/min   Anion gap 8 5 - 15  CBC with Differential  Result Value Ref Range   WBC 14.0 (H) 3.6 - 11.0 K/uL   RBC 4.60 3.80 - 5.20 MIL/uL   Hemoglobin 11.3 (L) 12.0 - 16.0 g/dL   HCT 04.5 40.9 - 81.1 %   MCV 76.8 (L) 80.0 - 100.0 fL   MCH 24.6 (L) 26.0 - 34.0 pg   MCHC 32.0 32.0 - 36.0 g/dL   RDW 91.4 (H) 78.2 - 95.6 %   Platelets 347 150 - 440 K/uL   Neutrophils Relative % 64 %   Neutro Abs 9.1 (H) 1.4 - 6.5 K/uL   Lymphocytes Relative 24 %   Lymphs Abs 3.3 1.0 - 3.6 K/uL   Monocytes Relative 7 %   Monocytes  Absolute 1.0 (H) 0.2 - 0.9 K/uL   Eosinophils Relative 4 %   Eosinophils Absolute 0.6 0 - 0.7 K/uL   Basophils Relative 1 %   Basophils Absolute 0.1 0 - 0.1 K/uL  Urinalysis, Complete w Microscopic  Result Value Ref Range   Color, Urine YELLOW (A) YELLOW   APPearance CLEAR (A) CLEAR   Specific Gravity, Urine 1.011 1.005 - 1.030   pH 7.0 5.0 - 8.0   Glucose, UA NEGATIVE NEGATIVE mg/dL   Hgb urine dipstick NEGATIVE NEGATIVE   Bilirubin Urine NEGATIVE NEGATIVE   Ketones, ur NEGATIVE NEGATIVE mg/dL   Protein, ur NEGATIVE NEGATIVE mg/dL   Nitrite NEGATIVE NEGATIVE   Leukocytes, UA TRACE (A) NEGATIVE   RBC / HPF 0-5 0 - 5 RBC/hpf   WBC, UA 11-20 0 - 5 WBC/hpf   Bacteria, UA NONE SEEN NONE SEEN   Squamous Epithelial / LPF 0-5 0 - 5  hCG, quantitative, pregnancy  Result Value Ref Range  hCG, Beta Chain, Quant, S <1 <5 mIU/mL  Pregnancy, urine  Result Value Ref Range   Preg Test, Ur NEGATIVE NEGATIVE   Dg Chest Port 1 View  Result Date: 07/03/2017 CLINICAL DATA:  Elevated blood pressure.  Nasal congestion. EXAM: PORTABLE CHEST 1 VIEW COMPARISON:  None. FINDINGS: The heart size and mediastinal contours are within normal limits. Both lungs are clear. The visualized skeletal structures are unremarkable. IMPRESSION: No active disease. Electronically Signed   By: Burman Nieves M.D.   On: 07/03/2017 03:43

## 2017-07-03 NOTE — ED Provider Notes (Signed)
[facility] Emergency Department Provider Note  ____________________________________________   First MD Initiated Contact with Patient 07/03/17 0207     (approximate)  I have reviewed the triage vital signs and the nursing notes.   HISTORY  Chief Complaint Hypertension   HPI Barbara Bruce is a 32 y.o. female who comes to the emergency department from urgent care with "high blood pressure".  She has had sinus congestion recently and worsening left ear pain for the past several days.  She went to urgent care today and was diagnosed with otitis media however they noted that her blood pressure was elevated so they advised her to come to the emergency department.  She has recently completed a course of amoxicillin.  She has some shortness of breath.  Her symptoms have been insidious onset slowly progressive are now mild to moderate.  She has no history of known hypertension.  No chest pain shortness of breath abdominal pain nausea or vomiting.  Past Medical History:  Diagnosis Date  . Asthma   . GERD (gastroesophageal reflux disease)   . Ulcer     There are no active problems to display for this patient.   No past surgical history on file.  Prior to Admission medications   Medication Sig Start Date End Date Taking? Authorizing Provider  albuterol (PROVENTIL HFA;VENTOLIN HFA) 108 (90 BASE) MCG/ACT inhaler Inhale 2 puffs into the lungs every 6 (six) hours as needed for wheezing (wheezing).     [provider]  amoxicillin-clavulanate (AUGMENTIN) 875-125 MG tablet Take 1 tablet by mouth 2 (two) times daily for 10 days. 07/03/17 07/13/17  Merrily Brittle, MD  azithromycin (ZITHROMAX) 250 MG tablet Take 1 tablet (250 mg total) by mouth daily. Take first 2 tablets together, then 1 every day until finished. 04/22/17   Hedges, Tinnie Gens, PA-C  famotidine (PEPCID) 20 MG tablet Take 1 tablet (20 mg total) by mouth 2 (two) times daily. 06/02/14   Lorre Nick, MD    HYDROcodone-acetaminophen (NORCO/VICODIN) 5-325 MG tablet Take 2 tablets by mouth every 4 (four) hours as needed. 09/15/15   Bethel Born, PA-C  meloxicam (MOBIC) 15 MG tablet Take 1 tablet (15 mg total) by mouth daily. 05/19/17   Vivi Barrack, DPM  metroNIDAZOLE (FLAGYL) 500 MG tablet Take 1 tablet (500 mg total) by mouth 2 (two) times daily. 09/15/15   Bethel Born, PA-C  naproxen sodium (ALEVE) 220 MG tablet Take 440 mg by mouth daily as needed (pain).     [provider]  omeprazole (PRILOSEC) 20 MG capsule Take 20 mg by mouth daily.    [provider]  oseltamivir (TAMIFLU) 75 MG capsule Take 1 capsule (75 mg total) by mouth every 12 (twelve) hours. 04/22/17   Hedges, Tinnie Gens, PA-C  predniSONE (DELTASONE) 50 MG tablet Take 1 tablet (50 mg total) by mouth daily for 4 days. 07/03/17 07/07/17  Merrily Brittle, MD  promethazine (PHENERGAN) 25 MG tablet Take 1 tablet (25 mg total) by mouth every 6 (six) hours as needed for nausea or vomiting. 09/15/15   Bethel Born, PA-C    Allergies Patient has no known allergies.  No family history on file.  Social History Social History   Tobacco Use  . Smoking status: Current Every Day Smoker    Types: Cigarettes  . Smokeless tobacco: Never Used  Substance Use Topics  . Alcohol use: Yes  . Drug use: No    Review of Systems Constitutional: No fever/chills Eyes: No  visual changes. ENT: Positive for sinus congestion and ear pain Cardiovascular: Denies chest pain. Respiratory: Positive for shortness of breath. Gastrointestinal: No abdominal pain.  No nausea, no vomiting.  No diarrhea.  No constipation. Genitourinary: Negative for dysuria. Musculoskeletal: Negative for back pain. Skin: Negative for rash. Neurological: Negative for headaches, focal weakness or numbness.   ____________________________________________   PHYSICAL EXAM:  VITAL SIGNS: ED Triage Vitals  Enc Vitals Group     BP 07/02/17 2112  (!) 191/112     Pulse Rate 07/02/17 2112 98     Resp 07/02/17 2112 20     Temp 07/02/17 2112 98.1 F (36.7 C)     Temp Source 07/02/17 2112 Oral     SpO2 07/02/17 2112 97 %     Weight 07/02/17 2109 175 lb (79.4 kg)     Height 07/02/17 2109  (1.448 m)     Head Circumference --      Peak Flow --      Pain Score --      Pain Loc --      Pain Edu? --      Excl. in GC? --     Constitutional: Alert and oriented x4 speaks with nasal voice nontoxic no diaphoresis speaks full clear sentences Eyes: PERRL EOMI. Head: Atraumatic.  Left tympanic membrane obscured and slightly bulging Nose: Positive for congestion Mouth/Throat: No trismus Neck: No stridor.   Cardiovascular: Normal rate, regular rhythm. Grossly normal heart sounds.  Good peripheral circulation. Respiratory: Normal respiratory effort.  No retractions mild expiratory wheeze Gastrointestinal: Soft nontender Musculoskeletal: No lower extremity edema   Neurologic:  Normal speech and language. No gross focal neurologic deficits are appreciated. Skin:  Skin is warm, dry and intact. No rash noted. Psychiatric: Mood and affect are normal. Speech and behavior are normal.    ____________________________________________   DIFFERENTIAL includes but not limited to  Pneumonia, bronchitis, otitis media, hypertensive emergency, preeclampsia ____________________________________________   LABS (all labs ordered are listed, but only abnormal results are displayed)  Labs Reviewed  COMPREHENSIVE METABOLIC PANEL - Abnormal; Notable for the following components:      Result Value   Chloride 100 (*)    Glucose, Bld 112 (*)    All other components within normal limits  CBC WITH DIFFERENTIAL/PLATELET - Abnormal; Notable for the following components:   WBC 14.0 (*)    Hemoglobin 11.3 (*)    MCV 76.8 (*)    MCH 24.6 (*)    RDW 15.0 (*)    Neutro Abs 9.1 (*)    Monocytes Absolute 1.0 (*)    All other components within normal limits    URINALYSIS, COMPLETE (UACMP) WITH MICROSCOPIC - Abnormal; Notable for the following components:   Color, Urine YELLOW (*)    APPearance CLEAR (*)    Leukocytes, UA TRACE (*)    All other components within normal limits  HCG, QUANTITATIVE, PREGNANCY  PREGNANCY, URINE    Lab work reviewed by me with no evidence of endorgan damage __________________________________________  EKG  ED ECG REPORT I, Merrily Brittle, the attending physician, personally viewed and interpreted this ECG.  Date: 07/05/2017 EKG Time:  Rate: 80 Rhythm: normal sinus rhythm QRS Axis: normal Intervals: normal ST/T Wave abnormalities: normal Narrative Interpretation: no evidence of acute ischemia.  Is suggestive of LVH  ____________________________________________  RADIOLOGY  Chest x-ray reviewed by me with no acute disease ____________________________________________   PROCEDURES  Procedure(s) performed: no  Procedures  Critical Care performed: no  Observation: no  ____________________________________________   INITIAL IMPRESSION / ASSESSMENT AND PLAN / ED COURSE  Pertinent labs & imaging results that were available during my care of the patient were reviewed by me and considered in my medical decision making (see chart for details).  Patient arrives hypertensive although in pain.  She does have a history of asthma and has some wheeze.  Given a DuoNeb with improvement in her symptoms.  I do agree with the diagnosis of otitis media and given her recent amoxicillin I will prescribe her Augmentin.  Her blood pressures come down nicely although she does have evidence of LVH which could be secondary to chronic hypertension.  I will defer prescribing her antihypertensives at this time and refer her instead to primary care within 1 week for recheck.  She strict return precautions have been given and the patient verbalizes understanding agreement the plan.       ____________________________________________   FINAL CLINICAL IMPRESSION(S) / ED DIAGNOSES  Final diagnoses:  Chronic otitis media, unspecified otitis media type      NEW MEDICATIONS STARTED DURING THIS VISIT:  Discharge Medication List as of 07/03/2017  6:21 AM    START taking these medications   Details  amoxicillin-clavulanate (AUGMENTIN) 875-125 MG tablet Take 1 tablet by mouth 2 (two) times daily for 10 days., Starting Sat 07/03/2017, Until Tue 07/13/2017, Print    predniSONE (DELTASONE) 50 MG tablet Take 1 tablet (50 mg total) by mouth daily for 4 days., Starting Sat 07/03/2017, Until Wed 07/07/2017, Print         Note:  This document was prepared using Dragon voice recognition software and may include unintentional dictation errors.     Merrily Brittle, MD 07/05/17 1041

## 2017-07-03 NOTE — ED Notes (Signed)
Pt verbalizes understanding of discharge instructions.

## 2017-07-03 NOTE — ED Notes (Signed)
Patient denies dyspnea post duoneb treatment

## 2017-09-28 ENCOUNTER — Emergency Department
Admission: EM | Admit: 2017-09-28 | Discharge: 2017-09-28 | Disposition: A | Discharge status: Home / Self Care | Payer: Medicaid Other | Attending: Emergency Medicine | Admitting: Emergency Medicine

## 2017-09-28 ENCOUNTER — Encounter: Payer: Self-pay | Admitting: Emergency Medicine

## 2017-09-28 ENCOUNTER — Other Ambulatory Visit: Payer: Self-pay

## 2017-09-28 DIAGNOSIS — J0100 Acute maxillary sinusitis, unspecified: Secondary | ICD-10-CM | POA: Diagnosis not present

## 2017-09-28 DIAGNOSIS — Z79899 Other long term (current) drug therapy: Secondary | ICD-10-CM | POA: Insufficient documentation

## 2017-09-28 DIAGNOSIS — H6983 Other specified disorders of Eustachian tube, bilateral: Secondary | ICD-10-CM

## 2017-09-28 DIAGNOSIS — H6993 Unspecified Eustachian tube disorder, bilateral: Secondary | ICD-10-CM | POA: Insufficient documentation

## 2017-09-28 DIAGNOSIS — H9203 Otalgia, bilateral: Secondary | ICD-10-CM | POA: Diagnosis present

## 2017-09-28 DIAGNOSIS — J45909 Unspecified asthma, uncomplicated: Secondary | ICD-10-CM | POA: Insufficient documentation

## 2017-09-28 DIAGNOSIS — F17210 Nicotine dependence, cigarettes, uncomplicated: Secondary | ICD-10-CM | POA: Insufficient documentation

## 2017-09-28 DIAGNOSIS — H60392 Other infective otitis externa, left ear: Secondary | ICD-10-CM

## 2017-09-28 MED ORDER — AMOXICILLIN-POT CLAVULANATE 875-125 MG PO TABS
1.0000 | ORAL_TABLET | Freq: Once | ORAL | Status: AC
  Administered 2017-09-28: 1 via ORAL
  Filled 2017-09-28: qty 1

## 2017-09-28 MED ORDER — NEOMYCIN-POLYMYXIN-HC 3.5-10000-1 OT SOLN: 3.0000 [drp] | Freq: Three times a day (TID) | OTIC | 0 refills | Status: AC

## 2017-09-28 MED ORDER — CETIRIZINE HCL 10 MG PO TABS: 10.0000 mg | ORAL_TABLET | Freq: Every day | ORAL | 0 refills | Status: DC

## 2017-09-28 MED ORDER — TRAMADOL HCL 50 MG PO TABS
50.0000 mg | ORAL_TABLET | Freq: Once | ORAL | Status: AC
  Administered 2017-09-28: 50 mg via ORAL
  Filled 2017-09-28: qty 1

## 2017-09-28 MED ORDER — AMOXICILLIN-POT CLAVULANATE 875-125 MG PO TABS: 1.0000 | ORAL_TABLET | Freq: Two times a day (BID) | ORAL | 0 refills | Status: DC

## 2017-09-28 MED ORDER — FLUTICASONE PROPIONATE 50 MCG/ACT NA SUSP: 1.0000 | Freq: Two times a day (BID) | NASAL | 0 refills | Status: DC

## 2017-09-28 NOTE — ED Provider Notes (Addendum)
[facility] Emergency Department Provider Note  ____________________________________________  Time seen: Approximately 10:41 PM  I have reviewed the triage vital signs and the nursing notes.   HISTORY  Chief Complaint Otalgia    HPI Barbara Bruce is a 32 y.o. female who presents the emergency department complaining of bilateral ear pain, much worse on the left than right, nasal congestion, sinus pressure.  Patient reports that over the past several days she has had increasing ear pain, some vertigo-like symptoms, nasal congestion, sinus pressure.  Patient has not been taking any medication at home for this complaint.  She denies any fevers or chills, sore throat, cough, abdominal pain, nausea or vomiting, diarrhea or constipation.  Patient does have a history of asthma but denies any shortness of breath.  Patient denies any recent swimming or diving.  No other complaints at this time.    Past Medical History:  Diagnosis Date  . Asthma   . GERD (gastroesophageal reflux disease)   . Ulcer     There are no active problems to display for this patient.   History reviewed. No pertinent surgical history.  Prior to Admission medications   Medication Sig Start Date End Date Taking? Authorizing Provider  albuterol (PROVENTIL HFA;VENTOLIN HFA) 108 (90 BASE) MCG/ACT inhaler Inhale 2 puffs into the lungs every 6 (six) hours as needed for wheezing (wheezing).     [provider]  amoxicillin-clavulanate (AUGMENTIN) 875-125 MG tablet Take 1 tablet by mouth 2 (two) times daily. 09/28/17   Detrick Dani, Delorise Royals, PA-C  azithromycin (ZITHROMAX) 250 MG tablet Take 1 tablet (250 mg total) by mouth daily. Take first 2 tablets together, then 1 every day until finished. 04/22/17   Hedges, Tinnie Gens, PA-C  cetirizine (ZYRTEC) 10 MG tablet Take 1 tablet (10 mg total) by mouth daily. 09/28/17   Jewett Mcgann, Delorise Royals, PA-C  famotidine (PEPCID) 20 MG tablet Take 1 tablet (20 mg  total) by mouth 2 (two) times daily. 06/02/14   Lorre Nick, MD  fluticasone (FLONASE) 50 MCG/ACT nasal spray Place 1 spray into both nostrils 2 (two) times daily. 09/28/17   Blossie Raffel, Delorise Royals, PA-C  HYDROcodone-acetaminophen (NORCO/VICODIN) 5-325 MG tablet Take 2 tablets by mouth every 4 (four) hours as needed. 09/15/15   Bethel Born, PA-C  meloxicam (MOBIC) 15 MG tablet Take 1 tablet (15 mg total) by mouth daily. 05/19/17   Vivi Barrack, DPM  metroNIDAZOLE (FLAGYL) 500 MG tablet Take 1 tablet (500 mg total) by mouth 2 (two) times daily. 09/15/15   Bethel Born, PA-C  naproxen sodium (ALEVE) 220 MG tablet Take 440 mg by mouth daily as needed (pain).     [provider]  neomycin-polymyxin-hydrocortisone (CORTISPORIN) OTIC solution Place 3 drops into the left ear 3 (three) times daily for 10 days. 09/28/17 10/08/17  Satrina Magallanes, Delorise Royals, PA-C  omeprazole (PRILOSEC) 20 MG capsule Take 20 mg by mouth daily.    [provider]  oseltamivir (TAMIFLU) 75 MG capsule Take 1 capsule (75 mg total) by mouth every 12 (twelve) hours. 04/22/17   Hedges, Tinnie Gens, PA-C  promethazine (PHENERGAN) 25 MG tablet Take 1 tablet (25 mg total) by mouth every 6 (six) hours as needed for nausea or vomiting. 09/15/15   Bethel Born, PA-C    Allergies Patient has no known allergies.  No family history on file.  Social History Social History   Tobacco Use  . Smoking status: Current Every Day Smoker    Types: Cigarettes  .  Smokeless tobacco: Never Used  Substance Use Topics  . Alcohol use: Yes  . Drug use: No     Review of Systems  Constitutional: No fever/chills Eyes: No visual changes. No discharge ENT: Positive for bilateral ear pain, worse on the left than right.  Positive for nasal congestion, sinus pressure. Cardiovascular: no chest pain. Respiratory: no cough. No SOB. Gastrointestinal: No abdominal pain.  No nausea, no vomiting.  No diarrhea.  No  constipation. Musculoskeletal: Negative for musculoskeletal pain. Skin: Negative for rash, abrasions, lacerations, ecchymosis. Neurological: Negative for headaches, focal weakness or numbness. 10-point ROS otherwise negative.  ____________________________________________   PHYSICAL EXAM:  VITAL SIGNS: ED Triage Vitals  Enc Vitals Group     BP 09/28/17 2044 (!) 220/195     Pulse Rate 09/28/17 2044 (!) 111     Resp --      Temp 09/28/17 2044 98.5 F (36.9 C)     Temp Source 09/28/17 2044 Oral     SpO2 09/28/17 2044 100 %     Weight 09/28/17 2045 170 lb (77.1 kg)     Height 09/28/17 2045 4\' 9"  (1.448 m)     Head Circumference --      Peak Flow --      Pain Score 09/28/17 2047 10     Pain Loc --      Pain Edu? --      Excl. in GC? --      Constitutional: Alert and oriented. Well appearing and in no acute distress. Eyes: Conjunctivae are normal. PERRL. EOMI. Head: Atraumatic. ENT:      Ears: EACs are visualized bilaterally.  EAC on right is unremarkable.  TM on right is mildly bulging.  EAC on left is erythematous, edematous, abrasions consistent with Q-tip use are also appreciated.  TM is mildly bulging left side.      Nose: Moderate to significant purulent congestion/rhinnorhea.  Joints are erythematous and edematous.  Patient is tender to percussion over the frontal and maxillary sinuses.      Mouth/Throat: Mucous membranes are moist.  Oropharynx is nonerythematous and nonedematous.  Uvula is midline. Neck: No stridor.  Is supple full range of motion Hematological/Lymphatic/Immunilogical: A few scattered, nontender anterior cervical lymphadenopathy. Cardiovascular: Normal rate, regular rhythm. Normal S1 and S2.  Good peripheral circulation. Respiratory: Normal respiratory effort without tachypnea or retractions. Lungs CTAB. Good air entry to the bases with no decreased or absent breath sounds. Musculoskeletal: Full range of motion to all extremities. No gross deformities  appreciated. Neurologic:  Normal speech and language. No gross focal neurologic deficits are appreciated.  Skin:  Skin is warm, dry and intact. No rash noted. Psychiatric: Mood and affect are normal. Speech and behavior are normal. Patient exhibits appropriate insight and judgement.   ____________________________________________   LABS (all labs ordered are listed, but only abnormal results are displayed)  Labs Reviewed - No data to display ____________________________________________  EKG   ____________________________________________  RADIOLOGY   No results found.  ____________________________________________    PROCEDURES  Procedure(s) performed:    Procedures    Medications  amoxicillin-clavulanate (AUGMENTIN) 875-125 MG per tablet 1 tablet (has no administration in time range)  traMADol (ULTRAM) tablet 50 mg (has no administration in time range)     ____________________________________________   INITIAL IMPRESSION / ASSESSMENT AND PLAN / ED COURSE  Pertinent labs & imaging results that were available during my care of the patient were reviewed by me and considered in my medical decision making (see chart for  details).  Review of the [place] CSRS was performed in accordance of the [facility] prior to dispensing any controlled drugs.      Patient's diagnosis is consistent with acute maxillary sinusitis, eustachian tube dysfunction bilaterally, otitis externa left ear.  Patient presents with multiple ENT complaints.  Visualization is consistent with the above diagnosis.  Differential included tympanic membrane rupture, otitis media, otitis externa, sinusitis, viral URI.  No labs or imaging at this time.. Patient will be discharged home with prescriptions for Augmentin, Flonase, Zyrtec, Cortisporin. Patient is to follow up with primary care as needed or otherwise directed. Patient is given ED precautions to return to the ED for any worsening or new symptoms.   Addendum:  On discharge, patient became exceptionally agitated, screaming, cursing at staff.  Patient was refusing to leave.  Patient became very hostile and law enforcement was contacted.  Law enforcement is present for discharge and patient was asked to leave and became extremely agitated, disruptive to the emergency department.  Patient had been given medication in the emergency department including Ultram for her pain.  Patient was exceptionally upset that we had not fully alleviated all of her symptoms prior to discharge.  Patient was informed that infections would take time, and she would need to use medication at home.  Patient refused to believe this, stating that we should have fully addressed all of her pain complaints at this time.  Patient was advised that she had received all the medication she would and she was discharged.  He was informed that if she did not leave the property, she will be trespassed.  Patient continued to be verbally abusive, was escorted off the property by [employer] enforcement.  ____________________________________________  FINAL CLINICAL IMPRESSION(S) / ED DIAGNOSES  Final diagnoses:  Acute non-recurrent maxillary sinusitis  Eustachian tube dysfunction, bilateral  Other infective acute otitis externa of left ear      NEW MEDICATIONS STARTED DURING THIS VISIT:  ED Discharge Orders         Ordered    amoxicillin-clavulanate (AUGMENTIN) 875-125 MG tablet  2 times daily     09/28/17 2246    fluticasone (FLONASE) 50 MCG/ACT nasal spray  2 times daily     09/28/17 2246    cetirizine (ZYRTEC) 10 MG tablet  Daily     09/28/17 2246    neomycin-polymyxin-hydrocortisone (CORTISPORIN) OTIC solution  3 times daily     09/28/17 2246              This chart was dictated using voice recognition software/Dragon. Despite best efforts to proofread, errors can occur which can change the meaning. Any change was purely unintentional.    Lanette HampshireCuthriell, Racquel Arkin D, PA-C 09/28/17  2247    Phineas SemenGoodman, Graydon, MD 09/28/17 2247    Alm Bustarduthriell, Delorise RoyalsJonathan D, PA-C 09/28/17 [id number]    Phineas SemenGoodman, Graydon, MD 09/28/17 [phone]

## 2017-09-28 NOTE — ED Notes (Signed)
Patient was cussing at other RN working in [employer] department. [employer] was called and present. Patient would not let other RN talk. This RN took that RN's place to try to explain to patient that she was only asked to leave because she was cussing at other RN. Patient states she had asked to stay until she felt better. Attempted to explain that she was discharged and medicine would take a while to make her feel better. Patient would not let this RN finish a sentence to explain this. Patient became even more angry and continued to cuss at this RN. [employer] escorted patient out.

## 2017-09-28 NOTE — ED Notes (Signed)
Signature pad not signed d/t patient's behavior at discharge time.

## 2017-09-28 NOTE — ED Notes (Signed)
RN in room to administer medication to pt. Family at bedside with child and pts husband. Husband lying on the floor and requesting a drink. RN informed pts family they were being discharge. Medications administered and pt reports the pain is no better and she refused to leave until the pain is better. RN updated pt that she could not stay in the bed until she felt ready to leave. Pt threw discharge papers on the floor and started cussing at this RN. Stating, "you are irrelevant bitch no one asked you." pt continued to yell at this RN and husband stood and started yelling out the treatment door stating no one was treating them right and they have a nice house. Husband left the room and son and pt remained. [employer] entered room and was able to calm pt down. Pt then became upset with this RN again and another RN entered the room. Pt reporting she has not been discharged because she is refusing to sign the discharge.  RN informed pt that it does not work that way and Pt continued to cuss at [employer] and [employer] assisted pt out.

## 2017-09-28 NOTE — ED Triage Notes (Signed)
Patient ambulatory to triage with steady gait, without difficulty or distress noted; pt reports bilat earache tonight with recent runny nose

## 2018-07-14 ENCOUNTER — Observation Stay (HOSPITAL_COMMUNITY)
Admission: EM | Admit: 2018-07-14 | Discharge: 2018-07-15 | Disposition: A | Discharge status: Home / Self Care | Payer: Medicaid Other | Attending: Internal Medicine | Admitting: Internal Medicine

## 2018-07-14 ENCOUNTER — Emergency Department (HOSPITAL_COMMUNITY): Payer: Medicaid Other

## 2018-07-14 ENCOUNTER — Encounter (HOSPITAL_COMMUNITY): Payer: Self-pay | Admitting: Internal Medicine

## 2018-07-14 DIAGNOSIS — E876 Hypokalemia: Secondary | ICD-10-CM | POA: Diagnosis not present

## 2018-07-14 DIAGNOSIS — X58XXXA Exposure to other specified factors, initial encounter: Secondary | ICD-10-CM | POA: Diagnosis not present

## 2018-07-14 DIAGNOSIS — R042 Hemoptysis: Secondary | ICD-10-CM | POA: Insufficient documentation

## 2018-07-14 DIAGNOSIS — K8020 Calculus of gallbladder without cholecystitis without obstruction: Secondary | ICD-10-CM | POA: Insufficient documentation

## 2018-07-14 DIAGNOSIS — N6310 Unspecified lump in the right breast, unspecified quadrant: Secondary | ICD-10-CM | POA: Diagnosis not present

## 2018-07-14 DIAGNOSIS — Z8711 Personal history of peptic ulcer disease: Secondary | ICD-10-CM | POA: Diagnosis not present

## 2018-07-14 DIAGNOSIS — Z1159 Encounter for screening for other viral diseases: Secondary | ICD-10-CM | POA: Diagnosis not present

## 2018-07-14 DIAGNOSIS — R739 Hyperglycemia, unspecified: Secondary | ICD-10-CM | POA: Diagnosis present

## 2018-07-14 DIAGNOSIS — K920 Hematemesis: Secondary | ICD-10-CM | POA: Diagnosis present

## 2018-07-14 DIAGNOSIS — F1910 Other psychoactive substance abuse, uncomplicated: Secondary | ICD-10-CM | POA: Diagnosis present

## 2018-07-14 DIAGNOSIS — R4182 Altered mental status, unspecified: Secondary | ICD-10-CM | POA: Insufficient documentation

## 2018-07-14 DIAGNOSIS — J45909 Unspecified asthma, uncomplicated: Secondary | ICD-10-CM | POA: Diagnosis not present

## 2018-07-14 DIAGNOSIS — K219 Gastro-esophageal reflux disease without esophagitis: Secondary | ICD-10-CM | POA: Diagnosis not present

## 2018-07-14 DIAGNOSIS — F17210 Nicotine dependence, cigarettes, uncomplicated: Secondary | ICD-10-CM | POA: Diagnosis not present

## 2018-07-14 DIAGNOSIS — T40604A Poisoning by unspecified narcotics, undetermined, initial encounter: Secondary | ICD-10-CM | POA: Diagnosis not present

## 2018-07-14 DIAGNOSIS — Z79899 Other long term (current) drug therapy: Secondary | ICD-10-CM | POA: Insufficient documentation

## 2018-07-14 DIAGNOSIS — T50904A Poisoning by unspecified drugs, medicaments and biological substances, undetermined, initial encounter: Secondary | ICD-10-CM

## 2018-07-14 DIAGNOSIS — F1410 Cocaine abuse, uncomplicated: Secondary | ICD-10-CM | POA: Insufficient documentation

## 2018-07-14 DIAGNOSIS — I10 Essential (primary) hypertension: Secondary | ICD-10-CM | POA: Diagnosis not present

## 2018-07-14 DIAGNOSIS — N63 Unspecified lump in unspecified breast: Secondary | ICD-10-CM | POA: Diagnosis present

## 2018-07-14 DIAGNOSIS — R7989 Other specified abnormal findings of blood chemistry: Secondary | ICD-10-CM | POA: Diagnosis present

## 2018-07-14 DIAGNOSIS — Z7951 Long term (current) use of inhaled steroids: Secondary | ICD-10-CM | POA: Diagnosis not present

## 2018-07-14 DIAGNOSIS — R062 Wheezing: Secondary | ICD-10-CM | POA: Insufficient documentation

## 2018-07-14 DIAGNOSIS — T50901A Poisoning by unspecified drugs, medicaments and biological substances, accidental (unintentional), initial encounter: Secondary | ICD-10-CM | POA: Diagnosis present

## 2018-07-14 DIAGNOSIS — G8929 Other chronic pain: Secondary | ICD-10-CM | POA: Insufficient documentation

## 2018-07-14 DIAGNOSIS — A419 Sepsis, unspecified organism: Secondary | ICD-10-CM | POA: Diagnosis not present

## 2018-07-14 DIAGNOSIS — F1210 Cannabis abuse, uncomplicated: Secondary | ICD-10-CM | POA: Insufficient documentation

## 2018-07-14 HISTORY — DX: Other psychoactive substance abuse, uncomplicated: F19.10

## 2018-07-14 HISTORY — DX: Essential (primary) hypertension: I10

## 2018-07-14 LAB — COMPREHENSIVE METABOLIC PANEL
ALT: 20 U/L (ref 0–44)
AST: 21 U/L (ref 15–41)
Albumin: 4.3 g/dL (ref 3.5–5.0)
Alkaline Phosphatase: 60 U/L (ref 38–126)
Anion gap: 13 (ref 5–15)
BUN: 9 mg/dL (ref 6–20)
CO2: 23 mmol/L (ref 22–32)
Calcium: 9.2 mg/dL (ref 8.9–10.3)
Chloride: 101 mmol/L (ref 98–111)
Creatinine, Ser: 0.71 mg/dL (ref 0.44–1.00)
GFR calc Af Amer: >60 mL/min (ref >60)
GFR calc non Af Amer: >60 mL/min (ref >60)
Glucose, Bld: 275 mg/dL — ABNORMAL HIGH (ref 70–99)
Potassium: 3.7 mmol/L (ref 3.5–5.1)
Sodium: 137 mmol/L (ref 135–145)
Total Bilirubin: 0.5 mg/dL (ref 0.3–1.2)
Total Protein: 7.8 g/dL (ref 6.5–8.1)

## 2018-07-14 LAB — CBC WITH DIFFERENTIAL/PLATELET
Abs Immature Granulocytes: 0 10*3/uL (ref 0.00–0.07)
Basophils Absolute: 0 10*3/uL (ref 0.0–0.1)
Basophils Relative: 0 %
Eosinophils Absolute: 0 10*3/uL (ref 0.0–0.5)
Eosinophils Relative: 0 %
HCT: 39.9 % (ref 36.0–46.0)
Hemoglobin: 12.5 g/dL (ref 12.0–15.0)
Lymphocytes Relative: 4 %
Lymphs Abs: 1.1 10*3/uL (ref 0.7–4.0)
MCH: 27.1 pg (ref 26.0–34.0)
MCHC: 31.3 g/dL (ref 30.0–36.0)
MCV: 86.4 fL (ref 80.0–100.0)
Monocytes Absolute: 0.8 10*3/uL (ref 0.1–1.0)
Monocytes Relative: 3 %
Neutro Abs: 25.2 10*3/uL — ABNORMAL HIGH (ref 1.7–7.7)
Neutrophils Relative %: 93 %
Platelets: 242 10*3/uL (ref 150–400)
RBC: 4.62 MIL/uL (ref 3.87–5.11)
RDW: 15.6 % — ABNORMAL HIGH (ref 11.5–15.5)
WBC: 27.1 10*3/uL — ABNORMAL HIGH (ref 4.0–10.5)
nRBC: 0 % (ref 0.0–0.2)
nRBC: 0 /100 WBC

## 2018-07-14 LAB — AMMONIA: Ammonia: 33 umol/L (ref 9–35)

## 2018-07-14 LAB — POCT I-STAT EG7
Bicarbonate: 26.3 mmol/L (ref 20.0–28.0)
Calcium, Ion: 1.13 mmol/L — ABNORMAL LOW (ref 1.15–1.40)
HCT: 43 % (ref 36.0–46.0)
Hemoglobin: 14.6 g/dL (ref 12.0–15.0)
O2 Saturation: 99 %
Potassium: 3.9 mmol/L (ref 3.5–5.1)
Sodium: 138 mmol/L (ref 135–145)
TCO2: 28 mmol/L (ref 22–32)
pCO2, Ven: 48.7 mmHg (ref 44.0–60.0)
pH, Ven: 7.341 (ref 7.250–7.430)
pO2, Ven: 169 mmHg — ABNORMAL HIGH (ref 32.0–45.0)

## 2018-07-14 LAB — RAPID URINE DRUG SCREEN, HOSP PERFORMED
Amphetamines: NOT DETECTED
Barbiturates: NOT DETECTED
Benzodiazepines: NOT DETECTED
Cocaine: POSITIVE — AB
Opiates: NOT DETECTED
Tetrahydrocannabinol: POSITIVE — AB

## 2018-07-14 LAB — LACTIC ACID, PLASMA
Lactic Acid, Venous: 2.4 mmol/L (ref 0.5–1.9)
Lactic Acid, Venous: 1.9 mmol/L (ref 0.5–1.9)
Lactic Acid, Venous: 2.1 mmol/L (ref 0.5–1.9)

## 2018-07-14 LAB — URINALYSIS, ROUTINE W REFLEX MICROSCOPIC
Bilirubin Urine: NEGATIVE
Glucose, UA: >=500 mg/dL — AB
Hgb urine dipstick: NEGATIVE
Ketones, ur: NEGATIVE mg/dL
Leukocytes,Ua: NEGATIVE
Nitrite: NEGATIVE
Protein, ur: 30 mg/dL — AB
Specific Gravity, Urine: 1.023 (ref 1.005–1.030)
pH: 6 (ref 5.0–8.0)

## 2018-07-14 LAB — TYPE AND SCREEN
ABO/RH(D): B POS
Antibody Screen: NEGATIVE

## 2018-07-14 LAB — TROPONIN I: Troponin I: <0.03 ng/mL (ref <0.03)

## 2018-07-14 LAB — I-STAT BETA HCG BLOOD, ED (MC, WL, AP ONLY)
I-stat hCG, quantitative: <5 m[IU]/mL (ref <5)
I-stat hCG, quantitative: <5 m[IU]/mL (ref <5)

## 2018-07-14 LAB — OCCULT BLOOD X 1 CARD TO LAB, STOOL: Fecal Occult Bld: NEGATIVE

## 2018-07-14 LAB — OCCULT BLOOD GASTRIC / DUODENUM (SPECIMEN CUP): Occult Blood, Gastric: POSITIVE — AB

## 2018-07-14 LAB — CBG MONITORING, ED: Glucose-Capillary: 281 mg/dL — ABNORMAL HIGH (ref 70–99)

## 2018-07-14 LAB — PROTIME-INR
INR: 1.1 (ref 0.8–1.2)
Prothrombin Time: 14 seconds (ref 11.4–15.2)

## 2018-07-14 LAB — MAGNESIUM: Magnesium: 1.9 mg/dL (ref 1.7–2.4)

## 2018-07-14 LAB — GLUCOSE, CAPILLARY: Glucose-Capillary: 103 mg/dL — ABNORMAL HIGH (ref 70–99)

## 2018-07-14 LAB — HEMOGLOBIN A1C
Hgb A1c MFr Bld: 5.8 % — ABNORMAL HIGH (ref 4.8–5.6)
Mean Plasma Glucose: 119.76 mg/dL

## 2018-07-14 LAB — BRAIN NATRIURETIC PEPTIDE: B Natriuretic Peptide: 80.7 pg/mL (ref 0.0–100.0)

## 2018-07-14 LAB — SARS CORONAVIRUS 2 BY RT PCR (HOSPITAL ORDER, PERFORMED IN ~~LOC~~ HOSPITAL LAB): SARS Coronavirus 2: NEGATIVE

## 2018-07-14 LAB — SALICYLATE LEVEL: Salicylate Lvl: <7 mg/dL (ref 2.8–30.0)

## 2018-07-14 LAB — ACETAMINOPHEN LEVEL: Acetaminophen (Tylenol), Serum: <10 ug/mL — ABNORMAL LOW (ref 10–30)

## 2018-07-14 LAB — ETHANOL: Alcohol, Ethyl (B): <10 mg/dL (ref <10)

## 2018-07-14 MED ORDER — NALOXONE HCL 0.4 MG/ML IJ SOLN
INTRAMUSCULAR | Status: AC
  Administered 2018-07-14: 0.4 mg
  Filled 2018-07-14: qty 1

## 2018-07-14 MED ORDER — SODIUM CHLORIDE 0.9 % IV BOLUS (SEPSIS)
1000.0000 mL | Freq: Once | INTRAVENOUS | Status: AC
  Administered 2018-07-14: 1000 mL via INTRAVENOUS

## 2018-07-14 MED ORDER — SODIUM CHLORIDE 0.9 % IV BOLUS (SEPSIS)
500.0000 mL | Freq: Once | INTRAVENOUS | Status: AC
  Administered 2018-07-14: 500 mL via INTRAVENOUS

## 2018-07-14 MED ORDER — FAMOTIDINE IN NACL 20-0.9 MG/50ML-% IV SOLN
20.0000 mg | Freq: Once | INTRAVENOUS | Status: DC
  Filled 2018-07-14: qty 50

## 2018-07-14 MED ORDER — ACETAMINOPHEN 325 MG PO TABS: 650.0000 mg | ORAL_TABLET | Freq: Four times a day (QID) | ORAL | Status: DC | PRN

## 2018-07-14 MED ORDER — AEROCHAMBER PLUS FLO-VU MISC: 1.0000 | Freq: Once | Status: DC

## 2018-07-14 MED ORDER — SUCRALFATE 1 G PO TABS
1.0000 g | ORAL_TABLET | Freq: Four times a day (QID) | ORAL | Status: DC
  Administered 2018-07-14 – 2018-07-15 (×3): 1 g via ORAL
  Filled 2018-07-14 (×3): qty 1

## 2018-07-14 MED ORDER — LORATADINE 10 MG PO TABS
10.0000 mg | ORAL_TABLET | Freq: Every day | ORAL | Status: DC
  Administered 2018-07-15: 11:00:00 10 mg via ORAL
  Filled 2018-07-14: qty 1

## 2018-07-14 MED ORDER — INSULIN ASPART 100 UNIT/ML ~~LOC~~ SOLN: 0.0000 [IU] | Freq: Three times a day (TID) | SUBCUTANEOUS | Status: DC

## 2018-07-14 MED ORDER — PROCHLORPERAZINE EDISYLATE 10 MG/2ML IJ SOLN
10.0000 mg | Freq: Four times a day (QID) | INTRAMUSCULAR | Status: DC | PRN
  Administered 2018-07-14: 10 mg via INTRAVENOUS
  Filled 2018-07-14: qty 2

## 2018-07-14 MED ORDER — ONDANSETRON HCL 4 MG/2ML IJ SOLN
4.0000 mg | Freq: Four times a day (QID) | INTRAMUSCULAR | Status: DC | PRN
  Administered 2018-07-14: 4 mg via INTRAVENOUS
  Filled 2018-07-14: qty 2

## 2018-07-14 MED ORDER — IOPAMIDOL (ISOVUE-300) INJECTION 61%
100.0000 mL | Freq: Once | INTRAVENOUS | Status: AC | PRN
  Administered 2018-07-14: 100 mL via INTRAVENOUS

## 2018-07-14 MED ORDER — METRONIDAZOLE IN NACL 5-0.79 MG/ML-% IV SOLN
500.0000 mg | Freq: Once | INTRAVENOUS | Status: AC
  Administered 2018-07-14: 15:00:00 500 mg via INTRAVENOUS
  Filled 2018-07-14: qty 100

## 2018-07-14 MED ORDER — ACETAMINOPHEN 650 MG RE SUPP: 650.0000 mg | Freq: Four times a day (QID) | RECTAL | Status: DC | PRN

## 2018-07-14 MED ORDER — ONDANSETRON HCL 4 MG/2ML IJ SOLN
4.0000 mg | Freq: Once | INTRAMUSCULAR | Status: AC
  Administered 2018-07-14: 10:00:00 4 mg via INTRAVENOUS
  Filled 2018-07-14: qty 2

## 2018-07-14 MED ORDER — SODIUM CHLORIDE 0.9 % IV BOLUS
1000.0000 mL | Freq: Once | INTRAVENOUS | Status: AC
  Administered 2018-07-14: 1000 mL via INTRAVENOUS

## 2018-07-14 MED ORDER — ONDANSETRON HCL 4 MG PO TABS
4.0000 mg | ORAL_TABLET | Freq: Four times a day (QID) | ORAL | Status: DC | PRN
  Administered 2018-07-14: 4 mg via ORAL
  Filled 2018-07-14: qty 1

## 2018-07-14 MED ORDER — SODIUM CHLORIDE 0.9 % IV BOLUS
500.0000 mL | Freq: Once | INTRAVENOUS | Status: AC
  Administered 2018-07-14: 500 mL via INTRAVENOUS

## 2018-07-14 MED ORDER — INSULIN ASPART 100 UNIT/ML ~~LOC~~ SOLN: 0.0000 [IU] | Freq: Every day | SUBCUTANEOUS | Status: DC

## 2018-07-14 MED ORDER — METOCLOPRAMIDE HCL 5 MG/ML IJ SOLN
10.0000 mg | Freq: Once | INTRAMUSCULAR | Status: AC
  Administered 2018-07-14: 11:00:00 10 mg via INTRAVENOUS
  Filled 2018-07-14: qty 2

## 2018-07-14 MED ORDER — SODIUM CHLORIDE 0.9 % IV SOLN
INTRAVENOUS | Status: DC
  Administered 2018-07-14 (×3): via INTRAVENOUS

## 2018-07-14 MED ORDER — SODIUM CHLORIDE 0.9 % IV SOLN
2.0000 g | Freq: Once | INTRAVENOUS | Status: AC
  Administered 2018-07-14: 2 g via INTRAVENOUS
  Filled 2018-07-14: qty 2

## 2018-07-14 MED ORDER — AEROCHAMBER PLUS FLO-VU LARGE MISC
1.0000 | Freq: Once | Status: AC
  Administered 2018-07-14: 1

## 2018-07-14 MED ORDER — SODIUM CHLORIDE 0.9 % IV SOLN
80.0000 mg | Freq: Once | INTRAVENOUS | Status: AC
  Administered 2018-07-14: 80 mg via INTRAVENOUS
  Filled 2018-07-14: qty 80

## 2018-07-14 MED ORDER — PANTOPRAZOLE SODIUM 40 MG IV SOLR
40.0000 mg | Freq: Two times a day (BID) | INTRAVENOUS | Status: DC
  Administered 2018-07-14: 40 mg via INTRAVENOUS
  Filled 2018-07-14 (×3): qty 40

## 2018-07-14 MED ORDER — FLUTICASONE PROPIONATE 50 MCG/ACT NA SUSP
1.0000 | Freq: Two times a day (BID) | NASAL | Status: DC | PRN
  Filled 2018-07-14: qty 16

## 2018-07-14 MED ORDER — ALBUTEROL SULFATE HFA 108 (90 BASE) MCG/ACT IN AERS
10.0000 | INHALATION_SPRAY | Freq: Once | RESPIRATORY_TRACT | Status: AC
  Administered 2018-07-14: 10 via RESPIRATORY_TRACT
  Filled 2018-07-14: qty 6.7

## 2018-07-14 MED ORDER — AEROCHAMBER PLUS FLO-VU LARGE MISC
Status: AC
  Filled 2018-07-14: qty 1

## 2018-07-14 MED ORDER — ALBUTEROL SULFATE (2.5 MG/3ML) 0.083% IN NEBU: 2.5000 mg | INHALATION_SOLUTION | Freq: Four times a day (QID) | RESPIRATORY_TRACT | Status: DC | PRN

## 2018-07-14 MED ORDER — VANCOMYCIN HCL IN DEXTROSE 1-5 GM/200ML-% IV SOLN
1000.0000 mg | Freq: Once | INTRAVENOUS | Status: DC
  Filled 2018-07-14: qty 200

## 2018-07-14 NOTE — ED Provider Notes (Signed)
[facility] EMERGENCY DEPARTMENT Provider Note   CSN: [id number] Arrival date & time: 07/14/18  1010    History   Chief Complaint Chief Complaint  Patient presents with   Drug Overdose    HPI Barbara Bruce is a 33 y.o. female brought in by EMS with altered mental status.  History is gathered by EMS and phone call with the patient's husband Mr. Glendale Chard.  He states that he and his 2 sons spent the night in their [place] where they play video games.  They were up late.  The patient was in the house.  He states that when patient seemed to be doing well this morning.  She went and took a bath.  He states when she came out she passed him and said headache and he thought her voice sounded a little bit funny.  He and his sons were sitting in the living room and he is states "something told me I needed to go check on her."  When he went to her room he found her slumped over a trash can not breathing with blue lips.  EMS reports that when they got her her respirations were agonal.  She was given narcan and had some improvement in her respiratory status but still had decreased respiratory drive.  The patient began vomiting bloody vomitus.  She was intermittently complaining of abdominal pain.  Review of EMR shows she has a history of peptic ulcer disease and reflux.  Patient appears to have opiate narcosis with pinpoint pupils and somnolence with decreased respiratory drive.  Her oxygen saturation was 89% after giving Narcan per EMS.  Patient was placed on oxygen.  With improvement.  Patient does admit to taking "a white pill".  After several doses of Narcan patient does admit to purchasing an oxycodone 30 from an acquaintance which she took because she been having severe abdominal pain.  She also admits to vomiting bloody vomitus all night long.  Patient did drink alcohol at 2 AM.  Unsure what amount.  And states that he does not think that she uses any drugs states "if she does she  does not do it around me."     HPI  Past Medical History:  Diagnosis Date   Asthma    GERD (gastroesophageal reflux disease)    Hypertension    Polysubstance abuse (HCC)    repeatedly positive for cocaine, THC   Ulcer     Patient Active Problem List   Diagnosis Date Noted   Drug overdose, accidental or unintentional, initial encounter 07/14/2018    History reviewed. No pertinent surgical history.   OB History   No obstetric history on file.      Home Medications    Prior to Admission medications   Medication Sig Start Date End Date Taking? Authorizing Provider  albuterol (PROVENTIL HFA;VENTOLIN HFA) 108 (90 BASE) MCG/ACT inhaler Inhale 2 puffs into the lungs every 6 (six) hours as needed for wheezing or shortness of breath.    Yes [provider]  albuterol (PROVENTIL) (2.5 MG/3ML) 0.083% nebulizer solution Take 2.5 mg by nebulization at bedtime.   Yes [provider]  ibuprofen (ADVIL) 200 MG tablet Take 600 mg by mouth every 6 (six) hours as needed (for pain).   Yes [provider]  cetirizine (ZYRTEC) 10 MG tablet Take 1 tablet (10 mg total) by mouth daily. Patient not taking: Reported on 07/14/2018 09/28/17   Cuthriell, Delorise Royals, PA-C  famotidine (PEPCID) 20 MG  tablet Take 1 tablet (20 mg total) by mouth 2 (two) times daily. Patient not taking: Reported on 07/14/2018 06/02/14   Lorre Nick, MD  fluticasone [facility]) 50 MCG/ACT nasal spray Place 1 spray into both nostrils 2 (two) times daily. Patient not taking: Reported on 07/14/2018 09/28/17   Cuthriell, Delorise Royals, PA-C  omeprazole (PRILOSEC) 20 MG capsule Take 20 mg by mouth daily.    [provider]  promethazine (PHENERGAN) 25 MG tablet Take 1 tablet (25 mg total) by mouth every 6 (six) hours as needed for nausea or vomiting. Patient not taking: Reported on 07/14/2018 09/15/15   Bethel Born, PA-C    Family History History reviewed. No pertinent family history.  Social  History Social History   Tobacco Use   Smoking status: Current Every Day Smoker    Packs/day: 0.50    Types: Cigarettes   Smokeless tobacco: Never Used  Substance Use Topics   Alcohol use: Yes    Comment: reports 2 beers today, denies daily use, drinks mostly when she uses cocaine   Drug use: Yes    Types: Cocaine, Marijuana    Comment: weekend use of cocaine, daily use of marijuana     Allergies   Patient has no known allergies.   Review of Systems Review of Systems  Ten systems reviewed and are negative for acute change, except as noted in the HPI.   Physical Exam Updated Vital Signs BP (!) 175/93    Pulse 83    Temp 98 F (36.7 C) (Rectal)    Resp 14    Ht 4\' 11"  (1.499 m)    Wt 84.8 kg    SpO2 100%    BMI 37.77 kg/m   Physical Exam Vitals signs and nursing note reviewed.  Constitutional:      General: She is not in acute distress.    Appearance: She is well-developed. She is not diaphoretic.  HENT:     Head: Normocephalic and atraumatic.  Eyes:     General: No scleral icterus.    Conjunctiva/sclera: Conjunctivae normal.     Comments: Meiotic pupils  Neck:     Musculoskeletal: Normal range of motion.  Cardiovascular:     Rate and Rhythm: Normal rate and regular rhythm.     Heart sounds: Normal heart sounds. No murmur. No friction rub. No gallop.   Pulmonary:     Effort: Pulmonary effort is normal. No respiratory distress.     Breath sounds: Normal breath sounds.     Comments: Bradypnea  Abdominal:     General: Bowel sounds are normal. There is no distension.     Palpations: Abdomen is soft. There is no mass.     Tenderness: There is no abdominal tenderness. There is no guarding.  Skin:    General: Skin is warm and dry.  Neurological:     Mental Status: She is alert and oriented to person, place, and time.  Psychiatric:        Behavior: Behavior normal.      ED Treatments / Results  Labs (all labs ordered are listed, but only abnormal results  are displayed) Labs Reviewed  CBC WITH DIFFERENTIAL/PLATELET - Abnormal; Notable for the following components:      Result Value   WBC 27.1 (*)    RDW 15.6 (*)    Neutro Abs 25.2 (*)    All other components within normal limits  COMPREHENSIVE METABOLIC PANEL - Abnormal; Notable for the following components:   Glucose, Bld  275 (*)    All other components within normal limits  LACTIC ACID, PLASMA - Abnormal; Notable for the following components:   Lactic Acid, Venous 2.4 (*)    All other components within normal limits  RAPID URINE DRUG SCREEN, HOSP PERFORMED - Abnormal; Notable for the following components:   Cocaine POSITIVE (*)    Tetrahydrocannabinol POSITIVE (*)    All other components within normal limits  URINALYSIS, ROUTINE W REFLEX MICROSCOPIC - Abnormal; Notable for the following components:   Color, Urine STRAW (*)    Glucose, UA >=500 (*)    Protein, ur 30 (*)    Bacteria, UA RARE (*)    All other components within normal limits  ACETAMINOPHEN LEVEL - Abnormal; Notable for the following components:   Acetaminophen (Tylenol), Serum <10 (*)    All other components within normal limits  CBG MONITORING, ED - Abnormal; Notable for the following components:   Glucose-Capillary 281 (*)    All other components within normal limits  POCT I-STAT EG7 - Abnormal; Notable for the following components:   pO2, Ven 169.0 (*)    Calcium, Ion 1.13 (*)    All other components within normal limits  SARS CORONAVIRUS 2 (HOSPITAL ORDER, PERFORMED IN [facility])  AMMONIA  PROTIME-INR  TROPONIN I  SALICYLATE LEVEL  ETHANOL  BRAIN NATRIURETIC PEPTIDE  MAGNESIUM  OCCULT BLOOD X 1 CARD TO LAB, STOOL  HIV ANTIBODY (ROUTINE TESTING W REFLEX)  HEMOGLOBIN A1C  I-STAT BETA HCG BLOOD, ED ([facility], [facility], [facility] ONLY)  I-STAT BETA HCG BLOOD, ED ([facility], [facility], [facility] ONLY)  I-STAT VENOUS BLOOD GAS, ED  POC OCCULT BLOOD, ED  TYPE AND SCREEN    EKG EKG Interpretation  Date/Time:  Thursday July 14 2018 10:38:19 EDT Ventricular Rate:  91 PR Interval:    QRS Duration: 84 QT Interval:  338 QTC Calculation: 416 R Axis:   47 Text Interpretation:  Sinus rhythm LVH by voltage No significant change since last tracing Confirmed by Alvira MondaySchlossman, Erin ([id number]) on 07/14/2018 10:45:04 AM   Radiology Ct Head Wo Contrast  Result Date: 07/14/2018 CLINICAL DATA:  33 year old female with altered mental status. EXAM: CT HEAD WITHOUT CONTRAST TECHNIQUE: Contiguous axial images were obtained from the base of the skull through the vertex without intravenous contrast. COMPARISON:  None. FINDINGS: Brain: Normal cerebral volume. No midline shift, ventriculomegaly, mass effect, evidence of mass lesion, intracranial hemorrhage or evidence of cortically based acute infarction. Gray-white matter differentiation is within normal limits throughout the brain. Vascular: No suspicious intracranial vascular hyperdensity. Skull: Negative. Sinuses/Orbits: Visualized paranasal sinuses and mastoids are well pneumatized. Mild maxillary alveolar recess mucosal thickening. Other: Negative orbit and scalp soft tissues. IMPRESSION: Normal noncontrast CT appearance of the brain. Electronically Signed   By: Odessa FlemingH  Hall M.D.   On: 07/14/2018 13:53   Ct Abdomen Pelvis W Contrast  Result Date: 07/14/2018 CLINICAL DATA:  Acute, diffuse abdominal pain. EXAM: CT ABDOMEN AND PELVIS WITH CONTRAST TECHNIQUE: Multidetector CT imaging of the abdomen and pelvis was performed using the standard protocol following bolus administration of intravenous contrast. CONTRAST:  100mL ISOVUE-300 IOPAMIDOL (ISOVUE-300) INJECTION 61% COMPARISON:  Abdomen radiograph obtained earlier today. Right upper quadrant abdomen ultrasound dated 06/02/2014. FINDINGS: Lower chest: Minimal bibasilar linear atelectasis or scarring. Hepatobiliary: Probable mild sludge or noncalcified gallstones in the gallbladder. No gallbladder wall thickening or pericholecystic fluid. Normal appearing  liver. Pancreas: Unremarkable. No pancreatic ductal dilatation or surrounding inflammatory changes. Spleen: Normal in size without focal abnormality. Adrenals/Urinary  Tract: Adrenal glands are unremarkable. Kidneys are normal, without renal calculi, focal lesion, or hydronephrosis. Bladder is unremarkable. Stomach/Bowel: Stomach is within normal limits. Appendix appears normal. No evidence of bowel wall thickening, distention, or inflammatory changes. Vascular/Lymphatic: No significant vascular findings are present. No enlarged abdominal or pelvic lymph nodes. Reproductive: Uterus and bilateral adnexa are unremarkable. Other: 2.6 x 1.7 cm oval mass in the inferior right breast. Musculoskeletal: Minimal lumbar and lower thoracic spine degenerative changes. IMPRESSION: 1. No acute abnormality. 2. Probable mild sludge or noncalcified gallstones in the gallbladder. 3. 2.6 x 1.7 cm oval mass in the inferior right breast. Further evaluation with an elective bilateral diagnostic mammogram and right breast ultrasound is recommended. Electronically Signed   By: Beckie Salts M.D.   On: 07/14/2018 13:56   Dg Chest Port 1 View  Result Date: 07/14/2018 CLINICAL DATA:  Hematemesis.  Shortness of breath. EXAM: PORTABLE CHEST 1 VIEW COMPARISON:  Radiographs of April 22, 2017. FINDINGS: The heart size and mediastinal contours are within normal limits. Hypoinflation of the lungs is noted with minimal bibasilar subsegmental atelectasis. No pneumothorax or pleural effusion is noted. The visualized skeletal structures are unremarkable. IMPRESSION: Hypoinflation of the lungs with minimal bibasilar subsegmental atelectasis. Electronically Signed   By: Lupita Raider M.D.   On: 07/14/2018 11:27   Dg Abd Portable 1 View  Result Date: 07/14/2018 CLINICAL DATA:  Hematemesis.  Abdominal pain. EXAM: PORTABLE ABDOMEN - 1 VIEW COMPARISON:  Chest x-ray 07/14/2018.  Ultrasound 06/02/2014. FINDINGS: Soft tissue structures are unremarkable. No  bowel distention or free air. No acute bony abnormality. EKG leads noted over the chest and abdomen. IMPRESSION: No acute abnormality. Electronically Signed   By: Maisie Fus  Register   On: 07/14/2018 13:31    Procedures Procedures (including critical care time)  Medications Ordered in ED Medications  sodium chloride 0.9 % bolus 1,000 mL (0 mLs Intravenous Stopped 07/14/18 1520)    And  sodium chloride 0.9 % bolus 1,000 mL (0 mLs Intravenous Stopped 07/14/18 1622)    And  sodium chloride 0.9 % bolus 500 mL (has no administration in time range)  albuterol (VENTOLIN HFA) 108 (90 Base) MCG/ACT inhaler 10 puff (has no administration in time range)  AeroChamber Plus Flo-Vu Large MISC 1 each (has no administration in time range)  pantoprazole (PROTONIX) injection 40 mg (has no administration in time range)  albuterol (VENTOLIN HFA) 108 (90 Base) MCG/ACT inhaler 2 puff (has no administration in time range)  loratadine (CLARITIN) tablet 10 mg (has no administration in time range)  fluticasone (FLONASE) 50 MCG/ACT nasal spray 1 spray (has no administration in time range)  acetaminophen (TYLENOL) tablet 650 mg (has no administration in time range)    Or  acetaminophen (TYLENOL) suppository 650 mg (has no administration in time range)  ondansetron (ZOFRAN) tablet 4 mg (has no administration in time range)    Or  ondansetron (ZOFRAN) injection 4 mg (has no administration in time range)  insulin aspart (novoLOG) injection 0-15 Units (has no administration in time range)  sucralfate (CARAFATE) tablet 1 g (has no administration in time range)  0.9 %  sodium chloride infusion (has no administration in time range)  insulin aspart (novoLOG) injection 0-5 Units (has no administration in time range)  naloxone (NARCAN) 0.4 MG/ML injection (0.4 mg  Given 07/14/18 1033)  ondansetron (ZOFRAN) injection 4 mg (4 mg Intravenous Given 07/14/18 1020)  sodium chloride 0.9 % bolus 1,000 mL (0 mLs Intravenous Stopped 07/14/18  1230)  pantoprazole (PROTONIX)  80 mg in sodium chloride 0.9 % 100 mL IVPB (0 mg Intravenous Stopped 07/14/18 1230)  metoCLOPramide (REGLAN) injection 10 mg (10 mg Intravenous Given 07/14/18 1106)  ceFEPIme (MAXIPIME) 2 g in sodium chloride 0.9 % 100 mL IVPB (0 g Intravenous Stopped 07/14/18 1520)  metroNIDAZOLE (FLAGYL) IVPB 500 mg (0 mg Intravenous Stopped 07/14/18 1622)  iopamidol (ISOVUE-300) 61 % injection 100 mL (100 mLs Intravenous Contrast Given 07/14/18 1349)     Initial Impression / Assessment and Plan / ED Course  I have reviewed the triage vital signs and the nursing notes.  Pertinent labs & imaging results that were available during my care of the patient were reviewed by me and considered in my medical decision making (see chart for details).  Clinical Course as of Jul 13 1628  Thu Jul 14, 2018  1257 Glucose-Capillary(!): 281 [AH]  1257 WBC(!): 27.1 [AH]  1257 NEUT#(!): 25.2 [AH]  1257 Lactic Acid, Venous(!!): 2.4 [AH]  1257 COCAINE(!): POSITIVE [AH]  1257 Tetrahydrocannabinol(!): POSITIVE [AH]    Clinical Course User Index [AH] Arthor Captain, PA-C       [id number] mental status VS:  Vitals:   07/14/18 1500 07/14/18 1515 07/14/18 1545 07/14/18 1615  BP: (!) 160/115 (!) 175/93    Pulse: 77 (!) 103  83  Resp: Temp:      TempSrc:      SpO2: 97% 96%  100%  Weight:   84.8 kg   Height:    (1.499 m)    WU:[id number] is gathered by the patient's husband, the patient, EMS and review of EMR. DDX:The differential diagnosis for AMS is extensive and includes, but is not limited to: drug overdose - opioids, alcohol, sedatives, antipsychotics, drug withdrawal, others; Metabolic: hypoxia, hypoglycemia, hyperglycemia, hypercalcemia, hypernatremia, hyponatremia, uremia, hepatic encephalopathy, hypothyroidism, hyperthyroidism, vitamin B12 or thiamine deficiency, carbon monoxide poisoning, Wilson's disease, Lactic acidosis; Infectious: meningitis, encephalitis,  bacteremia/sepsis, urinary tract infection, pneumonia, neurosyphilis; Structural: Space-occupying lesion, (brain tumor, subdural hematoma, hydrocephalus,); Vascular: stroke, subarachnoid hemorrhage, coronary ischemia, hypertensive encephalopathy, CNS vasculitis, thrombotic thrombocytopenic purpura, disseminated intravascular coagulation, hyperviscosity; Psychiatric: Schizophrenia, depression; Other: Seizure, hypothermia, heat stroke, ICU psychosis, dementia -"sundowning." Labs: I reviewed the labs which show normal pH, negative occult stool, negative beta hCG, elevated blood glucose, markedly elevated leukocytosis of 27.1.  CMP shows no other abnormalities size elevated blood glucose, no anion gap.  Ammonia is negative, PT/INR is negative, negative troponin lactic acid elevated but trending down with fluid resuscitation. Salicylate, ethanol and BNP negative.  Normal magnesium level.  UDS is positive for cocaine and marijuana.  UA appears contaminated and without signs of infection.  Coronavirus negative. Imaging: I personally reviewed the images (CT head, CT abdomen and pelvis abdominal 1 view, and chest x-ray) which show(s) ovoid mass in the inferior right breast, biliary sludge and gallstones on CT abdomen and pelvis.  There are no other abnormalities noted on chest and abdomen imaging and head CT negative for acute infarct or bleed EKG:   EKG Interpretation  Date/Time:  Thursday July 14 2018 10:38:19 EDT Ventricular Rate:  91 PR Interval:    QRS Duration: 84 QT Interval:  338 QTC Calculation: 416 R Axis:   47 Text Interpretation:  Sinus rhythm LVH by voltage No significant change since last tracing Confirmed by Alvira Monday ([id number]) on 07/14/2018 10:45:04 AM       MDM: 33 year old female with acute opioid overdose. Associated epigastric abdominal pain, hematemesis.  Patient may have Mallory-Weiss tear versus bleeding  gastric ulcer given her history.  Nausea and vomiting may also be secondary  to symptomatic gallstone or biliary colic.  Also appears to be septic however I do feel that this is more likely to significant dehydration and systemic inflammatory reaction given her hypoxia and vomiting.  Mental status has improved significantly after Narcan.  She is being treated with broad-spectrum antibiotics and fluids.  I have discussed the patient with Dr. Ophelia Charter who will admit the patient.   Patient disposition: Admit Patient condition: Stable. The patient appears reasonably stabilized for admission considering the current resources, flow, and capabilities available in the ED at this time, and I doubt any other [facility] requiring further screening and/or treatment in the ED prior to admission.   Final Clinical Impressions(s) / ED Diagnoses   Final diagnoses:  Drug overdose, undetermined intent, initial encounter  Polysubstance abuse (HCC)  Hemoptysis  Wheezing  Calculus of gallbladder without cholecystitis without obstruction  Sepsis, due to unspecified organism, unspecified whether acute organ dysfunction present [facility])  Breast mass    ED Discharge Orders    None       Arthor Captain, PA-C 07/14/18 1630    Alvira Monday, MD 07/17/18 [phone]

## 2018-07-14 NOTE — ED Triage Notes (Signed)
PT O2  sats 87 % on room air . Nasal O2 at 2lit with O2 sats increased to 97.

## 2018-07-14 NOTE — Plan of Care (Signed)
  Problem: Education: Goal: Knowledge of General Education information will improve Description: Including pain rating scale, medication(s)/side effects and non-pharmacologic comfort measures Outcome: Progressing   Problem: Pain Managment: Goal: General experience of comfort will improve Outcome: Progressing   Problem: Safety: Goal: Ability to remain free from injury will improve Outcome: Progressing   

## 2018-07-14 NOTE — ED Notes (Signed)
Pt following commands, alert to voice.

## 2018-07-14 NOTE — ED Notes (Addendum)
Pt states that she took a pill that she bought "from this lady. I bought it a while ago because my feet hurt. She didn't say what it was but called it a narcotic and said I wouldn't feel nothing. It might have been oxy 30s". Pt states that she has taken pills from the same woman before and has not had this reaction before but states that this is the first time that she has taken the pill with alcohol before".

## 2018-07-14 NOTE — ED Notes (Addendum)
Pt now looking around, still lethargic but alert. Pt gaging. Emesis is dark in color. Denies trying to harm herself. Endorses abdominal pain. Pt states that she drank maybe two beers this morning.

## 2018-07-14 NOTE — ED Triage Notes (Signed)
Per [facility]: Pt from home, found unresponsive. Agonal respirations with pinpoint pupils, 2 mg of narcan IN. Threw up about 10 minutes after narcan given. No hx of substance abuse. Pt under stress, etoh in room. Admitted to taking a white pill this morning while drinking etoh. Family found powedr in the bathroom. Pt complaining of abd pain, dark colored emesis present in ED. Pt has hx of asthma.

## 2018-07-14 NOTE — ED Notes (Signed)
Phlebotomy at bedside.

## 2018-07-14 NOTE — ED Notes (Addendum)
Pt able to roll back and forth on the bed with minimal assistance. Pt still sleepy but is more alert than when she arrived. Pt states that she has had black colored emesis since yesterday and has also had black colored stool since yesterday as well. States that neither of these are normal for her.

## 2018-07-14 NOTE — Progress Notes (Signed)
Lactic Acid up to 2.1, NS bolus per MD order.  Patient states that she is feeling much better.

## 2018-07-14 NOTE — H&P (Signed)
History and Physical    Barbara Bruce ZOX:[id number] DOB: 25-Jul-1985 DOA: 07/14/2018  PCP: Patient, No Pcp Per Consultants:  None Patient coming from:  Home - lives with husband and son (33yo); NOK: Husband and mother, [phone]  Chief Complaint: Drug overdose  HPI: Barbara Bruce is a 33 y.o. female with medical history significant of asthma; polysubstance abuse; and GERD presenting with drug overdose.  She has chronic foot pain and she had back pain.  She was hurting and she went in her purse and got a pin pill that she got from her sister.  She knows it was a narcotic but not sure what kind.  She went in her room and her husband and found her unresponsive.  She took the pain pill this morning and her husband found her maybe 30-40 minutes later.  She has been vomiting all night and all day, "black stuff",  She was nauseated with emesis before she took the pain pill.  She reports last used cocaine maybe last Thursday (a week ago).     ED Course:   Vomiting blood on arrival with ?aspiration.  Husband unaware of drug use.  Likely overdosed on oxycodone.  Probably also using cocaine.  Opiate narcosis on arrival, improved with Narcan.  She was in a stupor but waking up with epigastric palpation.  Vomited gross blood all night.  Has h/o PUD.  ?Mallory-Weiss.  ?sepsis but likely related to dehydration and physiologic stress.    Review of Systems: As per HPI; otherwise review of systems reviewed and negative.   Ambulatory Status:  Ambulates without assistance  Past Medical History:  Diagnosis Date  . Asthma   . GERD (gastroesophageal reflux disease)   . Hypertension   . Polysubstance abuse (HCC)    repeatedly positive for cocaine, THC  . Ulcer     History reviewed. No pertinent surgical history.  Social History   Socioeconomic History  . Marital status: Married    Spouse name: Not on file  . Number of children: Not on file  . Years of education: Not on file  . Highest education  level: Not on file  Occupational History  . 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 Financial resource strain: Not on file  . Food insecurity:    Worry: Not on file    Inability: Not on file  . Transportation needs:    Medical: Not on file    Non-medical: Not on file  Tobacco Use  . Smoking status: Current Every Day Smoker    Packs/day: 0.50    Types: Cigarettes  . Smokeless tobacco: Never Used  Substance and Sexual Activity  . Alcohol use: Yes    Comment: reports 2 beers today, denies daily use, drinks mostly when she uses cocaine  . Drug use: Yes    Types: Cocaine, Marijuana    Comment: weekend use of cocaine, daily use of marijuana  . Sexual activity: Not on file  Lifestyle  . Physical activity:    Days per week: Not on file    Minutes per session: Not on file  . Stress: Not on file  Relationships  . Social connections:    Talks on phone: Not on file    Gets together: Not on file    Attends religious service: Not on file    Active member of club or organization: Not on file    Attends meetings of clubs or organizations: Not on file  Relationship status: Not on file  . Intimate partner violence:    Fear of current or ex partner: Not on file    Emotionally abused: Not on file    Physically abused: Not on file    Forced sexual activity: Not on file  Other Topics Concern  . Not on file  Social History Narrative  . Not on file    No Known Allergies  History reviewed. No pertinent family history.  Prior to Admission medications   Medication Sig Start Date End Date Taking? Authorizing Provider  albuterol (PROVENTIL HFA;VENTOLIN HFA) 108 (90 BASE) MCG/ACT inhaler Inhale 2 puffs into the lungs every 6 (six) hours as needed for wheezing (wheezing).     [provider]  amoxicillin-clavulanate (AUGMENTIN) 875-125 MG tablet Take 1 tablet by mouth 2 (two) times daily. 09/28/17   Cuthriell, Delorise Royals, PA-C  azithromycin  (ZITHROMAX) 250 MG tablet Take 1 tablet (250 mg total) by mouth daily. Take first 2 tablets together, then 1 every day until finished. 04/22/17   Hedges, Tinnie Gens, PA-C  cetirizine (ZYRTEC) 10 MG tablet Take 1 tablet (10 mg total) by mouth daily. 09/28/17   Cuthriell, Delorise Royals, PA-C  famotidine (PEPCID) 20 MG tablet Take 1 tablet (20 mg total) by mouth 2 (two) times daily. 06/02/14   Lorre Nick, MD  fluticasone (FLONASE) 50 MCG/ACT nasal spray Place 1 spray into both nostrils 2 (two) times daily. 09/28/17   Cuthriell, Delorise Royals, PA-C  HYDROcodone-acetaminophen (NORCO/VICODIN) 5-325 MG tablet Take 2 tablets by mouth every 4 (four) hours as needed. 09/15/15   Bethel Born, PA-C  meloxicam (MOBIC) 15 MG tablet Take 1 tablet (15 mg total) by mouth daily. 05/19/17   Vivi Barrack, DPM  metroNIDAZOLE (FLAGYL) 500 MG tablet Take 1 tablet (500 mg total) by mouth 2 (two) times daily. 09/15/15   Bethel Born, PA-C  naproxen sodium (ALEVE) 220 MG tablet Take 440 mg by mouth daily as needed (pain).     [provider]  omeprazole (PRILOSEC) 20 MG capsule Take 20 mg by mouth daily.    [provider]  oseltamivir (TAMIFLU) 75 MG capsule Take 1 capsule (75 mg total) by mouth every 12 (twelve) hours. 04/22/17   Hedges, Tinnie Gens, PA-C  promethazine (PHENERGAN) 25 MG tablet Take 1 tablet (25 mg total) by mouth every 6 (six) hours as needed for nausea or vomiting. 09/15/15   Bethel Born, PA-C    Physical Exam: Vitals:   07/14/18 1500 07/14/18 1515 07/14/18 1545 07/14/18 1615  BP: (!) 160/115 (!) 175/93    Pulse: 77 (!) 103  83  Resp: 10 17  14   Temp:      TempSrc:      SpO2: 97% 96%  100%  Weight:   84.8 kg   Height:   4\' 11"  (1.499 m)      . General:  Appears disheveled and mildly nauseated but is NAD; she did "vomit" while I was in the room and it appeared to be clear saliva . Eyes:  PERRL, EOMI, normal lids, iris . ENT:  grossly normal hearing, lips & tongue, mmm;  appropriate dentition . Neck:  no LAD, masses or thyromegaly; no carotid bruits . Cardiovascular:  RRR, no m/r/g. No LE edema.  Marland Kitchen Respiratory:   CTA bilaterally with no wheezes/rales/rhonchi.  Normal respiratory effort. . Abdomen:  soft, mildly TTP in midepigastric/LUQ regions, ND, NABS . Back:   normal alignment, no CVAT . Skin:  no rash or  induration seen on limited exam . Musculoskeletal:  grossly normal tone BUE/BLE, good ROM, no bony abnormality . Psychiatric:  grossly normal mood and affect, speech fluent and appropriate, AOx3, possible mild cognitive slowing (suspect chronic) . Neurologic:  CN 2-12 grossly intact, moves all extremities in coordinated fashion, sensation intact    Radiological Exams on Admission: Ct Head Wo Contrast  Result Date: 07/14/2018 CLINICAL DATA:  33 year old female with altered mental status. EXAM: CT HEAD WITHOUT CONTRAST TECHNIQUE: Contiguous axial images were obtained from the base of the skull through the vertex without intravenous contrast. COMPARISON:  None. FINDINGS: Brain: Normal cerebral volume. No midline shift, ventriculomegaly, mass effect, evidence of mass lesion, intracranial hemorrhage or evidence of cortically based acute infarction. Gray-white matter differentiation is within normal limits throughout the brain. Vascular: No suspicious intracranial vascular hyperdensity. Skull: Negative. Sinuses/Orbits: Visualized paranasal sinuses and mastoids are well pneumatized. Mild maxillary alveolar recess mucosal thickening. Other: Negative orbit and scalp soft tissues. IMPRESSION: Normal noncontrast CT appearance of the brain. Electronically Signed   By: Odessa Fleming M.D.   On: 07/14/2018 13:53   Ct Abdomen Pelvis W Contrast  Result Date: 07/14/2018 CLINICAL DATA:  Acute, diffuse abdominal pain. EXAM: CT ABDOMEN AND PELVIS WITH CONTRAST TECHNIQUE: Multidetector CT imaging of the abdomen and pelvis was performed using the standard protocol following bolus  administration of intravenous contrast. CONTRAST:  ISOVUE-300 IOPAMIDOL (ISOVUE-300) INJECTION 61% COMPARISON:  Abdomen radiograph obtained earlier today. Right upper quadrant abdomen ultrasound dated 06/02/2014. FINDINGS: Lower chest: Minimal bibasilar linear atelectasis or scarring. Hepatobiliary: Probable mild sludge or noncalcified gallstones in the gallbladder. No gallbladder wall thickening or pericholecystic fluid. Normal appearing liver. Pancreas: Unremarkable. No pancreatic ductal dilatation or surrounding inflammatory changes. Spleen: Normal in size without focal abnormality. Adrenals/Urinary Tract: Adrenal glands are unremarkable. Kidneys are normal, without renal calculi, focal lesion, or hydronephrosis. Bladder is unremarkable. Stomach/Bowel: Stomach is within normal limits. Appendix appears normal. No evidence of bowel wall thickening, distention, or inflammatory changes. Vascular/Lymphatic: No significant vascular findings are present. No enlarged abdominal or pelvic lymph nodes. Reproductive: Uterus and bilateral adnexa are unremarkable. Other: 2.6 x 1.7 cm oval mass in the inferior right breast. Musculoskeletal: Minimal lumbar and lower thoracic spine degenerative changes. IMPRESSION: 1. No acute abnormality. 2. Probable mild sludge or noncalcified gallstones in the gallbladder. 3. 2.6 x 1.7 cm oval mass in the inferior right breast. Further evaluation with an elective bilateral diagnostic mammogram and right breast ultrasound is recommended. Electronically Signed   By: Beckie Salts M.D.   On: 07/14/2018 13:56   Dg Chest Port 1 View  Result Date: 07/14/2018 CLINICAL DATA:  Hematemesis.  Shortness of breath. EXAM: PORTABLE CHEST 1 VIEW COMPARISON:  Radiographs of April 22, 2017. FINDINGS: The heart size and mediastinal contours are within normal limits. Hypoinflation of the lungs is noted with minimal bibasilar subsegmental atelectasis. No pneumothorax or pleural effusion is noted. The  visualized skeletal structures are unremarkable. IMPRESSION: Hypoinflation of the lungs with minimal bibasilar subsegmental atelectasis. Electronically Signed   By: Lupita Raider M.D.   On: 07/14/2018 11:27   Dg Abd Portable 1 View  Result Date: 07/14/2018 CLINICAL DATA:  Hematemesis.  Abdominal pain. EXAM: PORTABLE ABDOMEN - 1 VIEW COMPARISON:  Chest x-ray 07/14/2018.  Ultrasound 06/02/2014. FINDINGS: Soft tissue structures are unremarkable. No bowel distention or free air. No acute bony abnormality. EKG leads noted over the chest and abdomen. IMPRESSION: No acute abnormality. Electronically Signed   By: Maisie Fus  Register   On:  07/14/2018 13:31    EKG: Independently reviewed.  NSR with rate 91 nonspecific ST changes with no evidence of acute ischemia   Labs on Admission: I have personally reviewed the available labs and imaging studies at the time of the admission.  Pertinent labs:   Glucose 275 CMP otherwise WNL BNP 80.7 Troponin <0.03 Lactate 2.4 WBC 27.1 INR 1.1 APAP <10 ASA <7 UA: >500 glucose, 30 protein UDS + cocaine and THC but not opiates Heme negative HCG negative VBG: 7.341/48.7/169   Assessment/Plan Principal Problem:   Drug overdose, accidental or unintentional, initial encounter Active Problems:   Hematemesis   Elevated lactic acid level   Breast mass in female   Hyperglycemia   Essential hypertension   Polysubstance abuse (HCC)    Drug overdose, polysubstance abuse -Patient with long-standing h/o cocaine and marijuana abuse dating back to 2008 and present on multiple drug screens since -Reports after being found unresponsive -Improved with Narcan -Now alert and able to answer questions appropriately -Acknowledges "weekend" use of cocaine, ETOH; daily use of marijuana.   -Reports use this AM of a "pain pill" that was a "narcotic"; UDS is negative for opiates -Tylenol negative -Salicylate negative -UDS positive for cocaine and THC -Will do overnight  observation  -Consider psych consult as inpatient or outpatient -SW consult requested  Hematemesis -Patient without frank hematemesis at the time of my evaluation -Normal Hgb -Heme negative -Given Protonix 80 mg IV in the ER; will continue with 40 mg IV BID overnight -Will give standing PO Carafate q6h -NPO for now -Recheck CBC in AM - sooner if bleeding recurs -Anticipate d/c to home tomorrow; can have outpatient GI consult unless new issues arise  Elevated lactate -Unlikely sepsis at this time, despite elevated WBC count -Instead, more likely related to drug overdose -Will hold additional antibiotics at this time -Aspiration is a consideration but currently without apparent infection -Will trend lactate  Breast mass, incidental finding -Needs outpatient evaluation with mammogram  Hyperglycemia -Strongly suspect DM -Will check A1c -Will cover with moderate-scale SSI  HTN -Patient reports h/o but does not appear to be taking medication for this issue at this time,   Note: This patient will be tested for the novel coronavirus COVID-19 in accordance with current hospital policy.  DVT prophylaxis:  SCDs Code Status:  Full - confirmed with patient/family Family Communication: None present  Disposition Plan:  Home once clinically improved Consults called: None  Admission status: It is my clinical opinion that referral for OBSERVATION is reasonable and necessary in this patient based on the above information provided. The aforementioned taken together are felt to place the patient at high risk for further clinical deterioration. However it is anticipated that the patient may be medically stable for discharge from the hospital within 24 to 48 hours.    Jonah BlueJennifer Carleta Woodrow MD [facility]   How to contact the [facility] Attending or Consulting provider 7A - 7P or covering provider during after hours 7P -7A, for this patient?  1. Check the care team in [facility] and look for a)  attending/consulting [facility] provider listed and b) the [facility] team listed 2. Log into [email or web address] and use 's universal password to access. If you do not have the password, please contact the hospital operator. 3. Locate the [facility] provider you are looking for under [facility] and page to a number that you can be directly reached. 4. If you still have difficulty reaching the provider, please page the [facility] (Director on Call) for  the Hospitalists listed on amion for assistance.   07/14/2018, 4:39 PM

## 2018-07-15 ENCOUNTER — Encounter (HOSPITAL_COMMUNITY): Payer: Self-pay

## 2018-07-15 ENCOUNTER — Other Ambulatory Visit: Payer: Self-pay

## 2018-07-15 DIAGNOSIS — T50901A Poisoning by unspecified drugs, medicaments and biological substances, accidental (unintentional), initial encounter: Secondary | ICD-10-CM | POA: Diagnosis not present

## 2018-07-15 LAB — BASIC METABOLIC PANEL
Anion gap: 10 (ref 5–15)
BUN: <5 mg/dL — ABNORMAL LOW (ref 6–20)
CO2: 24 mmol/L (ref 22–32)
Calcium: 8.3 mg/dL — ABNORMAL LOW (ref 8.9–10.3)
Chloride: 105 mmol/L (ref 98–111)
Creatinine, Ser: 0.59 mg/dL (ref 0.44–1.00)
GFR calc Af Amer: >60 mL/min (ref >60)
GFR calc non Af Amer: >60 mL/min (ref >60)
Glucose, Bld: 113 mg/dL — ABNORMAL HIGH (ref 70–99)
Potassium: 3.3 mmol/L — ABNORMAL LOW (ref 3.5–5.1)
Sodium: 139 mmol/L (ref 135–145)

## 2018-07-15 LAB — CBC
HCT: 34.6 % — ABNORMAL LOW (ref 36.0–46.0)
Hemoglobin: 10.6 g/dL — ABNORMAL LOW (ref 12.0–15.0)
MCH: 26.6 pg (ref 26.0–34.0)
MCHC: 30.6 g/dL (ref 30.0–36.0)
MCV: 86.7 fL (ref 80.0–100.0)
Platelets: 227 10*3/uL (ref 150–400)
RBC: 3.99 MIL/uL (ref 3.87–5.11)
RDW: 15.8 % — ABNORMAL HIGH (ref 11.5–15.5)
WBC: 19.3 10*3/uL — ABNORMAL HIGH (ref 4.0–10.5)
nRBC: 0 % (ref 0.0–0.2)

## 2018-07-15 LAB — HIV ANTIBODY (ROUTINE TESTING W REFLEX): HIV Screen 4th Generation wRfx: NONREACTIVE

## 2018-07-15 LAB — GLUCOSE, CAPILLARY
Glucose-Capillary: 103 mg/dL — ABNORMAL HIGH (ref 70–99)
Glucose-Capillary: 99 mg/dL (ref 70–99)

## 2018-07-15 LAB — ABO/RH: ABO/RH(D): B POS

## 2018-07-15 MED ORDER — OMEPRAZOLE 20 MG PO CPDR: 20.0000 mg | DELAYED_RELEASE_CAPSULE | Freq: Every day | ORAL | 0 refills | Status: DC

## 2018-07-15 MED ORDER — ALBUTEROL SULFATE HFA 108 (90 BASE) MCG/ACT IN AERS: 2.0000 | INHALATION_SPRAY | Freq: Four times a day (QID) | RESPIRATORY_TRACT | 0 refills | Status: DC | PRN

## 2018-07-15 NOTE — Plan of Care (Signed)
Problem: Education: Goal: Knowledge of General Education information will improve Description Including pain rating scale, medication(s)/side effects and non-pharmacologic comfort measures Outcome: Progressing   Problem: Health Behavior/Discharge Planning: Goal: Ability to manage health-related needs will improve Outcome: Progressing   Problem: Clinical Measurements: Goal: Ability to maintain clinical measurements within normal limits will improve Outcome: Progressing Goal: Cardiovascular complication will be avoided Outcome: Progressing   Problem: Activity: Goal: Risk for activity intolerance will decrease Outcome: Progressing   Problem: Coping: Goal: Level of anxiety will decrease Outcome: Progressing   Problem: Pain Managment: Goal: General experience of comfort will improve Outcome: Progressing   Problem: Safety: Goal: Ability to remain free from injury will improve Outcome: Progressing   Problem: Skin Integrity: Goal: Risk for impaired skin integrity will decrease Outcome: Progressing   

## 2018-07-15 NOTE — Discharge Summary (Signed)
Physician Discharge Summary  KA BENCH UJW:[id number] DOB: 1985/06/15 DOA: 07/14/2018  PCP: Patient, No Pcp Per  Admit date: 07/14/2018 Discharge date: 07/15/2018  Admitted From: home Discharge disposition: home   Recommendations for Outpatient Follow-Up:   Cbc 1 week  outpatient evaluation with mammogram  Discharge Diagnosis:   Principal Problem:   Drug overdose, accidental or unintentional, initial encounter Active Problems:   Hematemesis   Elevated lactic acid level   Breast mass in female   Hyperglycemia   Essential hypertension   Polysubstance abuse (HCC)    Discharge Condition: Improved.  Diet recommendation: Regular.  Wound care: None.  Code status: Full.   History of Present Illness:   Barbara Bruce is a 33 y.o. female with medical history significant of asthma; polysubstance abuse; and GERD presenting with drug overdose.  She has chronic foot pain and she had back pain.  She was hurting and she went in her purse and got a pin pill that she got from her sister.  She knows it was a narcotic but not sure what kind.  She went in her room and her husband and found her unresponsive.  She took the pain pill this morning and her husband found her maybe 30-40 minutes later.  She has been vomiting all night and all day, "black stuff",  She was nauseated with emesis before she took the pain pill.  She reports last used cocaine maybe last Thursday (a week ago).      Hospital Course by Problem:   Drug overdose, polysubstance abuse -Patient with long-standing h/o cocaine and marijuana abuse dating back to 2008 and present on multiple drug screens since -Reports after being found unresponsive -Improved with Narcan -Reports use this AM of a "pain pill" that was a "narcotic"; UDS is negative for opiates-- took this medication for "Foot pain": sounds like plantar fascitis-- worse in AM when getting up -given exercises and information about treating plantar  fascitis  Hematemesis -Patient without frank hematemesis at the time of my evaluation -Hgb close to baseline -Heme negative -can follow up outpatient with GI if needed  Breast mass, incidental finding -Needs outpatient evaluation with mammogram  Hyperglycemia -suspect reactive -HgbA1c: <6  Hypokalemia -started diet -outpatient follow up   Medical Consultants:      Discharge Exam:   Vitals:   07/14/18 2014 07/15/18 0415  BP: (!) 155/70 115/72  Pulse: 91 65  Resp: 14 14  Temp: 98.3 F (36.8 C) 98.8 F (37.1 C)  SpO2: 100% 97%   Vitals:   07/14/18 1817 07/14/18 1900 07/14/18 2014 07/15/18 0415  BP:  125/74 (!) 155/70 115/72  Pulse:  71 91 65  Resp:  (!) Temp:  98.4 F (36.9 C) 98.3 F (36.8 C) 98.8 F (37.1 C)  TempSrc:  Oral Oral Oral  SpO2: 97% 94% 100% 97%  Weight:      Height:         The results of significant diagnostics from this hospitalization (including imaging, microbiology, ancillary and laboratory) are listed below for reference.     Procedures and Diagnostic Studies:   Ct Head Wo Contrast  Result Date: 07/14/2018 CLINICAL DATA:  33 year old female with altered mental status. EXAM: CT HEAD WITHOUT CONTRAST TECHNIQUE: Contiguous axial images were obtained from the base of the skull through the vertex without intravenous contrast. COMPARISON:  None. FINDINGS: Brain: Normal cerebral volume. No midline shift, ventriculomegaly, mass effect, evidence of mass lesion, intracranial hemorrhage  or evidence of cortically based acute infarction. Gray-white matter differentiation is within normal limits throughout the brain. Vascular: No suspicious intracranial vascular hyperdensity. Skull: Negative. Sinuses/Orbits: Visualized paranasal sinuses and mastoids are well pneumatized. Mild maxillary alveolar recess mucosal thickening. Other: Negative orbit and scalp soft tissues. IMPRESSION: Normal noncontrast CT appearance of the brain. Electronically  Signed   By: Odessa Fleming M.D.   On: 07/14/2018 13:53   Ct Abdomen Pelvis W Contrast  Result Date: 07/14/2018 CLINICAL DATA:  Acute, diffuse abdominal pain. EXAM: CT ABDOMEN AND PELVIS WITH CONTRAST TECHNIQUE: Multidetector CT imaging of the abdomen and pelvis was performed using the standard protocol following bolus administration of intravenous contrast. CONTRAST:  ISOVUE-300 IOPAMIDOL (ISOVUE-300) INJECTION 61% COMPARISON:  Abdomen radiograph obtained earlier today. Right upper quadrant abdomen ultrasound dated 06/02/2014. FINDINGS: Lower chest: Minimal bibasilar linear atelectasis or scarring. Hepatobiliary: Probable mild sludge or noncalcified gallstones in the gallbladder. No gallbladder wall thickening or pericholecystic fluid. Normal appearing liver. Pancreas: Unremarkable. No pancreatic ductal dilatation or surrounding inflammatory changes. Spleen: Normal in size without focal abnormality. Adrenals/Urinary Tract: Adrenal glands are unremarkable. Kidneys are normal, without renal calculi, focal lesion, or hydronephrosis. Bladder is unremarkable. Stomach/Bowel: Stomach is within normal limits. Appendix appears normal. No evidence of bowel wall thickening, distention, or inflammatory changes. Vascular/Lymphatic: No significant vascular findings are present. No enlarged abdominal or pelvic lymph nodes. Reproductive: Uterus and bilateral adnexa are unremarkable. Other: 2.6 x 1.7 cm oval mass in the inferior right breast. Musculoskeletal: Minimal lumbar and lower thoracic spine degenerative changes. IMPRESSION: 1. No acute abnormality. 2. Probable mild sludge or noncalcified gallstones in the gallbladder. 3. 2.6 x 1.7 cm oval mass in the inferior right breast. Further evaluation with an elective bilateral diagnostic mammogram and right breast ultrasound is recommended. Electronically Signed   By: Beckie Salts M.D.   On: 07/14/2018 13:56   Dg Chest Port 1 View  Result Date: 07/14/2018 CLINICAL DATA:   Hematemesis.  Shortness of breath. EXAM: PORTABLE CHEST 1 VIEW COMPARISON:  Radiographs of April 22, 2017. FINDINGS: The heart size and mediastinal contours are within normal limits. Hypoinflation of the lungs is noted with minimal bibasilar subsegmental atelectasis. No pneumothorax or pleural effusion is noted. The visualized skeletal structures are unremarkable. IMPRESSION: Hypoinflation of the lungs with minimal bibasilar subsegmental atelectasis. Electronically Signed   By: Lupita Raider M.D.   On: 07/14/2018 11:27   Dg Abd Portable 1 View  Result Date: 07/14/2018 CLINICAL DATA:  Hematemesis.  Abdominal pain. EXAM: PORTABLE ABDOMEN - 1 VIEW COMPARISON:  Chest x-ray 07/14/2018.  Ultrasound 06/02/2014. FINDINGS: Soft tissue structures are unremarkable. No bowel distention or free air. No acute bony abnormality. EKG leads noted over the chest and abdomen. IMPRESSION: No acute abnormality. Electronically Signed   By: Maisie Fus  Register   On: 07/14/2018 13:31     Labs:   Basic Metabolic Panel: Recent Labs  Lab 07/14/18 1048 07/14/18 1122 07/15/18 0411  NA 137 138 139  K 3.7 3.9 3.3*  CL 101  --  105  CO2 23  --  24  GLUCOSE 275*  --  113*  BUN 9  --  <5*  CREATININE 0.71  --  0.59  CALCIUM 9.2  --  8.3*  MG 1.9  --   --    GFR Estimated Creatinine Clearance: 95.3 mL/min (by C-G formula based on SCr of 0.59 mg/dL). Liver Function Tests: Recent Labs  Lab 07/14/18 1048  AST 21  ALT 20  ALKPHOS 60  BILITOT 0.5  PROT 7.8  ALBUMIN 4.3   No results for input(s): LIPASE, AMYLASE in the last 168 hours. Recent Labs  Lab 07/14/18 1048  AMMONIA 33   Coagulation profile Recent Labs  Lab 07/14/18 1048  INR 1.1    CBC: Recent Labs  Lab 07/14/18 1048 07/14/18 1122 07/15/18 0411  WBC 27.1*  --  19.3*  NEUTROABS 25.2*  --   --   HGB 12.5 14.6 10.6*  HCT 39.9 43.0 34.6*  MCV 86.4  --  86.7  PLT 242  --  227   Cardiac Enzymes: Recent Labs  Lab 07/14/18 1048  TROPONINI  <0.03   BNP: Invalid input(s): POCBNP CBG: Recent Labs  Lab 07/14/18 1052 07/14/18 2136 07/15/18 0758  GLUCAP 281* 103* 103*   D-Dimer No results for input(s): DDIMER in the last 72 hours. Hgb A1c Recent Labs    07/14/18 1800  HGBA1C 5.8*   Lipid Profile No results for input(s): CHOL, HDL, LDLCALC, TRIG, CHOLHDL, LDLDIRECT in the last 72 hours. Thyroid function studies No results for input(s): TSH, T4TOTAL, T3FREE, THYROIDAB in the last 72 hours.  Invalid input(s): FREET3 Anemia work up No results for input(s): VITAMINB12, FOLATE, FERRITIN, TIBC, IRON, RETICCTPCT in the last 72 hours. Microbiology Recent Results (from the past 240 hour(s))  SARS Coronavirus 2 (CEPHEID - Performed in [facility] hospital lab), Hosp Order     Status: None   Collection Time: 07/14/18 10:28 AM  Result Value Ref Range Status   SARS Coronavirus 2 NEGATIVE NEGATIVE Final    Comment: (NOTE) If result is NEGATIVE SARS-CoV-2 target nucleic acids are NOT DETECTED. The SARS-CoV-2 RNA is generally detectable in upper and lower  respiratory specimens during the acute phase of infection. The lowest  concentration of SARS-CoV-2 viral copies this assay can detect is 250  copies / mL. A negative result does not preclude SARS-CoV-2 infection  and should not be used as the sole basis for treatment or other  patient management decisions.  A negative result may occur with  improper specimen collection / handling, submission of specimen other  than nasopharyngeal swab, presence of viral mutation(s) within the  areas targeted by this assay, and inadequate number of viral copies  (<250 copies / mL). A negative result must be combined with clinical  observations, patient history, and epidemiological information. If result is POSITIVE SARS-CoV-2 target nucleic acids are DETECTED. The SARS-CoV-2 RNA is generally detectable in upper and lower  respiratory specimens dur ing the acute phase of infection.   Positive  results are indicative of active infection with SARS-CoV-2.  Clinical  correlation with patient history and other diagnostic information is  necessary to determine patient infection status.  Positive results do  not rule out bacterial infection or co-infection with other viruses. If result is PRESUMPTIVE POSTIVE SARS-CoV-2 nucleic acids MAY BE PRESENT.   A presumptive positive result was obtained on the submitted specimen  and confirmed on repeat testing.  While 2019 novel coronavirus  (SARS-CoV-2) nucleic acids may be present in the submitted sample  additional confirmatory testing may be necessary for epidemiological  and / or clinical management purposes  to differentiate between  SARS-CoV-2 and other Sarbecovirus currently known to infect humans.  If clinically indicated additional testing with an alternate test  methodology [phone]([id number]) is advised. The SARS-CoV-2 RNA is generally  detectable in upper and lower respiratory sp ecimens during the acute  phase of infection. The expected result is Negative. Fact Sheet for Patients:  BoilerBrush.com.cyhttps://www.fda.gov/media/136312/download  Fact Sheet for Healthcare Providers: https://pope.com/ This test is not yet approved or cleared by the [place] FDA and has been authorized for detection and/or diagnosis of SARS-CoV-2 by FDA under an Emergency Use Authorization (EUA).  This EUA will remain in effect (meaning this test can be used) for the duration of the COVID-19 declaration under Section 564(b)(1) of the Act, 21 U.S.C. section 360bbb-3(b)(1), unless the authorization is terminated or revoked sooner. Performed at [facility], [address] [place], [place] [address]      Discharge Instructions:   Discharge Instructions    Diet - low sodium heart healthy   Complete by:  As directed    Discharge instructions   Complete by:  As directed    DO NOT TAKE MEDICATIONS THAT ARE NOT PRESCRIBED TO YOU    Increase activity slowly   Complete by:  As directed      Allergies as of 07/15/2018   No Known Allergies     Medication List    STOP taking these medications   cetirizine 10 MG tablet Commonly known as:  ZYRTEC   famotidine 20 MG tablet Commonly known as:  PEPCID   fluticasone 50 MCG/ACT nasal spray Commonly known as:  Flonase   promethazine 25 MG tablet Commonly known as:  PHENERGAN     TAKE these medications   albuterol 108 (90 Base) MCG/ACT inhaler Commonly known as:  VENTOLIN HFA Inhale 2 puffs into the lungs every 6 (six) hours as needed for wheezing or shortness of breath.   omeprazole 20 MG capsule Commonly known as:  PRILOSEC Take 1 capsule (20 mg total) by mouth daily.      Follow-up Information    PCP on your medicaid card Follow up.            Time coordinating discharge: 25 min  Signed:  Joseph Art DO  [facility] 07/15/2018, 11:10 AM

## 2018-07-15 NOTE — Progress Notes (Signed)
Patient discharged to home with instructions. 

## 2018-07-15 NOTE — Social Work (Signed)
CSW acknowledging consult for substance use resources.  CSW stopped by room for assessment, pt had been discharged home at that point before CSW was able to assess.   CSW signing off. Please consult if any additional needs arise.  Doy Hutching, LCSWA [facility] Clinical Social Work [phone]

## 2018-11-14 ENCOUNTER — Ambulatory Visit: Payer: Medicaid Other | Admitting: Podiatry

## 2018-11-18 ENCOUNTER — Ambulatory Visit: Payer: Medicaid Other | Admitting: Podiatry

## 2018-12-12 ENCOUNTER — Other Ambulatory Visit: Payer: Self-pay

## 2018-12-12 ENCOUNTER — Ambulatory Visit: Payer: Medicaid Other | Admitting: Podiatry

## 2018-12-12 ENCOUNTER — Other Ambulatory Visit: Payer: Self-pay | Admitting: Podiatry

## 2018-12-12 ENCOUNTER — Encounter: Payer: Self-pay | Admitting: Podiatry

## 2018-12-12 ENCOUNTER — Ambulatory Visit (INDEPENDENT_AMBULATORY_CARE_PROVIDER_SITE_OTHER): Payer: Medicaid Other

## 2018-12-12 DIAGNOSIS — M722 Plantar fascial fibromatosis: Secondary | ICD-10-CM

## 2018-12-12 DIAGNOSIS — M79672 Pain in left foot: Secondary | ICD-10-CM

## 2018-12-12 DIAGNOSIS — M79671 Pain in right foot: Secondary | ICD-10-CM

## 2018-12-12 NOTE — Patient Instructions (Signed)

## 2018-12-14 NOTE — Progress Notes (Signed)
Subjective:   Patient ID: Barbara Bruce, female   DOB: 33 y.o.   MRN: [id number]   HPI Patient states she started develop a lot of pain in the bottom of both heels again   ROS      Objective:  Physical Exam  Neurovascular status intact with exquisite discomfort plantar aspect heel region bilateral     Assessment:  Acute plantar fasciitis bilateral     Plan:  Sterile prep injected the fascia bilateral 3 mg Kenalog 5 mg Xylocaine and discussed supportive shoe gear usage and stretching exercises.  X-rays were negative for signs of fracture did indicate small plantar spur formation bilateral

## 2020-03-01 ENCOUNTER — Encounter: Payer: Self-pay | Admitting: Family Medicine

## 2020-03-01 ENCOUNTER — Ambulatory Visit (LOCAL_COMMUNITY_HEALTH_CENTER): Payer: Medicaid Other | Admitting: Family Medicine

## 2020-03-01 ENCOUNTER — Other Ambulatory Visit: Payer: Self-pay

## 2020-03-01 VITALS — BP 142/93 | HR 85 | Temp 97.7°F | Ht <= 58 in | Wt 207.2 lb

## 2020-03-01 DIAGNOSIS — Z1272 Encounter for screening for malignant neoplasm of vagina: Secondary | ICD-10-CM | POA: Diagnosis not present

## 2020-03-01 DIAGNOSIS — Z3009 Encounter for other general counseling and advice on contraception: Secondary | ICD-10-CM

## 2020-03-01 DIAGNOSIS — Z113 Encounter for screening for infections with a predominantly sexual mode of transmission: Secondary | ICD-10-CM

## 2020-03-01 DIAGNOSIS — Z01419 Encounter for gynecological examination (general) (routine) without abnormal findings: Secondary | ICD-10-CM | POA: Diagnosis not present

## 2020-03-01 NOTE — Progress Notes (Signed)
UPT negative as was [place]. Jossie Ng, RN

## 2020-03-01 NOTE — Progress Notes (Signed)
[facility] [address] Main Number: [phone]    Family Planning Visit- Initial Visit  Subjective:  Barbara Bruce is a 35 y.o.  No obstetric history on file.   being seen today for an initial well woman visit and to discuss family planning options.  She is currently using None for pregnancy prevention. Patient reports she does want a pregnancy in the next year.  Patient has the following medical conditions has Drug overdose, accidental or unintentional, initial encounter; Hematemesis; Elevated lactic acid level; Breast mass in female; Hyperglycemia; Essential hypertension; and Polysubstance abuse (HCC) on their problem list.  Chief Complaint  Patient presents with  . Annual Exam    Patient reports here for physical today.  Reports wanting a pregnancy   Patient denies and problems or concerns   Body mass index is 44.84 kg/m. - Patient is eligible for diabetes screening based on BMI and age >23?  not applicable HA1C ordered? not applicable  Patient reports 1  partner/s in last year. Desires STI screening?  Yes  Has patient been screened once for HCV in the past?  No  No results found for: HCVAB  Does the patient have current drug use (including MJ), have a partner with drug use, and/or has been incarcerated since last result? Yes  If yes-- Screen for HCV through [facility]   Does the patient meet criteria for HBV testing? Yes  Criteria:  -Household, sexual or needle sharing contact with HBV -History of drug use -HIV positive -Those with known Hep C   Health Maintenance Due  Topic Date Due  . Hepatitis C Screening  Never done  . COVID-19 Vaccine (1) Never done  . PAP SMEAR-Modifier  Never done  . INFLUENZA VACCINE  Never done    Review of Systems  All other systems reviewed and are negative.   The following portions of the patient's history were reviewed and updated as appropriate: allergies,  current medications, past family history, past medical history, past social history, past surgical history and problem list. Problem list updated.   See flowsheet for other program required questions.  Objective:   Vitals:   03/01/20 1552  BP: (!) 142/93  Pulse: 85  Temp: 97.7 F (36.5 C)  Weight: 207 lb 3.2 oz (94 kg)  Height: 4\' 9"  (1.448 m)    Physical Exam Vitals and nursing note reviewed.  Constitutional:      Appearance: Normal appearance. She is obese.  HENT:     Head: Normocephalic and atraumatic.     Mouth/Throat:     Mouth: Mucous membranes are moist.     Pharynx: No oropharyngeal exudate or posterior oropharyngeal erythema.  Eyes:     General: No scleral icterus. Cardiovascular:     Rate and Rhythm: Normal rate.     Pulses: Normal pulses.  Pulmonary:     Effort: Pulmonary effort is normal.     Breath sounds: Wheezing present.     Comments: Wheezes in upper airway.  Abdominal:     General: Abdomen is flat. Bowel sounds are normal.     Palpations: Abdomen is soft.  Musculoskeletal:        General: Normal range of motion.  Skin:    General: Skin is warm and dry.  Neurological:     General: No focal deficit present.     Mental Status: She is alert.       Assessment and Plan:  Barbara Bruce  is a 35 y.o. female presenting to the [facility] Department for an initial well woman exam/family planning visit  Contraception counseling: Reviewed all forms of birth control options in the tiered based approach. available including abstinence; over the counter/barrier methods; hormonal contraceptive medication including pill, patch, ring, injection,contraceptive implant, ECP; hormonal and nonhormonal IUDs; permanent sterilization options including vasectomy and the various tubal sterilization modalities. Risks, benefits, and typical effectiveness rates were reviewed.  Questions were answered.  Written information was also given to the patient to review.   Patient desires no birth control at this time.     Patient was not offered ECP based on patient desires pregnancy.    1. Family planning counseling Patient has family history of HTN and elevated b/p today.  Discussed with patient need to see PCP for follow up blood pressures and HTN management.    Patient has hiostory of asthma and wheezing heard on exam.  Patient denies SOB or chest discomfort.  Recommend to follow up with PCP for management.  Patient only normally goes to ER when feels like having asthma attack and medication is not working.    discussed need for preventive care.    Discussed achieving healthy pregnancy-  Smoking cessation, HTN management and hx of substance abuse.  Discussed weight management and initiation of PNV.    Denies any self harm but wants to speak to a therapist to help with anxiety.    Referral to [facility]    2. Smear, vaginal, as part of routine gynecological examination  Pap completed today.  Patient reports last pap has  > 5 years since last pap.    - IGP, rfx Aptima HPV ASCU - Pregnancy, urine  3. Screening examination for venereal disease  - Chlamydia/Gonorrhea [facility] - WET PREP FOR TRICH, YEAST, CLUE - HIV Antibody (routine testing w rflx) - RPR - Hepatitis B surface antigen -Hepatitis C   Patient accepted all screenings including wetprep, vaginal CT/GC and bloodwork for HIV/RPR/HCV/HBV    Wet prep results negative No treatment needed   Discussed time line for [facility] results and that patient will be called with positive results and encouraged patient to call if she had not heard in 2 weeks.   Recommended condom use with all sex      Return in about 1 year (around 03/01/2021) for annual well woman exam.  No future appointments.  Wendi Snipes, FNP

## 2020-03-02 LAB — HEPATITIS C ANTIBODY: Hep C Virus Ab: <0.1 s/co ratio (ref 0.0–0.9)

## 2020-03-02 LAB — RPR: RPR Ser Ql: NONREACTIVE

## 2020-03-02 LAB — HIV ANTIBODY (ROUTINE TESTING W REFLEX): HIV Screen 4th Generation wRfx: NONREACTIVE

## 2020-03-02 LAB — HEPATITIS B SURFACE ANTIGEN: Hepatitis B Surface Ag: NEGATIVE

## 2020-03-04 LAB — IGP, RFX APTIMA HPV ASCU: PAP Smear Comment: 0

## 2020-03-04 LAB — WET PREP FOR TRICH, YEAST, CLUE
Trichomonas Exam: NEGATIVE
Yeast Exam: NEGATIVE

## 2020-03-04 LAB — PREGNANCY, URINE: Preg Test, Ur: NEGATIVE

## 2020-03-20 ENCOUNTER — Emergency Department
Admission: EM | Admit: 2020-03-20 | Discharge: 2020-03-20 | Disposition: A | Discharge status: Home / Self Care | Payer: Medicaid Other | Attending: Emergency Medicine | Admitting: Emergency Medicine

## 2020-03-20 ENCOUNTER — Other Ambulatory Visit: Payer: Self-pay

## 2020-03-20 ENCOUNTER — Encounter: Payer: Self-pay | Admitting: *Deleted

## 2020-03-20 ENCOUNTER — Emergency Department: Payer: Medicaid Other

## 2020-03-20 DIAGNOSIS — J45909 Unspecified asthma, uncomplicated: Secondary | ICD-10-CM | POA: Diagnosis not present

## 2020-03-20 DIAGNOSIS — Z20822 Contact with and (suspected) exposure to covid-19: Secondary | ICD-10-CM | POA: Insufficient documentation

## 2020-03-20 DIAGNOSIS — F17210 Nicotine dependence, cigarettes, uncomplicated: Secondary | ICD-10-CM | POA: Insufficient documentation

## 2020-03-20 DIAGNOSIS — M545 Low back pain, unspecified: Secondary | ICD-10-CM | POA: Insufficient documentation

## 2020-03-20 DIAGNOSIS — B349 Viral infection, unspecified: Secondary | ICD-10-CM | POA: Diagnosis not present

## 2020-03-20 DIAGNOSIS — I10 Essential (primary) hypertension: Secondary | ICD-10-CM | POA: Diagnosis not present

## 2020-03-20 DIAGNOSIS — R509 Fever, unspecified: Secondary | ICD-10-CM | POA: Diagnosis present

## 2020-03-20 LAB — CBC
HCT: 37.5 % (ref 36.0–46.0)
Hemoglobin: 11.8 g/dL — ABNORMAL LOW (ref 12.0–15.0)
MCH: 26.4 pg (ref 26.0–34.0)
MCHC: 31.5 g/dL (ref 30.0–36.0)
MCV: 83.9 fL (ref 80.0–100.0)
Platelets: 261 10*3/uL (ref 150–400)
RBC: 4.47 MIL/uL (ref 3.87–5.11)
RDW: 14.8 % (ref 11.5–15.5)
WBC: 11.8 10*3/uL — ABNORMAL HIGH (ref 4.0–10.5)
nRBC: 0 % (ref 0.0–0.2)

## 2020-03-20 LAB — URINALYSIS, COMPLETE (UACMP) WITH MICROSCOPIC
Bilirubin Urine: NEGATIVE
Glucose, UA: NEGATIVE mg/dL
Hgb urine dipstick: NEGATIVE
Ketones, ur: NEGATIVE mg/dL
Nitrite: NEGATIVE
Protein, ur: NEGATIVE mg/dL
Specific Gravity, Urine: 1.015 (ref 1.005–1.030)
pH: 6 (ref 5.0–8.0)

## 2020-03-20 LAB — SARS CORONAVIRUS 2 (TAT 6-24 HRS): SARS Coronavirus 2: NEGATIVE

## 2020-03-20 LAB — BASIC METABOLIC PANEL
Anion gap: 10 (ref 5–15)
BUN: 8 mg/dL (ref 6–20)
CO2: 27 mmol/L (ref 22–32)
Calcium: 8.8 mg/dL — ABNORMAL LOW (ref 8.9–10.3)
Chloride: 101 mmol/L (ref 98–111)
Creatinine, Ser: 0.58 mg/dL (ref 0.44–1.00)
GFR, Estimated: >60 mL/min (ref >60)
Glucose, Bld: 141 mg/dL — ABNORMAL HIGH (ref 70–99)
Potassium: 3.5 mmol/L (ref 3.5–5.1)
Sodium: 138 mmol/L (ref 135–145)

## 2020-03-20 LAB — POC URINE PREG, ED: Preg Test, Ur: NEGATIVE

## 2020-03-20 LAB — TROPONIN I (HIGH SENSITIVITY): Troponin I (High Sensitivity): 3 ng/L (ref <18)

## 2020-03-20 LAB — POC SARS CORONAVIRUS 2 AG -  ED: SARS Coronavirus 2 Ag: NEGATIVE

## 2020-03-20 MED ORDER — KETOROLAC TROMETHAMINE 30 MG/ML IJ SOLN
30.0000 mg | Freq: Once | INTRAMUSCULAR | Status: AC
  Administered 2020-03-20: 30 mg via INTRAMUSCULAR
  Filled 2020-03-20: qty 1

## 2020-03-20 MED ORDER — ONDANSETRON 4 MG PO TBDP
4.0000 mg | ORAL_TABLET | Freq: Once | ORAL | Status: AC
  Administered 2020-03-20: 4 mg via ORAL
  Filled 2020-03-20: qty 1

## 2020-03-20 NOTE — ED Provider Notes (Signed)
[facility] Emergency Department Provider Note   ____________________________________________   Event Date/Time   First MD Initiated Contact with Patient 03/20/20 [phone]     (approximate)  I have reviewed the triage vital signs and the nursing notes.   HISTORY  Chief Complaint Weakness and Back Pain    HPI Barbara Bruce is a 35 y.o. female with past medical history of hypertension, polysubstance abuse, and asthma who presents to the ED complaining of back pain.  Patient states that she woke up this morning with chills and subjective fevers, aching pain in the middle of her lower back, nausea, vomiting, and diarrhea.  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  She endorses a productive cough, nasal congestion occasional difficulty breathing and soreness in her chest.  She denies any difficulty breathing at rest.  She has not noticed any pain or swelling in her legs.  She denies any pain in her abdomen, dysuria, hematuria, vaginal bleeding, or vaginal discharge.  She has not had any saddle anesthesia and denies any numbness or weakness in her legs, also denies recent trauma to her back.        Past Medical History:  Diagnosis Date  . Asthma   . GERD (gastroesophageal reflux disease)   . Glands swollen    Per client, in neck  . History of anemia   . History of blood transfusion   . History of breast lump   . History of shortness of breath   . History of TMJ disorder   . History of weight gain   . Hypertension   . Plantar fasciitis, bilateral   . Polysubstance abuse (HCC)    repeatedly positive for cocaine, THC  . Ulcer     Patient Active Problem List   Diagnosis Date Noted  . Drug overdose, accidental or unintentional, initial encounter 07/14/2018  . Hematemesis 07/14/2018  . Elevated lactic acid level 07/14/2018  . Breast mass  in female 07/14/2018  . Hyperglycemia 07/14/2018  . Essential hypertension 07/14/2018  . Polysubstance abuse (HCC) 07/14/2018    Past Surgical History:  Procedure Laterality Date  . history of tooth extraction     12 teeth removed under general anesthesia in 2020.  Marland Kitchen infertility      Prior to Admission medications   Medication Sig Start Date End Date Taking? Authorizing Provider  albuterol (VENTOLIN HFA) 108 (90 Base) MCG/ACT inhaler Inhale 2 puffs into the lungs every 6 (six) hours as needed for wheezing or shortness of breath. 07/15/18   Joseph Art, DO  omeprazole (PRILOSEC) 20 MG capsule Take 1 capsule (20 mg total) by mouth daily. Patient not taking: Reported on 03/01/2020 07/15/18   Joseph Art, DO    Allergies Patient has no known allergies.  Family History  Problem Relation Age of Onset  . Hypertension Paternal Grandmother   . Hypertension Father   . Diabetes Father   . Kidney failure Father   . Hypertension Mother   . ADD / ADHD Sister     Social History Social History   Tobacco Use  . Smoking status: Current Every Day Smoker    Packs/day: 1.00    Years: 12.00    Pack years: 12.00    Types: Cigarettes  . Smokeless tobacco: Never Used  Vaping Use  . Vaping Use: Never used  Substance Use  Topics  . Alcohol use: Yes    Comment: Reports last ETOH use 02/03/2020  . Drug use: Yes    Types: Cocaine, Marijuana    Comment: Reports last marijuana 10/2019, last powder cocaine use 09/2019, hx of 07/2018 overdose with "heroin pills" per client.    Review of Systems  Constitutional: Positive for fever/chills Eyes: No visual changes.  Positive for nasal congestion. ENT: No sore throat. Cardiovascular: Positive for chest pain. Respiratory: Positive for cough and shortness of breath. Gastrointestinal: No abdominal pain.  Positive for nausea, vomiting, and diarrhea.  No constipation. Genitourinary: Negative for dysuria. Musculoskeletal: Positive for back  pain. Skin: Negative for rash. Neurological: Negative for headaches, focal weakness or numbness.  ____________________________________________   PHYSICAL EXAM:  VITAL SIGNS: ED Triage Vitals  Enc Vitals Group     BP 03/20/20 0529 (!) 166/97     Pulse Rate 03/20/20 0529 86     Resp 03/20/20 0529 20     Temp 03/20/20 0529 97.9 F (36.6 C)     Temp Source 03/20/20 0529 Oral     SpO2 03/20/20 0529 95 %     Weight 03/20/20 0531 207 lb (93.9 kg)     Height 03/20/20 0531 4\' 9"  (1.448 m)     Head Circumference --      Peak Flow --      Pain Score 03/20/20 0531 10     Pain Loc --      Pain Edu? --      Excl. in GC? --     Constitutional: Alert and oriented. Eyes: Conjunctivae are normal. Head: Atraumatic. Nose: No congestion/rhinnorhea. Mouth/Throat: Mucous membranes are moist. Neck: Normal ROM Cardiovascular: Normal rate, regular rhythm. Grossly normal heart sounds.  2+ radial and DP pulses bilaterally. Respiratory: Normal respiratory effort.  No retractions. Lungs CTAB. Gastrointestinal: Soft and nontender. No distention. Genitourinary: deferred Musculoskeletal: No lower extremity tenderness nor edema.  Midline lumbar spinal tenderness to palpation. Neurologic:  Normal speech and language. No gross focal neurologic deficits are appreciated. Skin:  Skin is warm, dry and intact. No rash noted. Psychiatric: Mood and affect are normal. Speech and behavior are normal.  ____________________________________________   LABS (all labs ordered are listed, but only abnormal results are displayed)  Labs Reviewed  BASIC METABOLIC PANEL - Abnormal; Notable for the following components:      Result Value   Glucose, Bld 141 (*)    Calcium 8.8 (*)    All other components within normal limits  CBC - Abnormal; Notable for the following components:   WBC 11.8 (*)    Hemoglobin 11.8 (*)    All other components within normal limits  URINALYSIS, COMPLETE (UACMP) WITH MICROSCOPIC -  Abnormal; Notable for the following components:   Color, Urine YELLOW (*)    APPearance HAZY (*)    Leukocytes,Ua SMALL (*)    Bacteria, UA RARE (*)    All other components within normal limits  SARS CORONAVIRUS 2 (TAT 6-24 HRS)  POC URINE PREG, ED  POC SARS CORONAVIRUS 2 AG -  ED  TROPONIN I (HIGH SENSITIVITY)   ____________________________________________  EKG  ED ECG REPORT I, 05/18/20, the attending physician, personally viewed and interpreted this ECG.   Date: 03/20/2020  EKG Time: 5:27  Rate: 84  Rhythm: normal sinus rhythm  Axis: Normal  Intervals:none  ST&T Change: None   PROCEDURES  Procedure(s) performed (including Critical Care):  Procedures   ____________________________________________   INITIAL IMPRESSION / ASSESSMENT AND PLAN /  ED COURSE       35 year old female with past medical history of hypertension, polysubstance abuse, and asthma who presents to the ED complaining of low back pain, nausea, vomiting, diarrhea, cough, congestion, and chills since waking up this morning.  EKG shows no evidence of arrhythmia or ischemia and troponin is negative, I doubt ACS or PE.  Chest x-ray reviewed by me and shows no infiltrate, edema, or effusion.  Labs are also unremarkable, pregnancy testing is negative.  Symptoms are most consistent with viral syndrome and we will perform testing for COVID-19.  No findings concerning for cauda equina and I do not feel imaging of her low back is indicated given atraumatic pain.  We will treat with Toradol and Zofran while awaiting results of Covid test.  Initial rapid Covid test is negative, we will send follow-up PCR.  Patient reports feeling much better following treatment with Toradol and Zofran.  She is appropriate for discharge home with PCP follow-up and was counseled to return to the ED for new worsening symptoms.  Patient agrees with plan.      ____________________________________________   FINAL CLINICAL  IMPRESSION(S) / ED DIAGNOSES  Final diagnoses:  Viral syndrome  Acute midline low back pain without sciatica     ED Discharge Orders    None       Note:  This document was prepared using Dragon voice recognition software and may include unintentional dictation errors.   Chesley Noon, MD 03/20/20 [phone]

## 2020-03-20 NOTE — ED Triage Notes (Signed)
Pt to ED reporting lower bilateral back pain that started this morning after waking. Pt also reporting generalized weakness, cough, chest pain and SOB that all also started this morning. Nasal congestion noted in triage and increased WOB after ambulating to triage room.

## 2020-03-20 NOTE — ED Notes (Signed)
Pt alert and oriented X 4, stable for discharge. RR even and unlabored, color WNL. Discussed discharge instructions and follow-up as directed. Discharge medications discussed if prescribed. Pt had opportunity to ask questions, and RN to provide patient/family eduction.  

## 2020-10-11 ENCOUNTER — Observation Stay (HOSPITAL_COMMUNITY)
Admission: EM | Admit: 2020-10-11 | Discharge: 2020-10-11 | Disposition: A | Discharge status: Home / Self Care | Payer: Medicaid Other | Attending: Internal Medicine | Admitting: Internal Medicine

## 2020-10-11 ENCOUNTER — Other Ambulatory Visit: Payer: Self-pay

## 2020-10-11 ENCOUNTER — Emergency Department (HOSPITAL_COMMUNITY): Payer: Medicaid Other

## 2020-10-11 ENCOUNTER — Encounter (HOSPITAL_COMMUNITY): Payer: Self-pay | Admitting: Emergency Medicine

## 2020-10-11 DIAGNOSIS — D649 Anemia, unspecified: Secondary | ICD-10-CM | POA: Diagnosis not present

## 2020-10-11 DIAGNOSIS — I10 Essential (primary) hypertension: Secondary | ICD-10-CM | POA: Insufficient documentation

## 2020-10-11 DIAGNOSIS — J9601 Acute respiratory failure with hypoxia: Secondary | ICD-10-CM | POA: Insufficient documentation

## 2020-10-11 DIAGNOSIS — Z20822 Contact with and (suspected) exposure to covid-19: Secondary | ICD-10-CM | POA: Insufficient documentation

## 2020-10-11 DIAGNOSIS — J4521 Mild intermittent asthma with (acute) exacerbation: Secondary | ICD-10-CM | POA: Diagnosis not present

## 2020-10-11 DIAGNOSIS — J45901 Unspecified asthma with (acute) exacerbation: Secondary | ICD-10-CM | POA: Diagnosis present

## 2020-10-11 DIAGNOSIS — Z79899 Other long term (current) drug therapy: Secondary | ICD-10-CM | POA: Diagnosis not present

## 2020-10-11 DIAGNOSIS — F17210 Nicotine dependence, cigarettes, uncomplicated: Secondary | ICD-10-CM | POA: Insufficient documentation

## 2020-10-11 DIAGNOSIS — K219 Gastro-esophageal reflux disease without esophagitis: Secondary | ICD-10-CM | POA: Diagnosis not present

## 2020-10-11 DIAGNOSIS — J4541 Moderate persistent asthma with (acute) exacerbation: Principal | ICD-10-CM | POA: Insufficient documentation

## 2020-10-11 DIAGNOSIS — Z720 Tobacco use: Secondary | ICD-10-CM

## 2020-10-11 DIAGNOSIS — R0600 Dyspnea, unspecified: Secondary | ICD-10-CM | POA: Diagnosis present

## 2020-10-11 LAB — BASIC METABOLIC PANEL
Anion gap: 8 (ref 5–15)
BUN: 8 mg/dL (ref 6–20)
CO2: 30 mmol/L (ref 22–32)
Calcium: 9 mg/dL (ref 8.9–10.3)
Chloride: 100 mmol/L (ref 98–111)
Creatinine, Ser: 0.59 mg/dL (ref 0.44–1.00)
GFR, Estimated: >60 mL/min (ref >60)
Glucose, Bld: 113 mg/dL — ABNORMAL HIGH (ref 70–99)
Potassium: 3.7 mmol/L (ref 3.5–5.1)
Sodium: 138 mmol/L (ref 135–145)

## 2020-10-11 LAB — RESPIRATORY PANEL BY PCR

## 2020-10-11 LAB — CBC
HCT: 38.2 % (ref 36.0–46.0)
Hemoglobin: 11.8 g/dL — ABNORMAL LOW (ref 12.0–15.0)
MCH: 27 pg (ref 26.0–34.0)
MCHC: 30.9 g/dL (ref 30.0–36.0)
MCV: 87.4 fL (ref 80.0–100.0)
Platelets: 262 10*3/uL (ref 150–400)
RBC: 4.37 MIL/uL (ref 3.87–5.11)
RDW: 13.9 % (ref 11.5–15.5)
WBC: 9.2 10*3/uL (ref 4.0–10.5)
nRBC: 0 % (ref 0.0–0.2)

## 2020-10-11 LAB — RESP PANEL BY RT-PCR (FLU A&B, COVID) ARPGX2
Influenza A by PCR: NEGATIVE
Influenza B by PCR: NEGATIVE
SARS Coronavirus 2 by RT PCR: NEGATIVE

## 2020-10-11 MED ORDER — IBUPROFEN 200 MG PO TABS
600.0000 mg | ORAL_TABLET | Freq: Once | ORAL | Status: AC
  Administered 2020-10-11: 600 mg via ORAL
  Filled 2020-10-11: qty 3

## 2020-10-11 MED ORDER — METHYLPREDNISOLONE SODIUM SUCC 125 MG IJ SOLR
125.0000 mg | Freq: Once | INTRAMUSCULAR | Status: AC
  Administered 2020-10-11: 125 mg via INTRAVENOUS
  Filled 2020-10-11: qty 2

## 2020-10-11 MED ORDER — PREDNISONE 20 MG PO TABS
40.0000 mg | ORAL_TABLET | Freq: Every day | ORAL | Status: DC
Start: 2020-10-12 — End: 2020-10-11

## 2020-10-11 MED ORDER — ALBUTEROL SULFATE (2.5 MG/3ML) 0.083% IN NEBU
5.0000 mg | INHALATION_SOLUTION | Freq: Once | RESPIRATORY_TRACT | Status: AC
  Administered 2020-10-11: 5 mg via RESPIRATORY_TRACT
  Filled 2020-10-11: qty 6

## 2020-10-11 MED ORDER — NICOTINE 21 MG/24HR TD PT24: 21.0000 mg | MEDICATED_PATCH | Freq: Every day | TRANSDERMAL | 0 refills | Status: AC

## 2020-10-11 MED ORDER — PANTOPRAZOLE SODIUM 40 MG PO TBEC
40.0000 mg | DELAYED_RELEASE_TABLET | Freq: Every day | ORAL | Status: DC
  Administered 2020-10-11: 40 mg via ORAL
  Filled 2020-10-11: qty 1

## 2020-10-11 MED ORDER — ALBUTEROL SULFATE HFA 108 (90 BASE) MCG/ACT IN AERS: 2.0000 | INHALATION_SPRAY | RESPIRATORY_TRACT | 2 refills | Status: DC | PRN

## 2020-10-11 MED ORDER — MOMETASONE FURO-FORMOTEROL FUM 200-5 MCG/ACT IN AERO
2.0000 | INHALATION_SPRAY | Freq: Two times a day (BID) | RESPIRATORY_TRACT | Status: DC
  Filled 2020-10-11: qty 8.8

## 2020-10-11 MED ORDER — PREDNISONE 20 MG PO TABS: 40.0000 mg | ORAL_TABLET | Freq: Every day | ORAL | 0 refills | Status: AC

## 2020-10-11 MED ORDER — IPRATROPIUM BROMIDE 0.02 % IN SOLN
0.5000 mg | Freq: Once | RESPIRATORY_TRACT | Status: AC
  Administered 2020-10-11: 0.5 mg via RESPIRATORY_TRACT
  Filled 2020-10-11: qty 2.5

## 2020-10-11 MED ORDER — ACETAMINOPHEN 325 MG PO TABS: 650.0000 mg | ORAL_TABLET | Freq: Four times a day (QID) | ORAL | Status: DC | PRN

## 2020-10-11 MED ORDER — NICOTINE 21 MG/24HR TD PT24
21.0000 mg | MEDICATED_PATCH | Freq: Every day | TRANSDERMAL | Status: DC
  Administered 2020-10-11: 21 mg via TRANSDERMAL
  Filled 2020-10-11: qty 1

## 2020-10-11 MED ORDER — MORPHINE SULFATE (PF) 2 MG/ML IV SOLN
2.0000 mg | INTRAVENOUS | Status: DC | PRN
Start: 2020-10-11 — End: 2020-10-11

## 2020-10-11 MED ORDER — METHYLPREDNISOLONE SODIUM SUCC 125 MG IJ SOLR
125.0000 mg | INTRAMUSCULAR | Status: AC
  Administered 2020-10-11: 125 mg via INTRAVENOUS
  Filled 2020-10-11: qty 2

## 2020-10-11 MED ORDER — ONDANSETRON HCL 4 MG/2ML IJ SOLN: 4.0000 mg | Freq: Four times a day (QID) | INTRAMUSCULAR | Status: DC | PRN

## 2020-10-11 MED ORDER — HEPARIN SODIUM (PORCINE) 5000 UNIT/ML IJ SOLN
5000.0000 [IU] | Freq: Three times a day (TID) | INTRAMUSCULAR | Status: DC
  Administered 2020-10-11: 5000 [IU] via SUBCUTANEOUS
  Filled 2020-10-11: qty 1

## 2020-10-11 MED ORDER — ACETAMINOPHEN 650 MG RE SUPP: 650.0000 mg | Freq: Four times a day (QID) | RECTAL | Status: DC | PRN

## 2020-10-11 MED ORDER — ALBUTEROL SULFATE (2.5 MG/3ML) 0.083% IN NEBU: 2.5000 mg | INHALATION_SOLUTION | RESPIRATORY_TRACT | Status: DC | PRN

## 2020-10-11 MED ORDER — MAGNESIUM SULFATE 2 GM/50ML IV SOLN
2.0000 g | Freq: Once | INTRAVENOUS | Status: AC
  Administered 2020-10-11: 2 g via INTRAVENOUS
  Filled 2020-10-11: qty 50

## 2020-10-11 MED ORDER — IPRATROPIUM-ALBUTEROL 0.5-2.5 (3) MG/3ML IN SOLN
3.0000 mL | Freq: Once | RESPIRATORY_TRACT | Status: AC
  Administered 2020-10-11: 3 mL via RESPIRATORY_TRACT
  Filled 2020-10-11: qty 3

## 2020-10-11 MED ORDER — ALBUTEROL (5 MG/ML) CONTINUOUS INHALATION SOLN
10.0000 mg/h | INHALATION_SOLUTION | RESPIRATORY_TRACT | Status: AC
  Administered 2020-10-11: 10 mg/h via RESPIRATORY_TRACT
  Filled 2020-10-11: qty 40

## 2020-10-11 MED ORDER — IPRATROPIUM-ALBUTEROL 0.5-2.5 (3) MG/3ML IN SOLN
3.0000 mL | Freq: Four times a day (QID) | RESPIRATORY_TRACT | Status: DC
  Administered 2020-10-11: 3 mL via RESPIRATORY_TRACT
  Filled 2020-10-11: qty 3

## 2020-10-11 MED ORDER — OXYCODONE HCL 5 MG PO TABS: 5.0000 mg | ORAL_TABLET | ORAL | Status: DC | PRN

## 2020-10-11 MED ORDER — ALBUTEROL SULFATE (2.5 MG/3ML) 0.083% IN NEBU
2.5000 mg | INHALATION_SOLUTION | Freq: Once | RESPIRATORY_TRACT | Status: AC
  Administered 2020-10-11: 2.5 mg via RESPIRATORY_TRACT
  Filled 2020-10-11: qty 3

## 2020-10-11 MED ORDER — ONDANSETRON HCL 4 MG PO TABS: 4.0000 mg | ORAL_TABLET | Freq: Four times a day (QID) | ORAL | Status: DC | PRN

## 2020-10-11 NOTE — H&P (Signed)
TRH H&P    Patient Demographics:    Barbara Bruce, is a 35 y.o. female  MRN: [id number]  DOB - 05/24/1985  Admit Date - 10/11/2020  Referring MD/NP/PA: Pilar PlateBero  Outpatient Primary MD for the patient is Patient, No Pcp Per (Inactive)  Patient coming from: Home  Chief complaint-dyspnea   HPI:    Barbara Bruce  is a 35 y.o. female, with history of asthma exacerbation, GERD, tobacco use disorder, presents the ED with a chief complaint of dyspnea.  She reports that it started approximately 4 days ago.  She had associated cough that is productive of sputum that was initially green and is now clear.  The cough is been constant.  She has not had any fevers but has had myalgias.  Patient also has an associated wheeze.  Diabetes has been worse over the last 4 days.  She has tightness in her bilateral ribs at the level of T5-T6.  She has not taken any of her albuterol at home.  She reports she does not know why she has been taking it.  She has had exposure to COVID at work-she works in a toddler and infant daycare room.  She is fully vaccinated for COVID.  Patient has no new exposures in her environment, including no recent wounds, no new detergent, no new perfume, no new pets.  Patient has a history of being intubated for an asthma exacerbation 2 years ago.  Patient has no other complaints at this time.  Patient currently smokes a pack per day.  She drinks alcohol once a week.  She does not use illicit drugs.  She is fully vaccinated for COVID.  Patient is full code.  In the ED Temp 98.5, heart rate 84-93, respiratory rate 15-20, blood pressure 103/57, maintaining saturations on 2 L nasal cannula Had initially dropped into the 80s on room air Negative COVID and flu Chest x-ray shows no active disease No leukocytosis, hemoglobin stable at 11.8 Chemistry panel is unremarkable Albuterol 2.5 mg, 5 mg, 10 mg given in the  ED Atrovent, ibuprofen, DuoNeb, mag sulfate, Solu-Medrol also given Admission requested for further management of asthma exacerbation    Review of systems:    In addition to the HPI above,  No Fever-chills, Admits to headache, No changes with Vision or hearing, No problems swallowing food or Liquids, No Abdominal pain, No Nausea or Vomiting, bowel movements are regular, No Blood in stool or Urine, No dysuria, No new skin rashes or bruises, Admits to myalgias No new weakness, tingling, numbness in any extremity, No recent weight gain or loss, No polyuria, polydypsia or polyphagia, No significant Mental Stressors.  All other systems reviewed and are negative.    Past History of the following :    Past Medical History:  Diagnosis Date   Asthma    GERD (gastroesophageal reflux disease)    Glands swollen    Per client, in neck   History of anemia    History of blood transfusion    History of breast lump    History of  shortness of breath    History of TMJ disorder    History of weight gain    Hypertension    Plantar fasciitis, bilateral    Polysubstance abuse (HCC)    repeatedly positive for cocaine, THC   Ulcer       Past Surgical History:  Procedure Laterality Date   history of tooth extraction     12 teeth removed under general anesthesia in 2020.   infertility        Social History:      Social History   Tobacco Use   Smoking status: Every Day    Packs/day: 1.00    Years: 12.00    Pack years: 12.00    Types: Cigarettes   Smokeless tobacco: Never  Substance Use Topics   Alcohol use: Yes    Comment: Reports last ETOH use 02/03/2020       Family History :     Family History  Problem Relation Age of Onset   Hypertension Paternal Grandmother    Hypertension Father    Diabetes Father    Kidney failure Father    Hypertension Mother    ADD / ADHD Sister       Home Medications:   Prior to Admission medications   Medication Sig Start Date  End Date Taking? Authorizing Provider  albuterol (VENTOLIN HFA) 108 (90 Base) MCG/ACT inhaler Inhale 2 puffs into the lungs every 6 (six) hours as needed for wheezing or shortness of breath. 07/15/18   Joseph Art, DO  omeprazole (PRILOSEC) 20 MG capsule Take 1 capsule (20 mg total) by mouth daily. Patient not taking: Reported on 03/01/2020 07/15/18   Joseph Art, DO     Allergies:    No Known Allergies   Physical Exam:   Vitals  Blood pressure 133/74, pulse (!) 102, temperature 98.5 F (36.9 C), temperature source Oral, resp. rate 20, height 4\' 9"  (1.448 m), weight 90.7 kg, SpO2 100 %.  1.  General: Patient lying supine in bed,  no acute distress   2. Psychiatric: Alert and oriented x 3, mood and behavior normal for situation, pleasant and cooperative with exam   3. Neurologic: Speech and language are normal, face is symmetric, moves all 4 extremities voluntarily, at baseline without acute deficits on limited exam   4. HEENMT:  Head is atraumatic, normocephalic, pupils reactive to light, neck is supple, trachea is midline, mucous membranes are moist   5. Respiratory : Diffuse moderate wheezing, no rhonchi, no rales, no cyanosis, mild increase in work of breathing, and some accessory muscle use, but speaking in full sentences   6. Cardiovascular : Heart rate normal, rhythm is regular, no murmurs, rubs or gallops, no peripheral edema, peripheral pulses palpated   7. Gastrointestinal:  Abdomen is soft, nondistended, nontender to palpation bowel sounds active, no masses or organomegaly palpated   8. Skin:  Skin is warm, dry and intact without rashes, acute lesions, or ulcers on limited exam   9.Musculoskeletal:  No acute deformities or trauma, no asymmetry in tone, no peripheral edema, peripheral pulses palpated, no tenderness to palpation in the extremities     Data Review:    CBC Recent Labs  Lab 10/11/20 0248  WBC 9.2  HGB 11.8*  HCT 38.2  PLT 262  MCV 87.4   MCH 27.0  MCHC 30.9  RDW 13.9   ------------------------------------------------------------------------------------------------------------------  Results for orders placed or performed during the hospital encounter of 10/11/20 (from the past 48 hour(s))  Resp  Panel by RT-PCR (Flu A&B, Covid) Nasopharyngeal Swab     Status: None   Collection Time: 10/11/20  2:01 AM   Specimen: Nasopharyngeal Swab; Nasopharyngeal(NP) swabs in vial transport medium  Result Value Ref Range   SARS Coronavirus 2 by RT PCR NEGATIVE NEGATIVE    Comment: (NOTE) SARS-CoV-2 target nucleic acids are NOT DETECTED.  The SARS-CoV-2 RNA is generally detectable in upper respiratory specimens during the acute phase of infection. The lowest concentration of SARS-CoV-2 viral copies this assay can detect is 138 copies/mL. A negative result does not preclude SARS-Cov-2 infection and should not be used as the sole basis for treatment or other patient management decisions. A negative result may occur with  improper specimen collection/handling, submission of specimen other than nasopharyngeal swab, presence of viral mutation(s) within the areas targeted by this assay, and inadequate number of viral copies(<138 copies/mL). A negative result must be combined with clinical observations, patient history, and epidemiological information. The expected result is Negative.  Fact Sheet for Patients:  BloggerCourse.com  Fact Sheet for Healthcare Providers:  SeriousBroker.it  This test is no t yet approved or cleared by the [place] FDA and  has been authorized for detection and/or diagnosis of SARS-CoV-2 by FDA under an Emergency Use Authorization (EUA). This EUA will remain  in effect (meaning this test can be used) for the duration of the COVID-19 declaration under Section 564(b)(1) of the Act, 21 U.S.C.section 360bbb-3(b)(1), unless the authorization is terminated   or revoked sooner.       Influenza A by PCR NEGATIVE NEGATIVE   Influenza B by PCR NEGATIVE NEGATIVE    Comment: (NOTE) The Xpert Xpress SARS-CoV-2/FLU/RSV plus assay is intended as an aid in the diagnosis of influenza from Nasopharyngeal swab specimens and should not be used as a sole basis for treatment. Nasal washings and aspirates are unacceptable for Xpert Xpress SARS-CoV-2/FLU/RSV testing.  Fact Sheet for Patients: BloggerCourse.com  Fact Sheet for Healthcare Providers: SeriousBroker.it  This test is not yet approved or cleared by the [place] FDA and has been authorized for detection and/or diagnosis of SARS-CoV-2 by FDA under an Emergency Use Authorization (EUA). This EUA will remain in effect (meaning this test can be used) for the duration of the COVID-19 declaration under Section 564(b)(1) of the Act, 21 U.S.C. section 360bbb-3(b)(1), unless the authorization is terminated or revoked.  Performed at [facility], [address] [place], [place] [address]   CBC     Status: Abnormal   Collection Time: 10/11/20  2:48 AM  Result Value Ref Range   WBC 9.2 4.0 - 10.5 K/uL   RBC 4.37 3.87 - 5.11 MIL/uL   Hemoglobin 11.8 (L) 12.0 - 15.0 g/dL   HCT 96.2 95.2 - 84.1 %   MCV 87.4 80.0 - 100.0 fL   MCH 27.0 26.0 - 34.0 pg   MCHC 30.9 30.0 - 36.0 g/dL   RDW 32.4 40.1 - 02.7 %   Platelets 262 150 - 400 K/uL   nRBC 0.0 0.0 - 0.2 %    Comment: Performed at [facility], [address], [place], [place] [address]  Basic metabolic panel     Status: Abnormal   Collection Time: 10/11/20  2:48 AM  Result Value Ref Range   Sodium 138 135 - 145 mmol/L   Potassium 3.7 3.5 - 5.1 mmol/L   Chloride 100 98 - 111 mmol/L   CO2 30 22 - 32 mmol/L   Glucose, Bld 113 (H)  70 - 99 mg/dL    Comment: Glucose reference range applies only to samples taken after fasting for at least 8 hours.   BUN 8 6  - 20 mg/dL   Creatinine, Ser 0.34 0.44 - 1.00 mg/dL   Calcium 9.0 8.9 - 91.7 mg/dL   GFR, Estimated >91 >50 mL/min    Comment: (NOTE) Calculated using the CKD-EPI Creatinine Equation (2021)    Anion gap 8 5 - 15    Comment: Performed at [facility], [address] [place], [place] [address]    Chemistries  Recent Labs  Lab 10/11/20 0248  NA 138  K 3.7  CL 100  CO2 30  GLUCOSE 113*  BUN 8  CREATININE 0.59  CALCIUM 9.0   ------------------------------------------------------------------------------------------------------------------  ------------------------------------------------------------------------------------------------------------------ GFR: Estimated Creatinine Clearance: 92 mL/min (by C-G formula based on SCr of 0.59 mg/dL). Liver Function Tests: No results for input(s): AST, ALT, ALKPHOS, BILITOT, PROT, ALBUMIN in the last 168 hours. No results for input(s): LIPASE, AMYLASE in the last 168 hours. No results for input(s): AMMONIA in the last 168 hours. Coagulation Profile: No results for input(s): INR, PROTIME in the last 168 hours. Cardiac Enzymes: No results for input(s): CKTOTAL, CKMB, CKMBINDEX, TROPONINI in the last 168 hours. BNP (last 3 results) No results for input(s): PROBNP in the last 8760 hours. HbA1C: No results for input(s): HGBA1C in the last 72 hours. CBG: No results for input(s): GLUCAP in the last 168 hours. Lipid Profile: No results for input(s): CHOL, HDL, LDLCALC, TRIG, CHOLHDL, LDLDIRECT in the last 72 hours. Thyroid Function Tests: No results for input(s): TSH, T4TOTAL, FREET4, T3FREE, THYROIDAB in the last 72 hours. Anemia Panel: No results for input(s): VITAMINB12, FOLATE, FERRITIN, TIBC, IRON, RETICCTPCT in the last 72 hours.  --------------------------------------------------------------------------------------------------------------- Urine analysis:    Component Value Date/Time   COLORURINE YELLOW (A)  03/20/2020 0539   APPEARANCEUR HAZY (A) 03/20/2020 0539   LABSPEC 1.015 03/20/2020 0539   PHURINE 6.0 03/20/2020 0539   GLUCOSEU NEGATIVE 03/20/2020 0539   HGBUR NEGATIVE 03/20/2020 0539   BILIRUBINUR NEGATIVE 03/20/2020 0539   KETONESUR NEGATIVE 03/20/2020 0539   PROTEINUR NEGATIVE 03/20/2020 0539   UROBILINOGEN 0.2 10/21/2014 1604   NITRITE NEGATIVE 03/20/2020 0539   LEUKOCYTESUR SMALL (A) 03/20/2020 0539      Imaging Results:    DG Chest Portable 1 View  Result Date: 10/11/2020 CLINICAL DATA:  Chest pain and shortness of breath. EXAM: PORTABLE CHEST 1 VIEW COMPARISON:  Chest radiograph dated 03/20/2020. FINDINGS: No focal consolidation, pleural effusion pneumothorax. The cardiac silhouette is within normal limits. No acute osseous pathology. IMPRESSION: No active cardiopulmonary disease. Electronically Signed   By: Elgie Collard M.D.   On: 10/11/2020 02:33      Assessment & Plan:    Principal Problem:   Asthma exacerbation Active Problems:   Chronic anemia   GERD (gastroesophageal reflux disease)   Tobacco abuse disorder   Acute respiratory failure with hypoxia (HCC)   Asthma exacerbation Diffuse moderate wheezing Continue scheduled DuoNebs, as needed albuterol, steroid No antibiotics indicated at this time as there is no leukocytosis, no infiltrate on chest x-ray Patient does have cough -culture sputum Respiratory pathogen panel COVID-negative Monitor on telemetry Acute respiratory failure with hypoxia Secondary to above Continue above treatment, wean off oxygen as tolerated Continue to monitor Chronic anemia No active signs or symptoms of bleeding Stable from last lab draw in February Continue to monitor Tobacco use disorder Counseled patient extensively on the importance of cessation Nicotine  patch ordered GERD Continue PPI   DVT Prophylaxis-   Heparin - SCDs   AM Labs Ordered, also please review Full Orders  Family Communication: Admission,  patients condition and plan of care including tests being ordered have been discussed with the patient and husband who indicate understanding and agree with the plan and Code Status.  Code Status:  Full  Admission status: Observation    The patient's presenting symptoms include dyspnea, cough. The worrisome physical exam findings include Hypoxia, wheeze The chronic co-morbidities include GERD.     Time spent in minutes : 65   Travarius Lange B Zierle-Ghosh DO

## 2020-10-11 NOTE — ED Notes (Signed)
Pt ambulatory to bathroom on room air, standby assistance. Pt O2 sats noted to be 97% on RA on return to room. Audible expiratory wheezing still present. Will continue to monitor.

## 2020-10-11 NOTE — ED Notes (Signed)
Pt hypoxic in 80s RA upon provider entry into room, placed on 2L [place] with good effect.

## 2020-10-11 NOTE — ED Provider Notes (Addendum)
[facility]-EMERGENCY DEPT Provider Note   CSN: [id number] Arrival date & time: 10/11/20  0110     History No chief complaint on file.   Barbara Bruce is a 35 y.o. female presents to the emergency department with complaints of asthma exacerbation.  Reports she has been feeling generally unwell for the last 2-3 days including headache, myalgias, chills.  Reports she had several episodes of mild diarrhea this morning.  No melena or hematochezia.  No nausea or vomiting.  Patient reports she is fully vaccinated for COVID including a booster.  No known sick contacts.  Patient reports she has a history of hospitalization and intubation for her asthma however this has been a greater than 2 years.  States she has not been on steroids for her asthma and almost as long.  She has used her albuterol at home without relief.  No other aggravating or alleviating factors.  The history is provided by the patient and medical records. No language interpreter was used.      Past Medical History:  Diagnosis Date   Asthma    GERD (gastroesophageal reflux disease)    Glands swollen    Per client, in neck   History of anemia    History of blood transfusion    History of breast lump    History of shortness of breath    History of TMJ disorder    History of weight gain    Hypertension    Plantar fasciitis, bilateral    Polysubstance abuse (HCC)    repeatedly positive for cocaine, THC   Ulcer     Patient Active Problem List   Diagnosis Date Noted   Drug overdose, accidental or unintentional, initial encounter 07/14/2018   Hematemesis 07/14/2018   Elevated lactic acid level 07/14/2018   Breast mass in female 07/14/2018   Hyperglycemia 07/14/2018   Essential hypertension 07/14/2018   Polysubstance abuse (HCC) 07/14/2018    Past Surgical History:  Procedure Laterality Date   history of tooth extraction     12 teeth removed under general anesthesia in 2020.   infertility        OB History     Gravida  0   Para  0   Term  0   Preterm  0   AB  0   Living  0      SAB  0   IAB  0   Ectopic  0   Multiple  0   Live Births  0           Family History  Problem Relation Age of Onset   Hypertension Paternal Grandmother    Hypertension Father    Diabetes Father    Kidney failure Father    Hypertension Mother    ADD / ADHD Sister     Social History   Tobacco Use   Smoking status: Every Day    Packs/day: 1.00    Years: 12.00    Pack years: 12.00    Types: Cigarettes   Smokeless tobacco: Never  Vaping Use   Vaping Use: Never used  Substance Use Topics   Alcohol use: Yes    Comment: Reports last ETOH use 02/03/2020   Drug use: Yes    Types: Cocaine, Marijuana    Comment: Reports last marijuana 10/2019, last powder cocaine use 09/2019, hx of 07/2018 overdose with "heroin pills" per client.    Home Medications Prior to Admission medications   Medication Sig Start Date  End Date Taking? Authorizing Provider  albuterol (VENTOLIN HFA) 108 (90 Base) MCG/ACT inhaler Inhale 2 puffs into the lungs every 6 (six) hours as needed for wheezing or shortness of breath. 07/15/18   Joseph Art, DO  omeprazole (PRILOSEC) 20 MG capsule Take 1 capsule (20 mg total) by mouth daily. Patient not taking: Reported on 03/01/2020 07/15/18   Joseph Art, DO    Allergies    Patient has no known allergies.  Review of Systems   Review of Systems  Constitutional:  Positive for chills and fatigue. Negative for appetite change, diaphoresis, fever and unexpected weight change.  HENT:  Negative for mouth sores.   Eyes:  Negative for visual disturbance.  Respiratory:  Positive for cough, chest tightness, shortness of breath and wheezing.   Cardiovascular:  Negative for chest pain.  Gastrointestinal:  Positive for diarrhea. Negative for abdominal pain, constipation, nausea and vomiting.  Endocrine: Negative for polydipsia, polyphagia and polyuria.   Genitourinary:  Negative for dysuria, frequency, hematuria and urgency.  Musculoskeletal:  Positive for myalgias. Negative for back pain and neck stiffness.  Skin:  Negative for rash.  Allergic/Immunologic: Negative for immunocompromised state.  Neurological:  Negative for syncope, light-headedness and headaches.  Hematological:  Does not bruise/bleed easily.  Psychiatric/Behavioral:  Negative for sleep disturbance. The patient is not nervous/anxious.    Physical Exam Updated Vital Signs BP (!) 158/92 (BP Location: Right Arm)   Pulse 88   Temp 98.5 F (36.9 C) (Oral)   Resp 20   Ht 4\' 9"  (1.448 m)   Wt 90.7 kg   SpO2 98%   BMI 43.28 kg/m   Physical Exam Vitals and nursing note reviewed.  Constitutional:      General: She is not in acute distress.    Appearance: She is not diaphoretic.  HENT:     Head: Normocephalic.  Eyes:     General: No scleral icterus.    Conjunctiva/sclera: Conjunctivae normal.  Cardiovascular:     Rate and Rhythm: Normal rate and regular rhythm.     Pulses: Normal pulses.          Radial pulses are 2+ on the right side and 2+ on the left side.  Pulmonary:     Effort: Tachypnea, accessory muscle usage and prolonged expiration present. No respiratory distress or retractions.     Breath sounds: No stridor. Wheezing (Inspiratory and expiratory wheezes throughout) present.     Comments: Equal chest rise. Moderate increased work of breathing. No hypoxia Abdominal:     General: There is no distension.     Palpations: Abdomen is soft.     Tenderness: There is no abdominal tenderness. There is no guarding or rebound.  Musculoskeletal:     Cervical back: Normal range of motion.     Comments: Moves all extremities equally and without difficulty.  Skin:    General: Skin is warm and dry.     Capillary Refill: Capillary refill takes less than 2 seconds.  Neurological:     Mental Status: She is alert.     GCS: GCS eye subscore is 4. GCS verbal subscore is  5. GCS motor subscore is 6.     Comments: Speech is clear and goal oriented.  Psychiatric:        Mood and Affect: Mood normal.    ED Results / Procedures / Treatments   Labs (all labs ordered are listed, but only abnormal results are displayed) Labs Reviewed  CBC - Abnormal; Notable for the  following components:      Result Value   Hemoglobin 11.8 (*)    All other components within normal limits  BASIC METABOLIC PANEL - Abnormal; Notable for the following components:   Glucose, Bld 113 (*)    All other components within normal limits  RESP PANEL BY RT-PCR (FLU A&B, COVID) [id number]    Radiology DG Chest Portable 1 View  Result Date: 10/11/2020 CLINICAL DATA:  Chest pain and shortness of breath. EXAM: PORTABLE CHEST 1 VIEW COMPARISON:  Chest radiograph dated 03/20/2020. FINDINGS: No focal consolidation, pleural effusion pneumothorax. The cardiac silhouette is within normal limits. No acute osseous pathology. IMPRESSION: No active cardiopulmonary disease. Electronically Signed   By: Elgie Collard M.D.   On: 10/11/2020 02:33    Procedures .Critical Care  Date/Time: 10/11/2020 5:05 AM Performed by: Dierdre Forth, PA-C Authorized by: Dierdre Forth, PA-C   Critical care provider statement:    Critical care time (minutes):  45   Critical care time was exclusive of:  Separately billable procedures and treating other patients and teaching time   Critical care was necessary to treat or prevent imminent or life-threatening deterioration of the following conditions:  Respiratory failure   Critical care was time spent personally by me on the following activities:  Discussions with consultants, evaluation of patient's response to treatment, examination of patient, ordering and performing treatments and interventions, ordering and review of laboratory studies, ordering and review of radiographic studies, pulse oximetry, re-evaluation of patient's condition, obtaining history from  patient or surrogate and review of old charts   I assumed direction of critical care for this patient from another provider in my specialty: no     Care discussed with: admitting provider     Medications Ordered in ED Medications  magnesium sulfate IVPB 2 g 50 mL (2 g Intravenous New Bag/Given 10/11/20 0441)  albuterol (PROVENTIL,VENTOLIN) solution continuous neb (10 mg/hr Nebulization New Bag/Given 10/11/20 0434)  ipratropium-albuterol (DUONEB) 0.5-2.5 (3) MG/3ML nebulizer solution 3 mL (3 mLs Nebulization Given 10/11/20 0221)  albuterol (PROVENTIL) (2.5 MG/3ML) 0.083% nebulizer solution 2.5 mg (2.5 mg Nebulization Given 10/11/20 0222)  methylPREDNISolone sodium succinate (SOLU-MEDROL) 125 mg/2 mL injection 125 mg (125 mg Intravenous Given 10/11/20 0255)  albuterol (PROVENTIL) (2.5 MG/3ML) 0.083% nebulizer solution 5 mg (5 mg Nebulization Given 10/11/20 0311)  ipratropium (ATROVENT) nebulizer solution 0.5 mg (0.5 mg Nebulization Given 10/11/20 0311)  ibuprofen (ADVIL) tablet 600 mg (600 mg Oral Given 10/11/20 9833)    ED Course  I have reviewed the triage vital signs and the nursing notes.  Pertinent labs & imaging results that were available during my care of the patient were reviewed by me and considered in my medical decision making (see chart for details).  Clinical Course as of 10/11/20 0505  Fri Oct 11, 2020  0301 Pt still wheezing [HM]  0422 Patient hypoxic at 88% on room air.  Continues to have inspiratory and expiratory wheezes.  Will give magnesium, continuous nebulizer and plan for admission. [HM]    Clinical Course User Index [HM] Ronie Fleeger, Boyd Kerbs   MDM Rules/Calculators/A&P                           Patient presents to the emergency department with URI symptoms and asthma exacerbation.  Inspiratory and expiratory wheezing on exam.  No hypoxia.  Will test for COVID, obtain chest x-ray and treat for asthma exacerbation.  5:04 AM Patient without significant improvement.  On  recheck she was found to be 88% on room air at rest but not sleeping.  Patient placed on 2 L via nasal cannula.  She continues to have inspiratory and x-ray wheezing.  Continuous nebulizer ordered along with magnesium.  She does not need BiPAP or intubation at this time.  Discussed with Dr. Garner Nash who will admit to [facility].  Patient in agreement with the plan.   Final Clinical Impression(s) / ED Diagnoses Final diagnoses:  Moderate persistent asthma with exacerbation    Rx / DC Orders ED Discharge Orders     None        Lynde Ludwig, Boyd Kerbs 10/11/20 0505    Lachanda Buczek, Dahlia Client, PA-C 10/11/20 0505    Sabas Sous, MD 10/11/20 [phone]

## 2020-10-11 NOTE — Discharge Summary (Signed)
Physician Discharge Summary  Barbara Bruce:[id number] DOB: 10/14/1985 DOA: 10/11/2020  PCP: Patient, No Pcp Per (Inactive)  Admit date: 10/11/2020 Discharge date: 10/11/2020  Admitted From: Home Disposition: Home  Recommendations for Outpatient Follow-up:  Follow up with PCP in 1-2 weeks   Home Health: Not applicable Equipment/Devices: Not applicable  Discharge Condition: Stable CODE STATUS: Full code Diet recommendation: Regular diet  Discharge summary: Patient with history of asthma mild intermittent, ongoing smoker and GERD came to emergency room last night with cough and wheezing.  No fever.  Stable in the emergency room.  Apparently noted to have saturation of 88% on sleeping so admitted by early morning provider.  She was still in the emergency room.  Patient was treated with IV steroids and bronchodilator therapy for presumed acute asthma exacerbation.  Patient is stabilized.  Went to examine her in the emergency room, she was walking around on room air with some dry cough but no wheezing or shortness of breath.  Chest x-ray and other investigations were reviewed they are essentially normal.  COVID-19 and influenza swab was negative.  With improvement of symptoms, she is able to go home.  We will treat as acute wheezy bronchitis with short course of prednisone 40 mg daily for 5 days, she does not usually use bronchodilators at home.  Will prescribe albuterol inhaler.  She can use over-the-counter cough medications.  Patient does not have any evidence of chronic persistent asthma, currently no indication for steroid inhalers.  Smoking cessation counseling done, patient is motivated.  Nicotine patch prescribed.     Discharge Diagnoses:  Principal Problem:   Asthma exacerbation Active Problems:   Chronic anemia   GERD (gastroesophageal reflux disease)   Tobacco abuse disorder   Acute respiratory failure with hypoxia [facility])    Discharge Instructions  Discharge Instructions      Call MD for:  difficulty breathing, headache or visual disturbances   Complete by: As directed    Diet - low sodium heart healthy   Complete by: As directed    Discharge instructions   Complete by: As directed    Can use over the counter cough medicine and tylenol   Increase activity slowly   Complete by: As directed       Allergies as of 10/11/2020   No Known Allergies      Medication List     STOP taking these medications    omeprazole 20 MG capsule Commonly known as: PRILOSEC       TAKE these medications    albuterol 108 (90 Base) MCG/ACT inhaler Commonly known as: VENTOLIN HFA Inhale 2 puffs into the lungs every 4 (four) hours as needed for wheezing or shortness of breath.   DAYQUIL MULTI-SYMPTOM PO Take 15 mLs by mouth every 6 (six) hours as needed (cold symptoms).   ibuprofen 200 MG tablet Commonly known as: ADVIL Take 400 mg by mouth every 6 (six) hours as needed for mild pain.   MUCINEX FAST-MAX CLD/FLU DY/NT PO Take 2 tablets by mouth every 6 (six) hours as needed (cough).   nicotine 21 mg/24hr patch Commonly known as: NICODERM CQ - dosed in mg/24 hours Place 1 patch (21 mg total) onto the skin daily for 14 days. Start taking on: October 12, 2020   predniSONE 20 MG tablet Commonly known as: DELTASONE Take 2 tablets (40 mg total) by mouth daily with breakfast for 5 days. Start taking on: October 12, 2020        No Known  Allergies  Consultations: None   Procedures/Studies: DG Chest Portable 1 View  Result Date: 10/11/2020 CLINICAL DATA:  Chest pain and shortness of breath. EXAM: PORTABLE CHEST 1 VIEW COMPARISON:  Chest radiograph dated 03/20/2020. FINDINGS: No focal consolidation, pleural effusion pneumothorax. The cardiac silhouette is within normal limits. No acute osseous pathology. IMPRESSION: No active cardiopulmonary disease. Electronically Signed   By: Elgie Collard M.D.   On: 10/11/2020 02:33   (Echo, Carotid, EGD, Colonoscopy,  ERCP)    Subjective: Patient seen and examined.  She was in the emergency room waiting for inpatient bed.  Her husband and her son were at the bedside.  Patient was awake and watching TV and wondering about going home.  She was on room air.  We discussed about symptom management at home and patient is eager to go home.    Discharge Exam: Vitals:   10/11/20 0724 10/11/20 0845  BP:  136/87  Pulse:  99  Resp:  (!) 21  Temp:    SpO2: 96% 96%   Vitals:   10/11/20 0633 10/11/20 0656 10/11/20 0724 10/11/20 0845  BP:    136/87  Pulse: 88 (!) 102  99  Resp:    (!) 21  Temp:      TempSrc:      SpO2: 93%  96% 96%  Weight:      Height:        General: Pt is alert, awake, not in acute distress Cardiovascular: RRR, S1/S2 +, no rubs, no gallops Respiratory: CTA bilaterally, no wheezing, no rhonchi, does have some upper airway conducted sounds.  No wheezing or crepitations. Abdominal: Soft, NT, ND, bowel sounds + Extremities: no edema, no cyanosis    The results of significant diagnostics from this hospitalization (including imaging, microbiology, ancillary and laboratory) are listed below for reference.     Microbiology: Recent Results (from the past 240 hour(s))  Resp Panel by RT-PCR (Flu A&B, Covid) Nasopharyngeal Swab     Status: None   Collection Time: 10/11/20  2:01 AM   Specimen: Nasopharyngeal Swab; Nasopharyngeal(NP) swabs in vial transport medium  Result Value Ref Range Status   SARS Coronavirus 2 by RT PCR NEGATIVE NEGATIVE Final    Comment: (NOTE) SARS-CoV-2 target nucleic acids are NOT DETECTED.  The SARS-CoV-2 RNA is generally detectable in upper respiratory specimens during the acute phase of infection. The lowest concentration of SARS-CoV-2 viral copies this assay can detect is 138 copies/mL. A negative result does not preclude SARS-Cov-2 infection and should not be used as the sole basis for treatment or other patient management decisions. A negative result may  occur with  improper specimen collection/handling, submission of specimen other than nasopharyngeal swab, presence of viral mutation(s) within the areas targeted by this assay, and inadequate number of viral copies(<138 copies/mL). A negative result must be combined with clinical observations, patient history, and epidemiological information. The expected result is Negative.  Fact Sheet for Patients:  BloggerCourse.com  Fact Sheet for Healthcare Providers:  SeriousBroker.it  This test is no t yet approved or cleared by the [place] FDA and  has been authorized for detection and/or diagnosis of SARS-CoV-2 by FDA under an Emergency Use Authorization (EUA). This EUA will remain  in effect (meaning this test can be used) for the duration of the COVID-19 declaration under Section 564(b)(1) of the Act, 21 U.S.C.section 360bbb-3(b)(1), unless the authorization is terminated  or revoked sooner.       Influenza A by PCR NEGATIVE NEGATIVE Final  Influenza B by PCR NEGATIVE NEGATIVE Final    Comment: (NOTE) The Xpert Xpress SARS-CoV-2/FLU/RSV plus assay is intended as an aid in the diagnosis of influenza from Nasopharyngeal swab specimens and should not be used as a sole basis for treatment. Nasal washings and aspirates are unacceptable for Xpert Xpress SARS-CoV-2/FLU/RSV testing.  Fact Sheet for Patients: BloggerCourse.com  Fact Sheet for Healthcare Providers: SeriousBroker.it  This test is not yet approved or cleared by the [place] FDA and has been authorized for detection and/or diagnosis of SARS-CoV-2 by FDA under an Emergency Use Authorization (EUA). This EUA will remain in effect (meaning this test can be used) for the duration of the COVID-19 declaration under Section 564(b)(1) of the Act, 21 U.S.C. section 360bbb-3(b)(1), unless the authorization is terminated  or revoked.  Performed at [facility], [address] [place], [place] [address]   Respiratory (~20 pathogens) panel by PCR     Status: Abnormal   Collection Time: 10/11/20  5:51 AM   Specimen: Nasopharyngeal Swab; Respiratory  Result Value Ref Range Status   Adenovirus NOT DETECTED NOT DETECTED Final   Coronavirus 229E NOT DETECTED NOT DETECTED Final    Comment: (NOTE) The Coronavirus on the Respiratory Panel, DOES NOT test for the novel  Coronavirus (2019 nCoV)    Coronavirus HKU1 NOT DETECTED NOT DETECTED Final   Coronavirus NL63 NOT DETECTED NOT DETECTED Final   Coronavirus OC43 NOT DETECTED NOT DETECTED Final   Metapneumovirus DETECTED (A) NOT DETECTED Final   Rhinovirus / Enterovirus NOT DETECTED NOT DETECTED Final   Influenza A NOT DETECTED NOT DETECTED Final   Influenza B NOT DETECTED NOT DETECTED Final   Parainfluenza Virus 1 NOT DETECTED NOT DETECTED Final   Parainfluenza Virus 2 NOT DETECTED NOT DETECTED Final   Parainfluenza Virus 3 NOT DETECTED NOT DETECTED Final   Parainfluenza Virus 4 NOT DETECTED NOT DETECTED Final   Respiratory Syncytial Virus NOT DETECTED NOT DETECTED Final   Bordetella pertussis NOT DETECTED NOT DETECTED Final   Bordetella Parapertussis NOT DETECTED NOT DETECTED Final   Chlamydophila pneumoniae NOT DETECTED NOT DETECTED Final   Mycoplasma pneumoniae NOT DETECTED NOT DETECTED Final    Comment: Performed at [facility], [address] [place], [place] [address]     Labs: BNP (last 3 results) No results for input(s): BNP in the last 8760 hours. Basic Metabolic Panel: Recent Labs  Lab 10/11/20 0248  NA 138  K 3.7  CL 100  CO2 30  GLUCOSE 113*  BUN 8  CREATININE 0.59  CALCIUM 9.0   Liver Function Tests: No results for input(s): AST, ALT, ALKPHOS, BILITOT, PROT, ALBUMIN in the last 168 hours. No results for input(s): LIPASE, AMYLASE in the last 168 hours. No results for input(s): AMMONIA in the last 168  hours. CBC: Recent Labs  Lab 10/11/20 0248  WBC 9.2  HGB 11.8*  HCT 38.2  MCV 87.4  PLT 262   Cardiac Enzymes: No results for input(s): CKTOTAL, CKMB, CKMBINDEX, TROPONINI in the last 168 hours. BNP: Invalid input(s): POCBNP CBG: No results for input(s): GLUCAP in the last 168 hours. D-Dimer No results for input(s): DDIMER in the last 72 hours. Hgb A1c No results for input(s): HGBA1C in the last 72 hours. Lipid Profile No results for input(s): CHOL, HDL, LDLCALC, TRIG, CHOLHDL, LDLDIRECT in the last 72 hours. Thyroid function studies No results for input(s): TSH, T4TOTAL, T3FREE, THYROIDAB in the last 72 hours.  Invalid input(s): FREET3 Anemia work up  No results for input(s): VITAMINB12, FOLATE, FERRITIN, TIBC, IRON, RETICCTPCT in the last 72 hours. Urinalysis    Component Value Date/Time   COLORURINE YELLOW (A) 03/20/2020 0539   APPEARANCEUR HAZY (A) 03/20/2020 0539   LABSPEC 1.015 03/20/2020 0539   PHURINE 6.0 03/20/2020 0539   GLUCOSEU NEGATIVE 03/20/2020 0539   HGBUR NEGATIVE 03/20/2020 0539   BILIRUBINUR NEGATIVE 03/20/2020 0539   KETONESUR NEGATIVE 03/20/2020 0539   PROTEINUR NEGATIVE 03/20/2020 0539   UROBILINOGEN 0.2 10/21/2014 1604   NITRITE NEGATIVE 03/20/2020 0539   LEUKOCYTESUR SMALL (A) 03/20/2020 0539   Sepsis Labs Invalid input(s): PROCALCITONIN,  WBC,  LACTICIDVEN Microbiology Recent Results (from the past 240 hour(s))  Resp Panel by RT-PCR (Flu A&B, Covid) Nasopharyngeal Swab     Status: None   Collection Time: 10/11/20  2:01 AM   Specimen: Nasopharyngeal Swab; Nasopharyngeal(NP) swabs in vial transport medium  Result Value Ref Range Status   SARS Coronavirus 2 by RT PCR NEGATIVE NEGATIVE Final    Comment: (NOTE) SARS-CoV-2 target nucleic acids are NOT DETECTED.  The SARS-CoV-2 RNA is generally detectable in upper respiratory specimens during the acute phase of infection. The lowest concentration of SARS-CoV-2 viral copies this assay can  detect is 138 copies/mL. A negative result does not preclude SARS-Cov-2 infection and should not be used as the sole basis for treatment or other patient management decisions. A negative result may occur with  improper specimen collection/handling, submission of specimen other than nasopharyngeal swab, presence of viral mutation(s) within the areas targeted by this assay, and inadequate number of viral copies(<138 copies/mL). A negative result must be combined with clinical observations, patient history, and epidemiological information. The expected result is Negative.  Fact Sheet for Patients:  BloggerCourse.comhttps://www.fda.gov/media/152166/download  Fact Sheet for Healthcare Providers:  SeriousBroker.ithttps://www.fda.gov/media/152162/download  This test is no t yet approved or cleared by the [place] FDA and  has been authorized for detection and/or diagnosis of SARS-CoV-2 by FDA under an Emergency Use Authorization (EUA). This EUA will remain  in effect (meaning this test can be used) for the duration of the COVID-19 declaration under Section 564(b)(1) of the Act, 21 U.S.C.section 360bbb-3(b)(1), unless the authorization is terminated  or revoked sooner.       Influenza A by PCR NEGATIVE NEGATIVE Final   Influenza B by PCR NEGATIVE NEGATIVE Final    Comment: (NOTE) The Xpert Xpress SARS-CoV-2/FLU/RSV plus assay is intended as an aid in the diagnosis of influenza from Nasopharyngeal swab specimens and should not be used as a sole basis for treatment. Nasal washings and aspirates are unacceptable for Xpert Xpress SARS-CoV-2/FLU/RSV testing.  Fact Sheet for Patients: BloggerCourse.comhttps://www.fda.gov/media/152166/download  Fact Sheet for Healthcare Providers: SeriousBroker.ithttps://www.fda.gov/media/152162/download  This test is not yet approved or cleared by the [place] FDA and has been authorized for detection and/or diagnosis of SARS-CoV-2 by FDA under an Emergency Use Authorization (EUA). This EUA will remain in  effect (meaning this test can be used) for the duration of the COVID-19 declaration under Section 564(b)(1) of the Act, 21 U.S.C. section 360bbb-3(b)(1), unless the authorization is terminated or revoked.  Performed at [facility], [address] [place], [place] [id number]   Respiratory (~20 pathogens) panel by PCR     Status: Abnormal   Collection Time: 10/11/20  5:51 AM   Specimen: Nasopharyngeal Swab; Respiratory  Result Value Ref Range Status   Adenovirus NOT DETECTED NOT DETECTED Final   Coronavirus 229E NOT DETECTED NOT DETECTED Final    Comment: (NOTE) The Coronavirus  on the Respiratory Panel, DOES NOT test for the novel  Coronavirus (2019 nCoV)    Coronavirus HKU1 NOT DETECTED NOT DETECTED Final   Coronavirus NL63 NOT DETECTED NOT DETECTED Final   Coronavirus OC43 NOT DETECTED NOT DETECTED Final   Metapneumovirus DETECTED (A) NOT DETECTED Final   Rhinovirus / Enterovirus NOT DETECTED NOT DETECTED Final   Influenza A NOT DETECTED NOT DETECTED Final   Influenza B NOT DETECTED NOT DETECTED Final   Parainfluenza Virus 1 NOT DETECTED NOT DETECTED Final   Parainfluenza Virus 2 NOT DETECTED NOT DETECTED Final   Parainfluenza Virus 3 NOT DETECTED NOT DETECTED Final   Parainfluenza Virus 4 NOT DETECTED NOT DETECTED Final   Respiratory Syncytial Virus NOT DETECTED NOT DETECTED Final   Bordetella pertussis NOT DETECTED NOT DETECTED Final   Bordetella Parapertussis NOT DETECTED NOT DETECTED Final   Chlamydophila pneumoniae NOT DETECTED NOT DETECTED Final   Mycoplasma pneumoniae NOT DETECTED NOT DETECTED Final    Comment: Performed at [facility], [address] [place], [place] [address]     Time coordinating discharge: 25 minutes  SIGNED:   Dorcas Carrow, MD  [facility] 10/11/2020, 3:10 PM

## 2020-10-11 NOTE — ED Notes (Signed)
Pt provided breakfast tray.

## 2020-10-11 NOTE — ED Notes (Signed)
D/c paperwork reviewed with pt, including prescriptions.  Pt with no questions or concerns at time of d/c. Family at bedside, pt ambulatory to ED exit on RA.

## 2020-10-11 NOTE — ED Triage Notes (Signed)
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 When patient attempted to go to work, patient was turned away stating that there was a positive COVID case that she came into contact with. Patient has wheezing, cough, shortness of breath.

## 2020-12-19 ENCOUNTER — Other Ambulatory Visit: Payer: Self-pay

## 2020-12-19 ENCOUNTER — Emergency Department (HOSPITAL_COMMUNITY): Payer: Medicaid Other

## 2020-12-19 ENCOUNTER — Emergency Department (HOSPITAL_COMMUNITY)
Admission: EM | Admit: 2020-12-19 | Discharge: 2020-12-19 | Disposition: A | Discharge status: Home / Self Care | Payer: Medicaid Other | Attending: Emergency Medicine | Admitting: Emergency Medicine

## 2020-12-19 ENCOUNTER — Encounter (HOSPITAL_COMMUNITY): Payer: Self-pay

## 2020-12-19 DIAGNOSIS — I10 Essential (primary) hypertension: Secondary | ICD-10-CM | POA: Insufficient documentation

## 2020-12-19 DIAGNOSIS — R0602 Shortness of breath: Secondary | ICD-10-CM | POA: Diagnosis present

## 2020-12-19 DIAGNOSIS — J4541 Moderate persistent asthma with (acute) exacerbation: Secondary | ICD-10-CM | POA: Insufficient documentation

## 2020-12-19 DIAGNOSIS — F17210 Nicotine dependence, cigarettes, uncomplicated: Secondary | ICD-10-CM | POA: Diagnosis not present

## 2020-12-19 MED ORDER — ACETAMINOPHEN 500 MG PO TABS
1000.0000 mg | ORAL_TABLET | Freq: Once | ORAL | Status: AC
  Administered 2020-12-19: 1000 mg via ORAL
  Filled 2020-12-19: qty 2

## 2020-12-19 MED ORDER — PREDNISONE 20 MG PO TABS
60.0000 mg | ORAL_TABLET | Freq: Once | ORAL | Status: AC
  Administered 2020-12-19: 60 mg via ORAL
  Filled 2020-12-19: qty 3

## 2020-12-19 MED ORDER — ALBUTEROL SULFATE HFA 108 (90 BASE) MCG/ACT IN AERS
6.0000 | INHALATION_SPRAY | Freq: Once | RESPIRATORY_TRACT | Status: AC
  Administered 2020-12-19: 6 via RESPIRATORY_TRACT
  Filled 2020-12-19: qty 6.7

## 2020-12-19 MED ORDER — IPRATROPIUM-ALBUTEROL 0.5-2.5 (3) MG/3ML IN SOLN
3.0000 mL | RESPIRATORY_TRACT | Status: AC
  Administered 2020-12-19: 3 mL via RESPIRATORY_TRACT
  Filled 2020-12-19: qty 3

## 2020-12-19 MED ORDER — PREDNISONE 10 MG PO TABS: 40.0000 mg | ORAL_TABLET | Freq: Every day | ORAL | 0 refills | Status: AC

## 2020-12-19 NOTE — ED Provider Notes (Signed)
[facility]-EMERGENCY DEPT Provider Note  CSN: [id number] Arrival date & time: 12/19/20 0126  Chief Complaint(s) Shortness of Breath  HPI Barbara Bruce is a 35 y.o. female with a past medical history listed below including asthma who presents to the emergency department with several days of gradually worsening shortness of breath and dry cough.  Patient reports that she has been taking puffs of albuterol with some relief but ran out of her inhaler.  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  She denies any fevers.  She does endorse nasal congestion.  No chest pain.  No lower extremity edema.  No other physical complaints.  HPI  Past Medical History Past Medical History:  Diagnosis Date   Asthma    GERD (gastroesophageal reflux disease)    Glands swollen    Per client, in neck   History of anemia    History of blood transfusion    History of breast lump    History of shortness of breath    History of TMJ disorder    History of weight gain    Hypertension    Plantar fasciitis, bilateral    Polysubstance abuse (HCC)    repeatedly positive for cocaine, THC   Ulcer    Patient Active Problem List   Diagnosis Date Noted   Asthma exacerbation 10/11/2020   Chronic anemia 10/11/2020   GERD (gastroesophageal reflux disease) 10/11/2020   Tobacco abuse disorder 10/11/2020   Acute respiratory failure with hypoxia (HCC) 10/11/2020   Drug overdose, accidental or unintentional, initial encounter 07/14/2018   Hematemesis 07/14/2018   Elevated lactic acid level 07/14/2018   Breast mass in female 07/14/2018   Hyperglycemia 07/14/2018   Essential hypertension 07/14/2018   Polysubstance abuse (HCC) 07/14/2018   Home Medication(s) Prior to Admission medications   Medication Sig Start Date End Date Taking? Authorizing Provider  albuterol (VENTOLIN HFA) 108 (90 Base) MCG/ACT inhaler Inhale 2 puffs into  the lungs every 4 (four) hours as needed for wheezing or shortness of breath. 10/11/20  Yes Dorcas Carrow, MD  ibuprofen (ADVIL) 200 MG tablet Take 400 mg by mouth every 6 (six) hours as needed for mild pain.   Yes [provider]  PE-diphenhydrAMINE-DM-GG-APAP (MUCINEX FAST-MAX CLD/FLU DY/NT PO) Take 2 tablets by mouth every 6 (six) hours as needed (cough).   Yes [provider]  predniSONE (DELTASONE) 10 MG tablet Take 4 tablets (40 mg total) by mouth daily for 4 days. 12/19/20 12/23/20 Yes Skyler Carel, Amadeo Garnet, MD  Pseudoephedrine-APAP-DM (DAYQUIL MULTI-SYMPTOM PO) Take 15 mLs by mouth every 6 (six) hours as needed (cold symptoms).   Yes [provider]                                                                                                                                    Past  Surgical History Past Surgical History:  Procedure Laterality Date   history of tooth extraction     12 teeth removed under general anesthesia in 2020.   infertility     Family History Family History  Problem Relation Age of Onset   Hypertension Paternal Grandmother    Hypertension Father    Diabetes Father    Kidney failure Father    Hypertension Mother    ADD / ADHD Sister     Social History Social History   Tobacco Use   Smoking status: Every Day    Packs/day: 1.00    Years: 12.00    Pack years: 12.00    Types: Cigarettes   Smokeless tobacco: Never  Vaping Use   Vaping Use: Never used  Substance Use Topics   Alcohol use: Yes    Comment: Reports last ETOH use 02/03/2020   Drug use: Yes    Types: Cocaine, Marijuana    Comment: Reports last marijuana 10/2019, last powder cocaine use 09/2019, hx of 07/2018 overdose with "heroin pills" per client.   Allergies Patient has no known allergies.  Review of Systems Review of Systems All other systems are reviewed and are negative for acute change except as noted in the HPI  Physical Exam Vital Signs  I have  reviewed the triage vital signs BP (!) 146/92   Pulse 90   Temp 98.2 F (36.8 C) (Oral)   Resp (!) 22   Ht 4\' 9"  (1.448 m)   Wt 95.3 kg   LMP 12/05/2020   SpO2 97%   BMI 45.44 kg/m   Physical Exam Vitals reviewed.  Constitutional:      General: She is not in acute distress.    Appearance: She is well-developed. She is not diaphoretic.  HENT:     Head: Normocephalic and atraumatic.     Nose: Congestion present.     Mouth/Throat:     Pharynx: No posterior oropharyngeal erythema.     Tonsils: No tonsillar exudate.     Comments: Post nasal drip  Eyes:     General: No scleral icterus.       Right eye: No discharge.        Left eye: No discharge.     Conjunctiva/sclera: Conjunctivae normal.     Pupils: Pupils are equal, round, and reactive to light.  Cardiovascular:     Rate and Rhythm: Normal rate and regular rhythm.     Heart sounds: No murmur heard.   No friction rub. No gallop.  Pulmonary:     Effort: Pulmonary effort is normal. No respiratory distress.     Breath sounds: No stridor. Wheezing (faint) present. No rales.  Abdominal:     General: There is no distension.     Palpations: Abdomen is soft.     Tenderness: There is no abdominal tenderness.  Musculoskeletal:        General: No tenderness.     Cervical back: Normal range of motion and neck supple.  Skin:    General: Skin is warm and dry.     Findings: No erythema or rash.  Neurological:     Mental Status: She is alert and oriented to person, place, and time.    ED Results and Treatments Labs (all labs ordered are listed, but only abnormal results are displayed) Labs Reviewed - No data to display  EKG  EKG Interpretation  Date/Time:    Ventricular Rate:    PR Interval:    QRS Duration:   QT Interval:    QTC Calculation:   R Axis:     Text Interpretation:         Radiology DG  Chest 2 View  Result Date: 12/19/2020 CLINICAL DATA:  Shortness of breath, wheezing, mild chest pain. EXAM: CHEST - 2 VIEW COMPARISON:  10/11/2020. FINDINGS: The heart is normal in size and the mediastinal structures are within normal limits. No consolidation, effusion, or pneumothorax. No acute osseous abnormality. IMPRESSION: No acute cardiopulmonary process. Electronically Signed   By: Thornell Sartorius M.D.   On: 12/19/2020 01:57    Pertinent labs & imaging results that were available during my care of the patient were reviewed by me and considered in my medical decision making (see MDM for details).  Medications Ordered in ED Medications  ipratropium-albuterol (DUONEB) 0.5-2.5 (3) MG/3ML nebulizer solution 3 mL (3 mLs Nebulization Given 12/19/20 0536)  acetaminophen (TYLENOL) tablet 1,000 mg (has no administration in time range)  albuterol (VENTOLIN HFA) 108 (90 Base) MCG/ACT inhaler 6 puff (6 puffs Inhalation Given 12/19/20 0500)  predniSONE (DELTASONE) tablet 60 mg (60 mg Oral Given 12/19/20 0533)                                                                                                                                     Procedures .Critical Care Performed by: Nira Conn, MD Authorized by: Nira Conn, MD   Critical care provider statement:    Critical care time (minutes):  45   Critical care time was exclusive of:  Separately billable procedures and treating other patients   Critical care was necessary to treat or prevent imminent or life-threatening deterioration of the following conditions:  Respiratory failure   Critical care was time spent personally by me on the following activities:  Development of treatment plan with patient or surrogate, discussions with consultants, evaluation of patient's response to treatment, examination of patient, obtaining history from patient or surrogate, review of old charts, re-evaluation of patient's condition, pulse  oximetry, ordering and review of radiographic studies, ordering and review of laboratory studies and ordering and performing treatments and interventions   Care discussed with: admitting provider    (including critical care time)  Medical Decision Making / ED Course I have reviewed the nursing notes for this encounter and the patient's prior records (if available in EHR or on provided paperwork).  Barbara Bruce was evaluated in Emergency Department on 12/19/2020 for the symptoms described in the history of present illness. She was evaluated in the context of the global COVID-19 pandemic, which necessitated consideration that the patient might be at risk for infection with the SARS-CoV-2 virus that causes COVID-19. Institutional protocols and algorithms that pertain to the evaluation of patients at risk for COVID-19 are in a state of rapid change  based on information released by regulatory bodies including the CDC and federal and state organizations. These policies and algorithms were followed during the patient's care in the ED.     Patient here with shortness of breath consistent with moderate asthma exacerbation. No focal rales noted on exam pneumonia. Chest x-ray without evidence of pneumonia or pneumothorax.  Patient does have evidence of upper respiratory infection. She is satting well on room air. Increased work of breathing  Improved with high-dose albuterol puffs and duo nebs.  Oral steroids given.  Pertinent labs & imaging results that were available during my care of the patient were reviewed by me and considered in my medical decision making:    Final Clinical Impression(s) / ED Diagnoses Final diagnoses:  Moderate persistent asthma with exacerbation   The patient appears reasonably screened and/or stabilized for discharge and I doubt any other medical condition or other [facility] requiring further screening, evaluation, or treatment in the ED at this time prior to discharge. Safe  for discharge with strict return precautions.  Disposition: Discharge  Condition: Good  I have discussed the results, Dx and Tx plan with the patient/family who expressed understanding and agree(s) with the plan. Discharge instructions discussed at length. The patient/family was given strict return precautions who verbalized understanding of the instructions. No further questions at time of discharge.    ED Discharge Orders          Ordered    predniSONE (DELTASONE) 10 MG tablet  Daily        12/19/20 0647              Follow Up: Primary care provider  Call  to schedule an appointment for close follow up     This chart was dictated using voice recognition software.  Despite best efforts to proofread,  errors can occur which can change the documentation meaning.    Nira Conn, MD 12/19/20 [phone]

## 2020-12-19 NOTE — ED Triage Notes (Signed)
Pt reports shob x 2 days and a "lump" on LUQ of abdomen that swells when taking a deep breath. 2 puffs albuterol taken pta.

## 2020-12-19 NOTE — ED Notes (Signed)
Patient transported to X-ray 

## 2021-05-13 ENCOUNTER — Emergency Department (HOSPITAL_COMMUNITY)
Admission: EM | Admit: 2021-05-13 | Discharge: 2021-05-13 | Disposition: A | Discharge status: Home / Self Care | Payer: Medicaid Other | Attending: Emergency Medicine | Admitting: Emergency Medicine

## 2021-05-13 ENCOUNTER — Other Ambulatory Visit: Payer: Self-pay

## 2021-05-13 ENCOUNTER — Emergency Department (HOSPITAL_COMMUNITY): Payer: Medicaid Other

## 2021-05-13 ENCOUNTER — Encounter (HOSPITAL_COMMUNITY): Payer: Self-pay

## 2021-05-13 DIAGNOSIS — Y990 Civilian activity done for income or pay: Secondary | ICD-10-CM | POA: Insufficient documentation

## 2021-05-13 DIAGNOSIS — W1839XA Other fall on same level, initial encounter: Secondary | ICD-10-CM | POA: Insufficient documentation

## 2021-05-13 DIAGNOSIS — I10 Essential (primary) hypertension: Secondary | ICD-10-CM | POA: Diagnosis not present

## 2021-05-13 DIAGNOSIS — J45909 Unspecified asthma, uncomplicated: Secondary | ICD-10-CM | POA: Insufficient documentation

## 2021-05-13 DIAGNOSIS — S4991XA Unspecified injury of right shoulder and upper arm, initial encounter: Secondary | ICD-10-CM | POA: Diagnosis present

## 2021-05-13 DIAGNOSIS — F17210 Nicotine dependence, cigarettes, uncomplicated: Secondary | ICD-10-CM | POA: Insufficient documentation

## 2021-05-13 DIAGNOSIS — S46001A Unspecified injury of muscle(s) and tendon(s) of the rotator cuff of right shoulder, initial encounter: Secondary | ICD-10-CM | POA: Diagnosis not present

## 2021-05-13 DIAGNOSIS — R7309 Other abnormal glucose: Secondary | ICD-10-CM | POA: Diagnosis not present

## 2021-05-13 LAB — CBG MONITORING, ED: Glucose-Capillary: 125 mg/dL — ABNORMAL HIGH (ref 70–99)

## 2021-05-13 MED ORDER — NAPROXEN 500 MG PO TABS
500.0000 mg | ORAL_TABLET | Freq: Once | ORAL | Status: AC
  Administered 2021-05-13: 500 mg via ORAL
  Filled 2021-05-13: qty 1

## 2021-05-13 MED ORDER — HYDROCODONE-ACETAMINOPHEN 5-325 MG PO TABS
1.0000 | ORAL_TABLET | Freq: Once | ORAL | Status: AC
  Administered 2021-05-13: 1 via ORAL
  Filled 2021-05-13: qty 1

## 2021-05-13 MED ORDER — NAPROXEN 375 MG PO TABS: ORAL_TABLET | ORAL | 0 refills | Status: DC

## 2021-05-13 MED ORDER — HYDROCODONE-ACETAMINOPHEN 5-325 MG PO TABS: 1.0000 | ORAL_TABLET | Freq: Four times a day (QID) | ORAL | 0 refills | Status: DC | PRN

## 2021-05-13 NOTE — ED Triage Notes (Signed)
Pt reports with right arm pain since Thursday after falling while at work. Pt states that she is unable to raise her arm, and the pain is more towards the top.  ?

## 2021-05-13 NOTE — ED Provider Notes (Signed)
? ?[facility]-EMERGENCY DEPT ?Provider Note: Lowella Dell, MD, FACEP ? ?CSN: [id number] ?MRN: [id number] ?ARRIVAL: 05/13/21 at 0420 ?ROOM: [facility] ? ? ?CHIEF COMPLAINT  ?Shoulder Injury ? ? ?HISTORY OF PRESENT ILLNESS  ?05/13/21 4:38 AM ?Barbara Bruce is a 36 y.o. female who fell at work 5 days ago and injured her right shoulder.  Pain was not severe at first and she was prescribed Flexeril.  The pain is gradually worsened and it is now severe.  She has limited range of movement of her right shoulder particularly abduction.  She rates her pain as a 10 out of 10, aching and sore in nature.  She has no numbness or weakness distal to her right shoulder.  She has immobilized her right upper extremity in a sling. ? ? ?Past Medical History:  ?Diagnosis Date  ? Asthma   ? GERD (gastroesophageal reflux disease)   ? Glands swollen   ? Per client, in neck  ? History of anemia   ? History of blood transfusion   ? History of breast lump   ? History of shortness of breath   ? History of TMJ disorder   ? History of weight gain   ? Hypertension   ? Plantar fasciitis, bilateral   ? Polysubstance abuse (HCC)   ? repeatedly positive for cocaine, THC  ? Ulcer   ? ? ?Past Surgical History:  ?Procedure Laterality Date  ? history of tooth extraction    ? 12 teeth removed under general anesthesia in 2020.  ? infertility    ? ? ?Family History  ?Problem Relation Age of Onset  ? Hypertension Paternal Grandmother   ? Hypertension Father   ? Diabetes Father   ? Kidney failure Father   ? Hypertension Mother   ? ADD / ADHD Sister   ? ? ?Social History  ? ?Tobacco Use  ? Smoking status: Every Day  ?  Packs/day: 1.00  ?  Years: 12.00  ?  Pack years: 12.00  ?  Types: Cigarettes  ? Smokeless tobacco: Never  ?Vaping Use  ? Vaping Use: Never used  ?Substance Use Topics  ? Alcohol use: Yes  ?  Comment: Reports last ETOH use 02/03/2020  ? Drug use: Yes  ?  Types: Cocaine, Marijuana  ?  Comment: Reports last marijuana 10/2019, last powder cocaine use 09/2019,  hx of 07/2018 overdose with "heroin pills" per client.  ? ? ?Prior to Admission medications   ?Medication Sig Start Date End Date Taking? Authorizing Provider  ?HYDROcodone-acetaminophen (NORCO) 5-325 MG tablet Take 1 tablet by mouth every 6 (six) hours as needed for severe pain. 05/13/21  Yes Delphin Funes, MD  ?naproxen (NAPROSYN) 375 MG tablet Take 1 tablet twice daily for shoulder pain. 05/13/21  Yes Glendora Clouatre, MD  ?albuterol (VENTOLIN HFA) 108 (90 Base) MCG/ACT inhaler Inhale 2 puffs into the lungs every 4 (four) hours as needed for wheezing or shortness of breath. 10/11/20   Dorcas Carrow, MD  ? ? ?Allergies ?Patient has no known allergies. ? ? ?REVIEW OF SYSTEMS  ?Negative except as noted here or in the History of Present Illness. ? ? ?PHYSICAL EXAMINATION  ?Initial Vital Signs ?Blood pressure (!) 192/124, pulse (!) 137, temperature 98 ?F (36.7 ?C), temperature source Oral, resp. rate 19, height 4\' 9"  (1.448 m), weight 97.5 kg, SpO2 99 %. ? ?Examination ?General: Well-developed, well-nourished female in no acute distress; appearance consistent with age of record ?HENT: normocephalic; atraumatic ?Eyes: Normal appearance ?Neck: supple ?Heart: regular rate  and rhythm ?Lungs: clear to auscultation bilaterally ?Abdomen: soft; nondistended; nontender; bowel sounds present ?Extremities: No deformity; pain on attempted movement right shoulder, right upper extremity distally neurovascularly intact ?Neurologic: Awake, alert and oriented; motor function intact in all extremities and symmetric; no facial droop ?Skin: Warm and dry ?Psychiatric: Tearful ? ? ?RESULTS  ?Summary of this visit's results, reviewed and interpreted by myself: ? ? EKG Interpretation ? ?Date/Time:    ?Ventricular Rate:    ?PR Interval:    ?QRS Duration:   ?QT Interval:    ?QTC Calculation:   ?R Axis:     ?Text Interpretation:   ?  ? ?  ? ?Laboratory Studies: ?Results for orders placed or performed during the hospital encounter of 05/13/21 (from the  past 24 hour(s))  ?CBG monitoring, ED     Status: Abnormal  ? Collection Time: 05/13/21  5:17 AM  ?Result Value Ref Range  ? Glucose-Capillary 125 (H) 70 - 99 mg/dL  ? ?Imaging Studies: ?DG Shoulder Right ? ?Result Date: 05/13/2021 ?CLINICAL DATA:  Fall with shoulder pain EXAM: RIGHT SHOULDER - 2+ VIEW COMPARISON:  None. FINDINGS: There is no evidence of fracture or dislocation. There is no evidence of arthropathy or other focal bone abnormality. Soft tissues are unremarkable. IMPRESSION: Negative. Electronically Signed   By: Tiburcio Pea M.D.   On: 05/13/2021 05:09   ? ?ED COURSE and MDM  ?Nursing notes, initial and subsequent vitals signs, including pulse oximetry, reviewed and interpreted by myself. ? ?Vitals:  ? 05/13/21 0433 05/13/21 0520  ?BP: (!) 192/124 (!) 163/111  ?Pulse: (!) 137 (!) 117  ?Resp: 19 (!) 22  ?Temp: 98 ?F (36.7 ?C) 98.2 ?F (36.8 ?C)  ?TempSrc: Oral Oral  ?SpO2: 99% 98%  ?Weight: 97.5 kg   ?Height: 4\' 9"  (1.448 m)   ? ?Medications  ?HYDROcodone-acetaminophen (NORCO/VICODIN) 5-325 MG per tablet 1 tablet (1 tablet Oral Given 05/13/21 0507)  ?naproxen (NAPROSYN) tablet 500 mg (500 mg Oral Given 05/13/21 0507)  ? ?No radiographic evidence of fracture or dislocation.  I suspect a rotator cuff injury.  The cause of the patient's tachycardia is unclear.  It may be due to pain and anxiety.  She does report increased thirst and urination but her blood sugars only 125.  She has had elevated blood sugar in the past.  She does not currently have a PCP but she was encouraged to find a PCP as she may be becoming diabetic. ? ? ?PROCEDURES  ?Procedures ? ? ?ED DIAGNOSES  ? ?  ICD-10-CM   ?1. Injury of right rotator cuff, initial encounter  S46.001A   ?  ? ? ? ?  ?07/13/21, MD ?05/13/21 07/13/21 ? ?

## 2021-08-14 ENCOUNTER — Ambulatory Visit: Payer: Medicaid Other

## 2021-09-15 ENCOUNTER — Emergency Department (HOSPITAL_COMMUNITY)
Admission: EM | Admit: 2021-09-15 | Discharge: 2021-09-16 | Disposition: A | Discharge status: Home / Self Care | Payer: Medicaid Other | Attending: Student | Admitting: Student

## 2021-09-15 ENCOUNTER — Other Ambulatory Visit: Payer: Self-pay

## 2021-09-15 ENCOUNTER — Encounter (HOSPITAL_COMMUNITY): Payer: Self-pay | Admitting: Emergency Medicine

## 2021-09-15 DIAGNOSIS — H66002 Acute suppurative otitis media without spontaneous rupture of ear drum, left ear: Secondary | ICD-10-CM | POA: Diagnosis not present

## 2021-09-15 DIAGNOSIS — H9203 Otalgia, bilateral: Secondary | ICD-10-CM | POA: Diagnosis present

## 2021-09-15 NOTE — ED Triage Notes (Signed)
Patient reports bilateral ear pain since yesterday.

## 2021-09-16 MED ORDER — AMOXICILLIN-POT CLAVULANATE 875-125 MG PO TABS
1.0000 | ORAL_TABLET | Freq: Once | ORAL | Status: AC
  Administered 2021-09-16: 1 via ORAL
  Filled 2021-09-16: qty 1

## 2021-09-16 MED ORDER — AMOXICILLIN-POT CLAVULANATE 875-125 MG PO TABS: 1.0000 | ORAL_TABLET | Freq: Two times a day (BID) | ORAL | 0 refills | Status: AC

## 2021-09-16 MED ORDER — IBUPROFEN 800 MG PO TABS
800.0000 mg | ORAL_TABLET | Freq: Once | ORAL | Status: AC
  Administered 2021-09-16: 800 mg via ORAL
  Filled 2021-09-16: qty 1

## 2021-09-16 NOTE — Discharge Instructions (Signed)
You are seen here today for ear pain.  You were found to have an ear infection.  You been prescribed an antibiotic which you should take for the entire course.  You may use Tylenol and either ibuprofen or naproxen as needed for discomfort.  Follow-up with your primary care doctor and return to the ER with any new severe symptoms.

## 2021-09-16 NOTE — ED Provider Notes (Signed)
[facility]-EMERGENCY DEPT Provider Note   CSN: [id number] Arrival date & time: 09/15/21  2339     History  Chief Complaint  Patient presents with   Otalgia    Barbara Bruce is a 36 y.o. female who presents with 24 hours of bilateral ear pain left greater than right.  History of ear infections in the past but has not been on antibiotics for multiple months.  No drainage from the ears, no other infectious symptoms.  I personally reviewed her medical records previous history of polysubstance abuse, GERD, and anemia.  She is not anticoagulated or immunocompromised at this time.  HPI     Home Medications Prior to Admission medications   Medication Sig Start Date End Date Taking? Authorizing Provider  amoxicillin-clavulanate (AUGMENTIN) 875-125 MG tablet Take 1 tablet by mouth every 12 (twelve) hours for 7 days. 09/16/21 09/23/21 Yes Dorise Gangi R, PA-C  albuterol (VENTOLIN HFA) 108 (90 Base) MCG/ACT inhaler Inhale 2 puffs into the lungs every 4 (four) hours as needed for wheezing or shortness of breath. 10/11/20   Dorcas Carrow, MD  HYDROcodone-acetaminophen (NORCO) 5-325 MG tablet Take 1 tablet by mouth every 6 (six) hours as needed for severe pain. 05/13/21   Molpus, John, MD  naproxen (NAPROSYN) 375 MG tablet Take 1 tablet twice daily for shoulder pain. 05/13/21   Molpus, Jonny Ruiz, MD      Allergies    Patient has no known allergies.    Review of Systems   Review of Systems  HENT:  Positive for ear pain.   All other systems reviewed and are negative.   Physical Exam Updated Vital Signs BP (!) 175/108 (BP Location: Right Arm)   Pulse 92   Temp 98.8 F (37.1 C) (Oral)   Resp 20   Ht 4\' 9"  (1.448 m)   Wt 90.7 kg   LMP 08/09/2021   SpO2 95%   BMI 43.28 kg/m  Physical Exam Vitals and nursing note reviewed.  Constitutional:      Appearance: She is not ill-appearing or toxic-appearing.  HENT:     Head: Normocephalic and atraumatic.     Right Ear:  Tympanic membrane and ear canal normal. No mastoid tenderness.     Left Ear: A middle ear effusion is present. No mastoid tenderness. Tympanic membrane is erythematous and retracted.     Nose: Nose normal.     Mouth/Throat:     Mouth: Mucous membranes are moist.     Pharynx: No oropharyngeal exudate or posterior oropharyngeal erythema.  Eyes:     General:        Right eye: No discharge.        Left eye: No discharge.     Extraocular Movements: Extraocular movements intact.     Conjunctiva/sclera: Conjunctivae normal.     Pupils: Pupils are equal, round, and reactive to light.  Cardiovascular:     Rate and Rhythm: Normal rate and regular rhythm.     Pulses: Normal pulses.     Heart sounds: Normal heart sounds. No murmur heard. Pulmonary:     Effort: Pulmonary effort is normal. No respiratory distress.     Breath sounds: Normal breath sounds. No wheezing or rales.  Abdominal:     General: There is no distension.     Palpations: Abdomen is soft.     Tenderness: There is no abdominal tenderness.  Musculoskeletal:        General: No deformity.     Cervical back: Neck supple.  Skin:    General: Skin is warm and dry.     Capillary Refill: Capillary refill takes less than 2 seconds.  Neurological:     General: No focal deficit present.     Mental Status: She is alert and oriented to person, place, and time. Mental status is at baseline.  Psychiatric:        Mood and Affect: Mood normal.     ED Results / Procedures / Treatments   Labs (all labs ordered are listed, but only abnormal results are displayed) Labs Reviewed - No data to display  EKG None  Radiology No results found.  Procedures Procedures    Medications Ordered in ED Medications  amoxicillin-clavulanate (AUGMENTIN) 875-125 MG per tablet 1 tablet (1 tablet Oral Given 09/16/21 0316)  ibuprofen (ADVIL) tablet 800 mg (800 mg Oral Given 09/16/21 0316)    ED Course/ Medical Decision Making/ A&P                            Medical Decision Making 36 year old female presents with concern for bilateral ear pain.  Hypertensive on intake and vital signs otherwise normal.  Cardiopulmonary abdominal exams are benign.  Patient with retracted erythematous left TM with middle ear effusion consistent with left otitis media.  Mild right-sided dullness to the TM without other changes to suggest bilateral infection.  No mastoid tenderness bilaterally.  No external auditory canal skin changes to suggest otitis externa.  Risk Prescription drug management.   First of antibiotics administered in the ED and patient was discharged with prescription for antibiotics.  Recommend OTC analgesia as needed and follow-up with her PCP.  Clinical concern for emergent underlying etiology or further ED work-up or inpatient management is exceedingly low.  Barbara Bruce voiced understanding of her medical evaluation and treatment plan. Each of their questions answered to their expressed satisfaction.  Return precautions were given.  Patient is well-appearing, stable, and was discharged in good condition.  This chart was dictated using voice recognition software, Dragon. Despite the best efforts of this provider to proofread and correct errors, errors may still occur which can change documentation meaning.          Final Clinical Impression(s) / ED Diagnoses Final diagnoses:  Non-recurrent acute suppurative otitis media of left ear without spontaneous rupture of tympanic membrane    Rx / DC Orders ED Discharge Orders          Ordered    amoxicillin-clavulanate (AUGMENTIN) 875-125 MG tablet  Every 12 hours        09/16/21 0309              Trygve Thal, Eugene Gavia, PA-C 09/16/21 0736    Glendora Score, MD 09/23/21 0301

## 2021-11-14 ENCOUNTER — Emergency Department: Payer: Medicaid Other

## 2021-11-14 ENCOUNTER — Emergency Department
Admission: EM | Admit: 2021-11-14 | Discharge: 2021-11-14 | Disposition: A | Discharge status: Home / Self Care | Payer: Medicaid Other | Attending: Emergency Medicine | Admitting: Emergency Medicine

## 2021-11-14 ENCOUNTER — Other Ambulatory Visit: Payer: Self-pay

## 2021-11-14 DIAGNOSIS — H6093 Unspecified otitis externa, bilateral: Secondary | ICD-10-CM | POA: Insufficient documentation

## 2021-11-14 DIAGNOSIS — H9203 Otalgia, bilateral: Secondary | ICD-10-CM | POA: Diagnosis present

## 2021-11-14 DIAGNOSIS — R519 Headache, unspecified: Secondary | ICD-10-CM | POA: Diagnosis not present

## 2021-11-14 DIAGNOSIS — I10 Essential (primary) hypertension: Secondary | ICD-10-CM | POA: Diagnosis not present

## 2021-11-14 DIAGNOSIS — H60503 Unspecified acute noninfective otitis externa, bilateral: Secondary | ICD-10-CM

## 2021-11-14 LAB — CBC WITH DIFFERENTIAL/PLATELET
Abs Immature Granulocytes: 0.07 10*3/uL (ref 0.00–0.07)
Basophils Absolute: 0.1 10*3/uL (ref 0.0–0.1)
Basophils Relative: 0 %
Eosinophils Absolute: 0.3 10*3/uL (ref 0.0–0.5)
Eosinophils Relative: 2 %
HCT: 39 % (ref 36.0–46.0)
Hemoglobin: 12.2 g/dL (ref 12.0–15.0)
Immature Granulocytes: 0 %
Lymphocytes Relative: 14 %
Lymphs Abs: 2.6 10*3/uL (ref 0.7–4.0)
MCH: 25.1 pg — ABNORMAL LOW (ref 26.0–34.0)
MCHC: 31.3 g/dL (ref 30.0–36.0)
MCV: 80.1 fL (ref 80.0–100.0)
Monocytes Absolute: 1.1 10*3/uL — ABNORMAL HIGH (ref 0.1–1.0)
Monocytes Relative: 6 %
Neutro Abs: 14.2 10*3/uL — ABNORMAL HIGH (ref 1.7–7.7)
Neutrophils Relative %: 78 %
Platelets: 333 10*3/uL (ref 150–400)
RBC: 4.87 MIL/uL (ref 3.87–5.11)
RDW: 15.5 % (ref 11.5–15.5)
WBC: 18.3 10*3/uL — ABNORMAL HIGH (ref 4.0–10.5)
nRBC: 0 % (ref 0.0–0.2)

## 2021-11-14 LAB — BASIC METABOLIC PANEL
Anion gap: 8 (ref 5–15)
BUN: <5 mg/dL — ABNORMAL LOW (ref 6–20)
CO2: 27 mmol/L (ref 22–32)
Calcium: 9.1 mg/dL (ref 8.9–10.3)
Chloride: 101 mmol/L (ref 98–111)
Creatinine, Ser: 0.57 mg/dL (ref 0.44–1.00)
GFR, Estimated: >60 mL/min (ref >60)
Glucose, Bld: 122 mg/dL — ABNORMAL HIGH (ref 70–99)
Potassium: 3.2 mmol/L — ABNORMAL LOW (ref 3.5–5.1)
Sodium: 136 mmol/L (ref 135–145)

## 2021-11-14 MED ORDER — SODIUM CHLORIDE 0.9 % IV BOLUS: 1000.0000 mL | Freq: Once | INTRAVENOUS | Status: DC

## 2021-11-14 MED ORDER — AMOXICILLIN-POT CLAVULANATE 875-125 MG PO TABS: 1.0000 | ORAL_TABLET | Freq: Two times a day (BID) | ORAL | 0 refills | Status: AC

## 2021-11-14 MED ORDER — KETOROLAC TROMETHAMINE 15 MG/ML IJ SOLN: 15.0000 mg | Freq: Once | INTRAMUSCULAR | Status: DC

## 2021-11-14 MED ORDER — KETOROLAC TROMETHAMINE 15 MG/ML IJ SOLN
INTRAMUSCULAR | Status: AC
  Administered 2021-11-14: 15 mg via INTRAMUSCULAR
  Filled 2021-11-14: qty 1

## 2021-11-14 MED ORDER — OXYCODONE-ACETAMINOPHEN 5-325 MG PO TABS
2.0000 | ORAL_TABLET | Freq: Once | ORAL | Status: AC
  Administered 2021-11-14: 2 via ORAL
  Filled 2021-11-14: qty 2

## 2021-11-14 MED ORDER — AMOXICILLIN-POT CLAVULANATE 875-125 MG PO TABS
1.0000 | ORAL_TABLET | Freq: Once | ORAL | Status: AC
  Administered 2021-11-14: 1 via ORAL
  Filled 2021-11-14: qty 1

## 2021-11-14 MED ORDER — KETOROLAC TROMETHAMINE 10 MG PO TABS: 10.0000 mg | ORAL_TABLET | Freq: Four times a day (QID) | ORAL | 0 refills | Status: DC | PRN

## 2021-11-14 MED ORDER — KETOROLAC TROMETHAMINE 15 MG/ML IJ SOLN: 15.0000 mg | Freq: Once | INTRAMUSCULAR | Status: AC

## 2021-11-14 NOTE — ED Notes (Signed)
Pt updated on delay, pt verbalizes understanding.  

## 2021-11-14 NOTE — ED Provider Triage Note (Signed)
  Emergency Medicine Provider Triage Evaluation Note  Barbara Bruce , a 36 y.o.female,  was evaluated in triage.  Pt complains of headache and bilateral ear pain.  States that it started yesterday.  She has a history of ear infections in the past, however never this bad.  Denies any other symptoms at this time.   Review of Systems  Positive: Headache, bilateral ear pain. Negative: Denies fever, chest pain, vomiting  Physical Exam  There were no vitals filed for this visit. Gen:   Awake, tearful, appears to be in significant pain. Resp:  Normal effort  MSK:   Moves extremities without difficulty  Other:    Medical Decision Making  Given the patient's initial medical screening exam, the following diagnostic evaluation has been ordered. The patient will be placed in the appropriate treatment space, once one is available, to complete the evaluation and treatment. I have discussed the plan of care with the patient and I have advised the patient that an ED physician or mid-level practitioner will reevaluate their condition after the test results have been received, as the results may give them additional insight into the type of treatment they may need.    Diagnostics: Head CT, labs  Treatments: [place], [place], [place] 11/14/21 1806

## 2021-11-14 NOTE — ED Triage Notes (Signed)
Pt arrives with bilateral ear pain that started yesterday. Pt has hx ear infections.

## 2021-11-14 NOTE — ED Provider Notes (Signed)
[facility] Provider Note    Event Date/Time   First MD Initiated Contact with Patient 11/14/21 2241     (approximate)   History   Chief Complaint: Otalgia   HPI  Barbara Bruce is a 36 y.o. female with a history of hypertension, polysubstance abuse, GERD who comes ED complaining of bilateral ear pain since yesterday.  No headache vision changes or fever.  She does clean her ears frequently with Q-tips.  Denies change in hearing.  No sore throat or neck pain.     Physical Exam   Triage Vital Signs: ED Triage Vitals  Enc Vitals Group     BP 11/14/21 1808 (!) 166/93     Pulse Rate 11/14/21 1808 (!) 103     Resp 11/14/21 1808 18     Temp 11/14/21 1808 98.2 F (36.8 C)     Temp Source 11/14/21 1808 Oral     SpO2 11/14/21 1808 93 %     Weight 11/14/21 1809 200 lb (90.7 kg)     Height 11/14/21 1809 4\' 9"  (1.448 m)     Head Circumference --      Peak Flow --      Pain Score 11/14/21 1809 10     Pain Loc --      Pain Edu? --      Excl. in [place]? --     Most recent vital signs: Vitals:   11/14/21 1808  BP: (!) 166/93  Pulse: (!) 103  Resp: 18  Temp: 98.2 F (36.8 C)  SpO2: 93%    General: Awake, no distress.  CV:  Good peripheral perfusion.  Regular rate and rhythm Resp:  Normal effort.  Abd:  No distention.  Other:  No cervical lymphadenopathy or periauricular lymphadenopathy.  Bilateral auricles are normal.  Bilateral external canals are erythematous and appear dry and abraded, likely from repetitive Q-tip use.  No edema or discharge.  Eardrums are intact.   ED Results / Procedures / Treatments   Labs (all labs ordered are listed, but only abnormal results are displayed) Labs Reviewed  BASIC METABOLIC PANEL - Abnormal; Notable for the following components:      Result Value   Potassium 3.2 (*)    Glucose, Bld 122 (*)    BUN <5 (*)    All other components within normal limits  CBC WITH DIFFERENTIAL/PLATELET - Abnormal; Notable for  the following components:   WBC 18.3 (*)    MCH 25.1 (*)    Neutro Abs 14.2 (*)    Monocytes Absolute 1.1 (*)    All other components within normal limits     EKG    RADIOLOGY CT head interpreted by me, appears normal, no mass or intracranial hemorrhage.  Radiology report reviewed   PROCEDURES:  Procedures   MEDICATIONS ORDERED IN ED: Medications  amoxicillin-clavulanate (AUGMENTIN) 875-125 MG per tablet 1 tablet (has no administration in time range)  oxyCODONE-acetaminophen (PERCOCET/ROXICET) 5-325 MG per tablet 2 tablet (2 tablets Oral Given 11/14/21 1811)  ketorolac (TORADOL) 15 MG/ML injection 15 mg (15 mg Intramuscular Given 11/14/21 2253)     IMPRESSION / MDM / ASSESSMENT AND PLAN / ED COURSE  I reviewed the triage vital signs and the nursing notes.                              Differential diagnosis includes, but is not limited to, otitis externa, intracranial abscess, electrolyte  abnormality, AKI  Patient's presentation is most consistent with acute presentation with potential threat to life or bodily function.  Patient complains of bilateral ear pain.  Not diabetic or immunosuppressed.  She does have otitis externa, most likely noninfectious from mechanical trauma from repetitive cleaning.  Counseled patient on how to improve her ear care and symptoms while maintaining hygiene.  Due to the severe symptoms and bilateral nature, I will start Augmentin.  CT head is negative, labs are normal.  She is nontoxic and suitable for outpatient follow-up.       FINAL CLINICAL IMPRESSION(S) / ED DIAGNOSES   Final diagnoses:  Acute otitis externa of both ears, unspecified type     Rx / DC Orders   ED Discharge Orders          Ordered    amoxicillin-clavulanate (AUGMENTIN) 875-125 MG tablet  2 times daily        11/14/21 2331             Note:  This document was prepared using Dragon voice recognition software and may include unintentional dictation  errors.   Sharman Cheek, MD 11/14/21 2337

## 2022-03-21 DIAGNOSIS — Z111 Encounter for screening for respiratory tuberculosis: Secondary | ICD-10-CM | POA: Diagnosis not present

## 2023-01-04 ENCOUNTER — Inpatient Hospital Stay (HOSPITAL_COMMUNITY)
Admission: EM | Admit: 2023-01-04 | Discharge: 2023-01-07 | DRG: 202 | Disposition: A | Discharge status: Home / Self Care | Payer: Medicaid Other | Attending: Internal Medicine | Admitting: Internal Medicine

## 2023-01-04 ENCOUNTER — Encounter (HOSPITAL_COMMUNITY): Payer: Self-pay

## 2023-01-04 ENCOUNTER — Emergency Department (HOSPITAL_COMMUNITY): Payer: Medicaid Other

## 2023-01-04 DIAGNOSIS — Z1152 Encounter for screening for COVID-19: Secondary | ICD-10-CM

## 2023-01-04 DIAGNOSIS — Z818 Family history of other mental and behavioral disorders: Secondary | ICD-10-CM

## 2023-01-04 DIAGNOSIS — J4551 Severe persistent asthma with (acute) exacerbation: Principal | ICD-10-CM | POA: Diagnosis present

## 2023-01-04 DIAGNOSIS — I10 Essential (primary) hypertension: Secondary | ICD-10-CM | POA: Diagnosis present

## 2023-01-04 DIAGNOSIS — F1410 Cocaine abuse, uncomplicated: Secondary | ICD-10-CM | POA: Diagnosis present

## 2023-01-04 DIAGNOSIS — R0789 Other chest pain: Secondary | ICD-10-CM | POA: Diagnosis present

## 2023-01-04 DIAGNOSIS — R079 Chest pain, unspecified: Secondary | ICD-10-CM | POA: Diagnosis present

## 2023-01-04 DIAGNOSIS — K219 Gastro-esophageal reflux disease without esophagitis: Secondary | ICD-10-CM | POA: Diagnosis present

## 2023-01-04 DIAGNOSIS — F17210 Nicotine dependence, cigarettes, uncomplicated: Secondary | ICD-10-CM | POA: Diagnosis present

## 2023-01-04 DIAGNOSIS — Z8249 Family history of ischemic heart disease and other diseases of the circulatory system: Secondary | ICD-10-CM

## 2023-01-04 DIAGNOSIS — J4521 Mild intermittent asthma with (acute) exacerbation: Secondary | ICD-10-CM | POA: Diagnosis not present

## 2023-01-04 DIAGNOSIS — E66813 Obesity, class 3: Secondary | ICD-10-CM | POA: Diagnosis present

## 2023-01-04 DIAGNOSIS — R739 Hyperglycemia, unspecified: Secondary | ICD-10-CM

## 2023-01-04 DIAGNOSIS — Z6841 Body Mass Index (BMI) 40.0 and over, adult: Secondary | ICD-10-CM

## 2023-01-04 DIAGNOSIS — F1910 Other psychoactive substance abuse, uncomplicated: Secondary | ICD-10-CM | POA: Diagnosis present

## 2023-01-04 DIAGNOSIS — R0602 Shortness of breath: Secondary | ICD-10-CM | POA: Diagnosis not present

## 2023-01-04 DIAGNOSIS — Z841 Family history of disorders of kidney and ureter: Secondary | ICD-10-CM

## 2023-01-04 DIAGNOSIS — J45901 Unspecified asthma with (acute) exacerbation: Secondary | ICD-10-CM | POA: Diagnosis present

## 2023-01-04 DIAGNOSIS — Z833 Family history of diabetes mellitus: Secondary | ICD-10-CM | POA: Diagnosis not present

## 2023-01-04 DIAGNOSIS — J4541 Moderate persistent asthma with (acute) exacerbation: Secondary | ICD-10-CM | POA: Diagnosis not present

## 2023-01-04 DIAGNOSIS — Z720 Tobacco use: Secondary | ICD-10-CM | POA: Diagnosis not present

## 2023-01-04 DIAGNOSIS — R0603 Acute respiratory distress: Secondary | ICD-10-CM | POA: Diagnosis not present

## 2023-01-04 DIAGNOSIS — F1210 Cannabis abuse, uncomplicated: Secondary | ICD-10-CM | POA: Diagnosis present

## 2023-01-04 LAB — RESP PANEL BY RT-PCR (RSV, FLU A&B, COVID)  RVPGX2
Influenza A by PCR: NEGATIVE
Influenza B by PCR: NEGATIVE
Resp Syncytial Virus by PCR: NEGATIVE
SARS Coronavirus 2 by RT PCR: NEGATIVE

## 2023-01-04 LAB — CBC
HCT: 39.3 % (ref 36.0–46.0)
Hemoglobin: 12 g/dL (ref 12.0–15.0)
MCH: 26.8 pg (ref 26.0–34.0)
MCHC: 30.5 g/dL (ref 30.0–36.0)
MCV: 87.7 fL (ref 80.0–100.0)
Platelets: 281 10*3/uL (ref 150–400)
RBC: 4.48 MIL/uL (ref 3.87–5.11)
RDW: 14 % (ref 11.5–15.5)
WBC: 14.5 10*3/uL — ABNORMAL HIGH (ref 4.0–10.5)
nRBC: 0 % (ref 0.0–0.2)

## 2023-01-04 LAB — BASIC METABOLIC PANEL
Anion gap: 8 (ref 5–15)
BUN: 8 mg/dL (ref 6–20)
CO2: 27 mmol/L (ref 22–32)
Calcium: 8.7 mg/dL — ABNORMAL LOW (ref 8.9–10.3)
Chloride: 99 mmol/L (ref 98–111)
Creatinine, Ser: 0.54 mg/dL (ref 0.44–1.00)
GFR, Estimated: >60 mL/min (ref >60)
Glucose, Bld: 163 mg/dL — ABNORMAL HIGH (ref 70–99)
Potassium: 3.5 mmol/L (ref 3.5–5.1)
Sodium: 134 mmol/L — ABNORMAL LOW (ref 135–145)

## 2023-01-04 LAB — HCG, SERUM, QUALITATIVE: Preg, Serum: NEGATIVE

## 2023-01-04 LAB — BLOOD GAS, VENOUS
Acid-Base Excess: 5.5 mmol/L — ABNORMAL HIGH (ref 0.0–2.0)
Bicarbonate: 31.1 mmol/L — ABNORMAL HIGH (ref 20.0–28.0)
Drawn by: 70290
O2 Saturation: 90.9 %
Patient temperature: 37
pCO2, Ven: 48 mm[Hg] (ref 44–60)
pH, Ven: 7.42 (ref 7.25–7.43)
pO2, Ven: 58 mm[Hg] — ABNORMAL HIGH (ref 32–45)

## 2023-01-04 LAB — TROPONIN I (HIGH SENSITIVITY)
Troponin I (High Sensitivity): 4 ng/L (ref <18)
Troponin I (High Sensitivity): 3 ng/L (ref <18)

## 2023-01-04 LAB — D-DIMER, QUANTITATIVE: D-Dimer, Quant: <0.27 ug{FEU}/mL (ref 0.00–0.50)

## 2023-01-04 MED ORDER — IPRATROPIUM-ALBUTEROL 0.5-2.5 (3) MG/3ML IN SOLN
3.0000 mL | Freq: Four times a day (QID) | RESPIRATORY_TRACT | Status: DC
  Administered 2023-01-05 – 2023-01-06 (×7): 3 mL via RESPIRATORY_TRACT
  Filled 2023-01-04 (×8): qty 3

## 2023-01-04 MED ORDER — ENOXAPARIN SODIUM 40 MG/0.4ML IJ SOSY
40.0000 mg | PREFILLED_SYRINGE | INTRAMUSCULAR | Status: DC
  Administered 2023-01-05 – 2023-01-07 (×3): 40 mg via SUBCUTANEOUS
  Filled 2023-01-04 (×3): qty 0.4

## 2023-01-04 MED ORDER — IPRATROPIUM-ALBUTEROL 0.5-2.5 (3) MG/3ML IN SOLN
3.0000 mL | Freq: Once | RESPIRATORY_TRACT | Status: DC
  Filled 2023-01-04: qty 3

## 2023-01-04 MED ORDER — GUAIFENESIN ER 600 MG PO TB12
600.0000 mg | ORAL_TABLET | Freq: Two times a day (BID) | ORAL | Status: DC
  Administered 2023-01-05: 600 mg via ORAL
  Filled 2023-01-04: qty 1

## 2023-01-04 MED ORDER — FENTANYL CITRATE PF 50 MCG/ML IJ SOSY
25.0000 ug | PREFILLED_SYRINGE | INTRAMUSCULAR | Status: DC | PRN
Start: 2023-01-04 — End: 2023-01-07
  Administered 2023-01-05 (×2): 25 ug via INTRAVENOUS
  Administered 2023-01-05: 50 ug via INTRAVENOUS
  Administered 2023-01-05: 25 ug via INTRAVENOUS
  Administered 2023-01-06: 50 ug via INTRAVENOUS
  Administered 2023-01-06: 25 ug via INTRAVENOUS
  Administered 2023-01-06 – 2023-01-07 (×4): 50 ug via INTRAVENOUS
  Administered 2023-01-07: 25 ug via INTRAVENOUS
  Administered 2023-01-07: 50 ug via INTRAVENOUS
  Filled 2023-01-04 (×12): qty 1

## 2023-01-04 MED ORDER — METHOCARBAMOL 1000 MG/10ML IJ SOLN
500.0000 mg | Freq: Three times a day (TID) | INTRAMUSCULAR | Status: DC | PRN
  Administered 2023-01-05 – 2023-01-07 (×5): 500 mg via INTRAVENOUS
  Filled 2023-01-04 (×5): qty 10

## 2023-01-04 MED ORDER — ONDANSETRON HCL 4 MG PO TABS: 4.0000 mg | ORAL_TABLET | Freq: Four times a day (QID) | ORAL | Status: DC | PRN

## 2023-01-04 MED ORDER — NICOTINE 14 MG/24HR TD PT24: 14.0000 mg | MEDICATED_PATCH | Freq: Every day | TRANSDERMAL | Status: DC

## 2023-01-04 MED ORDER — SODIUM CHLORIDE 0.9 % IV SOLN: INTRAVENOUS | Status: AC

## 2023-01-04 MED ORDER — IPRATROPIUM-ALBUTEROL 0.5-2.5 (3) MG/3ML IN SOLN
3.0000 mL | Freq: Once | RESPIRATORY_TRACT | Status: AC
  Administered 2023-01-04: 3 mL via RESPIRATORY_TRACT

## 2023-01-04 MED ORDER — ALBUTEROL SULFATE (2.5 MG/3ML) 0.083% IN NEBU
7.5000 mg/h | INHALATION_SOLUTION | Freq: Once | RESPIRATORY_TRACT | Status: AC
  Administered 2023-01-04: 7.5 mg/h via RESPIRATORY_TRACT
  Filled 2023-01-04: qty 3

## 2023-01-04 MED ORDER — PREDNISONE 20 MG PO TABS: 40.0000 mg | ORAL_TABLET | Freq: Every day | ORAL | Status: DC

## 2023-01-04 MED ORDER — HYDROCODONE-ACETAMINOPHEN 5-325 MG PO TABS
1.0000 | ORAL_TABLET | ORAL | Status: DC | PRN
  Administered 2023-01-05 – 2023-01-07 (×8): 2 via ORAL
  Filled 2023-01-04 (×8): qty 2

## 2023-01-04 MED ORDER — KETOROLAC TROMETHAMINE 30 MG/ML IJ SOLN
30.0000 mg | Freq: Once | INTRAMUSCULAR | Status: AC
  Administered 2023-01-04: 30 mg via INTRAVENOUS
  Filled 2023-01-04: qty 1

## 2023-01-04 MED ORDER — METHYLPREDNISOLONE SODIUM SUCC 40 MG IJ SOLR
40.0000 mg | Freq: Two times a day (BID) | INTRAMUSCULAR | Status: DC
  Administered 2023-01-05: 40 mg via INTRAVENOUS
  Filled 2023-01-04: qty 1

## 2023-01-04 MED ORDER — ACETAMINOPHEN 325 MG PO TABS: 650.0000 mg | ORAL_TABLET | Freq: Four times a day (QID) | ORAL | Status: DC | PRN

## 2023-01-04 MED ORDER — MAGNESIUM SULFATE 2 GM/50ML IV SOLN
2.0000 g | Freq: Once | INTRAVENOUS | Status: AC
  Administered 2023-01-04: 2 g via INTRAVENOUS
  Filled 2023-01-04: qty 50

## 2023-01-04 MED ORDER — MORPHINE SULFATE (PF) 4 MG/ML IV SOLN
4.0000 mg | Freq: Once | INTRAVENOUS | Status: AC
  Administered 2023-01-04: 4 mg via INTRAVENOUS
  Filled 2023-01-04: qty 1

## 2023-01-04 MED ORDER — HYDRALAZINE HCL 20 MG/ML IJ SOLN: 10.0000 mg | INTRAMUSCULAR | Status: DC | PRN

## 2023-01-04 MED ORDER — METHYLPREDNISOLONE SODIUM SUCC 125 MG IJ SOLR
125.0000 mg | Freq: Once | INTRAMUSCULAR | Status: AC
  Administered 2023-01-04: 125 mg via INTRAVENOUS
  Filled 2023-01-04: qty 2

## 2023-01-04 MED ORDER — ACETAMINOPHEN 650 MG RE SUPP: 650.0000 mg | Freq: Four times a day (QID) | RECTAL | Status: DC | PRN

## 2023-01-04 MED ORDER — ALBUTEROL SULFATE (2.5 MG/3ML) 0.083% IN NEBU
2.5000 mg | INHALATION_SOLUTION | RESPIRATORY_TRACT | Status: DC | PRN
  Administered 2023-01-07: 2.5 mg via RESPIRATORY_TRACT
  Filled 2023-01-04: qty 3

## 2023-01-04 MED ORDER — ONDANSETRON HCL 4 MG/2ML IJ SOLN
4.0000 mg | Freq: Four times a day (QID) | INTRAMUSCULAR | Status: DC | PRN
  Administered 2023-01-06: 4 mg via INTRAVENOUS
  Filled 2023-01-04: qty 2

## 2023-01-04 NOTE — ED Provider Notes (Signed)
[place] EMERGENCY DEPARTMENT AT [facility] Provider Note   CSN: [id number] Arrival date & time: 01/04/23  8295    History  Chief Complaint  Patient presents with   Chest Pain   Shortness of Breath    Barbara Bruce is a 37 y.o. female history of hypertension, polysubstance use, asthma here for evaluation of central chest pain which she described as tightness, shortness of breath and cough x 1 day.  Feels like she cannot take a full breath.  She admits to prior hospitalization for her asthma.  She has been using her inhaler which has helped some however she still has significant pain.  No back pain, pain or swelling to the legs, hemoptysis.  No history of PE or DVT.  No sick contacts.  No abdominal pain, emesis, back pain, chronic NSAID use, EtOH use, pain or swelling to lower legs  Initially said she had prior intubation however states she had a "tube in my nose when I was vomiting."  I suspect this was likely an NG tube  HPI     Home Medications Prior to Admission medications   Medication Sig Start Date End Date Taking? Authorizing Provider  acetaminophen (TYLENOL) 500 MG tablet Take 500 mg by mouth every 6 (six) hours as needed for moderate pain (pain score 4-6).   Yes [provider]  albuterol (VENTOLIN HFA) 108 (90 Base) MCG/ACT inhaler Inhale 2 puffs into the lungs every 4 (four) hours as needed for wheezing or shortness of breath. Patient not taking: Reported on 01/04/2023 10/11/20   Dorcas Carrow, MD  HYDROcodone-acetaminophen (NORCO) 5-325 MG tablet Take 1 tablet by mouth every 6 (six) hours as needed for severe pain. Patient not taking: Reported on 01/04/2023 05/13/21   Molpus, Jonny Ruiz, MD  ketorolac (TORADOL) 10 MG tablet Take 1 tablet (10 mg total) by mouth every 6 (six) hours as needed for moderate pain. Patient not taking: Reported on 01/04/2023 11/14/21   Sharman Cheek, MD  naproxen (NAPROSYN) 375 MG tablet Take 1 tablet twice daily for shoulder  pain. Patient not taking: Reported on 01/04/2023 05/13/21   Molpus, Jonny Ruiz, MD      Allergies    Patient has no known allergies.    Review of Systems   Review of Systems  Constitutional: Negative.   HENT: Negative.    Respiratory:  Positive for cough, chest tightness, shortness of breath and wheezing.   Cardiovascular:  Positive for chest pain. Negative for palpitations and leg swelling.  Gastrointestinal: Negative.   Genitourinary: Negative.   Musculoskeletal: Negative.   Skin: Negative.   Neurological: Negative.   All other systems reviewed and are negative.  Physical Exam Updated Vital Signs BP (!) 160/99   Pulse (!) 104   Temp 98 F (36.7 C)   Resp (!) 24   Ht 4\' 9"  (1.448 m)   Wt 95.3 kg   LMP 12/21/2022 (Approximate)   SpO2 94%   BMI 45.44 kg/m  Physical Exam Vitals and nursing note reviewed.  Constitutional:      General: She is not in acute distress.    Appearance: She is well-developed. She is not ill-appearing, toxic-appearing or diaphoretic.  HENT:     Head: Atraumatic.  Eyes:     Pupils: Pupils are equal, round, and reactive to light.  Cardiovascular:     Rate and Rhythm: Normal rate.     Pulses:          Radial pulses are 2+ on the right side  and 2+ on the left side.       Dorsalis pedis pulses are 2+ on the right side and 2+ on the left side.     Heart sounds: Normal heart sounds.  Pulmonary:     Effort: Tachypnea, accessory muscle usage and respiratory distress present.     Breath sounds: Decreased breath sounds and wheezing present.  Chest:     Chest wall: No mass, deformity, tenderness or crepitus.  Abdominal:     General: Bowel sounds are normal. There is no distension.     Palpations: Abdomen is soft.  Musculoskeletal:        General: Normal range of motion.     Cervical back: Normal range of motion.     Right lower leg: No tenderness. No edema.     Left lower leg: No tenderness. No edema.     Comments: No bony tenderness, full range of  motion, compartment soft, nontender calves bilaterally  Skin:    General: Skin is warm and dry.  Neurological:     General: No focal deficit present.     Mental Status: She is alert.  Psychiatric:        Mood and Affect: Mood normal.    ED Results / Procedures / Treatments   Labs (all labs ordered are listed, but only abnormal results are displayed) Labs Reviewed  BASIC METABOLIC PANEL - Abnormal; Notable for the following components:      Result Value   Sodium 134 (*)    Glucose, Bld 163 (*)    Calcium 8.7 (*)    All other components within normal limits  CBC - Abnormal; Notable for the following components:   WBC 14.5 (*)    All other components within normal limits  RESP PANEL BY RT-PCR (RSV, FLU A&B, COVID)  RVPGX2  RESPIRATORY PANEL BY PCR  HCG, SERUM, QUALITATIVE  D-DIMER, QUANTITATIVE  BLOOD GAS, VENOUS  CREATININE, URINE, RANDOM  PHOSPHORUS  HEPATIC FUNCTION PANEL  RAPID URINE DRUG SCREEN, HOSP PERFORMED  TROPONIN I (HIGH SENSITIVITY)  TROPONIN I (HIGH SENSITIVITY)    EKG EKG Interpretation Date/Time:  Monday January 04 2023 19:18:00 EST Ventricular Rate:  93 PR Interval:  125 QRS Duration:  70 QT Interval:  303 QTC Calculation: 377 R Axis:   73  Text Interpretation: Sinus rhythm No significant change since last tracing Confirmed by Gwyneth Sprout ([id number]) on 01/04/2023 9:34:01 PM  Radiology DG Chest 2 View  Result Date: 01/04/2023 CLINICAL DATA:  Dyspnea and chest pain EXAM: CHEST - 2 VIEW COMPARISON:  12/19/2020 chest radiograph. FINDINGS: Stable cardiomediastinal silhouette with top-normal heart size. No pneumothorax. No pleural effusion. Lungs appear clear, with no acute consolidative airspace disease and no pulmonary edema. IMPRESSION: No active cardiopulmonary disease. Electronically Signed   By: Delbert Phenix M.D.   On: 01/04/2023 19:44    Procedures .Critical Care  Performed by: Linwood Dibbles, PA-C Authorized by: Linwood Dibbles,  PA-C   Critical care provider statement:    Critical care time (minutes):  35   Critical care was necessary to treat or prevent imminent or life-threatening deterioration of the following conditions:  Respiratory failure   Critical care was time spent personally by me on the following activities:  Development of treatment plan with patient or surrogate, discussions with consultants, evaluation of patient's response to treatment, examination of patient, ordering and review of laboratory studies, ordering and review of radiographic studies, ordering and performing treatments and interventions, pulse oximetry, re-evaluation of patient's  condition and review of old charts     Medications Ordered in ED Medications  ipratropium-albuterol (DUONEB) 0.5-2.5 (3) MG/3ML nebulizer solution 3 mL (has no administration in time range)  albuterol (PROVENTIL) (2.5 MG/3ML) 0.083% nebulizer solution (7.5 mg/hr Nebulization Given 01/04/23 2042)  magnesium sulfate IVPB 2 g 50 mL (0 g Intravenous Stopped 01/04/23 2237)  methylPREDNISolone sodium succinate (SOLU-MEDROL) 125 mg/2 mL injection 125 mg (125 mg Intravenous Given 01/04/23 2102)  morphine (PF) 4 MG/ML injection 4 mg (4 mg Intravenous Given 01/04/23 2129)  ipratropium-albuterol (DUONEB) 0.5-2.5 (3) MG/3ML nebulizer solution 3 mL (3 mLs Nebulization Given 01/04/23 2219)  ketorolac (TORADOL) 30 MG/ML injection 30 mg (30 mg Intravenous Given 01/04/23 2232)   ED Course/ Medical Decision Making/ A&P   37 year old history of asthma here for evaluation of cough, shortness of breath and chest pain.  Symptoms started yesterday.  History of asthma using inhaler which has helped some.  She has had prior hospitalizations and intubations due to asthma according to patient.  She is afebrile, nonseptic.  On arrival she has decreased lung sounds and expiratory wheeze.  Speaks in short phrases.  No hypoxia however is tachypneic.  She is no clinical evidence of VTE on exam.   Will plan on labs, imaging and reassess  Labs and imaging personally viewed and interpreted:  CBC leukocytosis 14.5 Pregnancy test negative COVID, flu, RSV negative D-dimer less than 0.27 Chest x-ray without cardiomegaly, pulm edema, pneumothorax, pneumomediastinum EKG without ischemic changes  Due to increased work of breathing patient will be started on 1 hour continuous nebulizer, magnesium, steroids as well as pain control.  Will plan on close reassessment  Patient reassessed.  Still has moderate wheeze despite DuoNeb, steroids, magnesium, 1 hour continuous nebulizer.  D-dimer negative, troponins negative, chest x-ray that evidence of infection.  I have low suspicion for dissection.  Will admit for asthma exacerbation.  Discussed with Dr. Adela Glimpse.  Will see for admission.  The patient appears reasonably stabilized for admission considering the current resources, flow, and capabilities available in the ED at this time, and I doubt any other [facility] requiring further screening and/or treatment in the ED prior to admission.                                Medical Decision Making Amount and/or Complexity of Data Reviewed Independent Historian: friend External Data Reviewed: labs, radiology, ECG and notes. Labs: ordered. Decision-making details documented in ED Course. Radiology: ordered and independent interpretation performed. Decision-making details documented in ED Course. ECG/medicine tests: ordered and independent interpretation performed. Decision-making details documented in ED Course.  Risk OTC drugs. Prescription drug management. Parenteral controlled substances. Decision regarding hospitalization. Diagnosis or treatment significantly limited by social determinants of health.         Final Clinical Impression(s) / ED Diagnoses Final diagnoses:  Severe persistent asthma with exacerbation  Chest tightness    Rx / DC Orders ED Discharge Orders     None          Kruz Chiu A, PA-C 01/04/23 2313    Gwyneth Sprout, MD 01/04/23 2350

## 2023-01-04 NOTE — Assessment & Plan Note (Signed)
Outpatient nutrition follow-up

## 2023-01-04 NOTE — ED Triage Notes (Signed)
Pt c/o central chest pain, SOB, and cough x 1 day.  Pain score 10/10.  Hx of asthma.  Pt reports using inhaler w/ some relief.

## 2023-01-04 NOTE — H&P (Signed)
Barbara Bruce Rings BJY:[id number] DOB: [birth year] DOA: 01/04/2023     PCP: Patient, No Pcp Per   Outpatient Specialists: NONE  Patient arrived to ER on 01/04/23 at 1838 Referred by Attending Gwyneth Sprout, MD   Patient coming from:    home Lives  With family      Chief Complaint:   Chief Complaint  Patient presents with   Chest Pain   Shortness of Breath    HPI: Barbara Bruce is a 37 y.o. female with medical history significant of asthma HTN, polysubstance abuse    Presented with   SOB Pt here with chest pain , SOB, cough  Hx of asthma with prior hospitalization   for her asthma.  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  No family members are sick contacts  Denies significant ETOH intake   Does smoke  but interested in quitting   Lab Results  Component Value Date   SARSCOV2NAA NEGATIVE 01/04/2023   SARSCOV2NAA NEGATIVE 10/11/2020   SARSCOV2NAA NEGATIVE 03/20/2020   SARSCOV2NAA NEGATIVE 07/14/2018        Regarding pertinent Chronic problems:      HTN on not on bp meds     Morbid obesity-   BMI Readings from Last 1 Encounters:  01/04/23 45.44 kg/m       Asthma -well   controlled on home inhalers/ nebs last admission was 2 years ago states she only use albuterol rarely denies history of intubation        While in ER:   Given albuterol continuous neb Solu-Medrol magnesium with improvement Able to speak in full sentences her main complaint now is really more of a chest pain which is reproducible palpation bilaterally worse with coughing    Lab Orders         Resp panel by RT-PCR (RSV, Flu A&B, Covid) Anterior Nasal Swab         Basic metabolic panel         CBC         hCG, serum, qualitative         D-dimer, quantitative      CXR -  NON acute    Following Medications were ordered in ER: Medications   ipratropium-albuterol (DUONEB) 0.5-2.5 (3) MG/3ML nebulizer solution 3 mL (has no administration in time range)  albuterol (PROVENTIL) (2.5 MG/3ML) 0.083% nebulizer solution (7.5 mg/hr Nebulization Given 01/04/23 2042)  magnesium sulfate IVPB 2 g 50 mL (0 g Intravenous Stopped 01/04/23 2237)  methylPREDNISolone sodium succinate (SOLU-MEDROL) 125 mg/2 mL injection 125 mg (125 mg Intravenous Given 01/04/23 2102)  morphine (PF) 4 MG/ML injection 4 mg (4 mg Intravenous Given 01/04/23 2129)  ipratropium-albuterol (DUONEB) 0.5-2.5 (3) MG/3ML nebulizer solution 3 mL (3 mLs Nebulization Given 01/04/23 2219)  ketorolac (TORADOL) 30 MG/ML injection 30 mg (30 mg Intravenous Given 01/04/23 2232)       ED Triage Vitals  Encounter Vitals Group     BP 01/04/23 1845 (!) 175/113     Systolic BP Percentile --      Diastolic BP Percentile --      Pulse Rate 01/04/23 1845 98     Resp 01/04/23 1845 (!) 23     Temp 01/04/23 1848 99.4 F (37.4 C)     Temp Source 01/04/23 1848 Oral     SpO2  01/04/23 1845 96 %     Weight 01/04/23 1848 210 lb (95.3 kg)     Height 01/04/23 1848 4\' 9"  (1.448 m)     Head Circumference --      Peak Flow --      Pain Score 01/04/23 1847 10     Pain Loc --      Pain Education --      Exclude from Growth Chart --   NWGN(56)@     _________________________________________ Significant initial  Findings: Abnormal Labs Reviewed  BASIC METABOLIC PANEL - Abnormal; Notable for the following components:      Result Value   Sodium 134 (*)    Glucose, Bld 163 (*)    Calcium 8.7 (*)    All other components within normal limits  CBC - Abnormal; Notable for the following components:   WBC 14.5 (*)    All other components within normal limits    _________________________ Troponin   Cardiac Panel (last 3 results) Recent Labs    01/04/23 1920  TROPONINIHS 4     ECG: Ordered Personally reviewed and interpreted by me showing: HR : 93 Rhythm:  NSR,     no evidence of ischemic  changes QTC 377   COVID-19 Labs  Recent Labs    01/04/23 2056  DDIMER <0.27    Lab Results  Component Value Date   SARSCOV2NAA NEGATIVE 01/04/2023   SARSCOV2NAA NEGATIVE 10/11/2020   SARSCOV2NAA NEGATIVE 03/20/2020   SARSCOV2NAA NEGATIVE 07/14/2018      The recent clinical data is shown below. Vitals:   01/04/23 1845 01/04/23 1848 01/04/23 2115 01/04/23 2200  BP: (!) 175/113  (!) 163/99 (!) 160/99  Pulse: 98  99 (!) 104  Resp: (!) 23  (!) 24 (!) 24  Temp:  99.4 F (37.4 C)    TempSrc:  Oral    SpO2: 96%  96% 94%  Weight:  95.3 kg    Height:  4\' 9"  (1.448 m)      WBC     Component Value Date/Time   WBC 14.5 (H) 01/04/2023 1920   LYMPHSABS 2.6 11/14/2021 1834   MONOABS 1.1 (H) 11/14/2021 1834   EOSABS 0.3 11/14/2021 1834   BASOSABS 0.1 11/14/2021 1834     Results for orders placed or performed during the hospital encounter of 01/04/23  Resp panel by RT-PCR (RSV, Flu A&B, Covid) Anterior Nasal Swab     Status: None   Collection Time: 01/04/23  7:20 PM   Specimen: Anterior Nasal Swab  Result Value Ref Range Status   SARS Coronavirus 2 by RT PCR NEGATIVE NEGATIVE Final         Influenza A by PCR NEGATIVE NEGATIVE Final   Influenza B by PCR NEGATIVE NEGATIVE Final         Resp Syncytial Virus by PCR NEGATIVE NEGATIVE Final           ________________________________________________________________ Venous  Blood Gas result:  pH  7.42 Acid-Base Excess 5.5 High  mmol/L   pCO2, Ven 48 mmHg O2 Saturation 90.9 %  pO2, Ven 58 High  mmHg     __________________________________________________________ Recent Labs  Lab 01/04/23 1920  NA 134*  K 3.5  CO2 27  GLUCOSE 163*  BUN 8  CREATININE 0.54  CALCIUM 8.7*    Cr   stable,    Lab Results  Component Value Date   CREATININE 0.54 01/04/2023   CREATININE 0.57 11/14/2021   CREATININE 0.59 10/11/2020    No results  for input(s): "AST", "ALT", "ALKPHOS", "BILITOT", "PROT", "ALBUMIN" in the last 168 hours. Lab  Results  Component Value Date   CALCIUM 8.7 (L) 01/04/2023    Plt: Lab Results  Component Value Date   PLT 281 01/04/2023       Recent Labs  Lab 01/04/23 1920  WBC 14.5*  HGB 12.0  HCT 39.3  MCV 87.7  PLT 281    HG/HCT   stable,     Component Value Date/Time   HGB 12.0 01/04/2023 1920   HCT 39.3 01/04/2023 1920   MCV 87.7 01/04/2023 1920     _______________________________________________ Hospitalist was called for admission for   Severe persistent asthma with exacerbation    The following Work up has been ordered so far:  Orders Placed This Encounter  Procedures   Critical Care   Resp panel by RT-PCR (RSV, Flu A&B, Covid) Anterior Nasal Swab   DG Chest 2 View   Basic metabolic panel   CBC   hCG, serum, qualitative   D-dimer, quantitative   Document Height and Actual Weight   ED Cardiac monitoring   Check Peak Flow   Initiate Carrier Fluid Protocol   Consult to hospitalist   ED EKG   EKG 12-Lead   EKG 12-Lead   Insert peripheral IV     Cultures:    Component Value Date/Time   SDES URINE, CLEAN CATCH 10/21/2014 1030   SPECREQUEST NONE 10/21/2014 1030   CULT MULTIPLE SPECIES PRESENT, SUGGEST RECOLLECTION 10/21/2014 1030   REPTSTATUS 10/23/2014 FINAL 10/21/2014 1030     Radiological Exams on Admission: DG Chest 2 View  Result Date: 01/04/2023 CLINICAL DATA:  Dyspnea and chest pain EXAM: CHEST - 2 VIEW COMPARISON:  12/19/2020 chest radiograph. FINDINGS: Stable cardiomediastinal silhouette with top-normal heart size. No pneumothorax. No pleural effusion. Lungs appear clear, with no acute consolidative airspace disease and no pulmonary edema. IMPRESSION: No active cardiopulmonary disease. Electronically Signed   By: Delbert Phenix M.D.   On: 01/04/2023 19:44   _______________________________________________________________________________________________________ Latest  Blood pressure (!) 160/99, pulse (!) 104, temperature 99.4 F (37.4 C), temperature  source Oral, resp. rate (!) 24, height 4\' 9"  (1.448 m), weight 95.3 kg, last menstrual period 12/21/2022, SpO2 94%.   Vitals  labs and radiology finding personally reviewed  Review of Systems:    Pertinent positives include:  chest pain, shortness of breath at rest.  dyspnea on exertion, non-productive cough, wheezing.  Constitutional:  No weight loss, night sweats, Fevers, chills, fatigue, weight loss  HEENT:  No headaches, Difficulty swallowing,Tooth/dental problems,Sore throat,  No sneezing, itching, ear ache, nasal congestion, post nasal drip,  Cardio-vascular:  No  Orthopnea, PND, anasarca, dizziness, palpitations.no Bilateral lower extremity swelling  GI:  No heartburn, indigestion, abdominal pain, nausea, vomiting, diarrhea, change in bowel habits, loss of appetite, melena, blood in stool, hematemesis Resp:  noNo excess mucus, no productive cough,  No coughing up of blood.No change in color of mucus.  Skin:  no rash or lesions. No jaundice GU:  no dysuria, change in color of urine, no urgency or frequency. No straining to urinate.  No flank pain.  Musculoskeletal:  No joint pain or no joint swelling. No decreased range of motion. No back pain.  Psych:  No change in mood or affect. No depression or anxiety. No memory loss.  Neuro: no localizing neurological complaints, no tingling, no weakness, no double vision, no gait abnormality, no slurred speech, no confusion  All systems reviewed and apart from HOPI all are  negative _______________________________________________________________________________________________ Past Medical History:   Past Medical History:  Diagnosis Date   Asthma    GERD (gastroesophageal reflux disease)    Glands swollen    Per client, in neck   History of anemia    History of blood transfusion    History of breast lump    History of shortness of breath    History of TMJ disorder    History of weight gain    Hypertension    Plantar  fasciitis, bilateral    Polysubstance abuse (HCC)    repeatedly positive for cocaine, THC   Ulcer     Past Surgical History:  Procedure Laterality Date   history of tooth extraction     12 teeth removed under general anesthesia in 2020.   infertility      Social History:  Ambulatory   independently       reports that she has been smoking cigarettes. She has a 12 pack-year smoking history. She has never used smokeless tobacco. She reports current alcohol use. She reports current drug use. Drugs: Cocaine and Marijuana.    Family History:   Family History  Problem Relation Age of Onset   Hypertension Paternal Grandmother    Hypertension Father    Diabetes Father    Kidney failure Father    Hypertension Mother    ADD / ADHD Sister    ______________________________________________________________________________________________ Allergies: No Known Allergies   Prior to Admission medications   Medication Sig Start Date End Date Taking? Authorizing Provider  albuterol (VENTOLIN HFA) 108 (90 Base) MCG/ACT inhaler Inhale 2 puffs into the lungs every 4 (four) hours as needed for wheezing or shortness of breath. 10/11/20   Dorcas Carrow, MD  HYDROcodone-acetaminophen (NORCO) 5-325 MG tablet Take 1 tablet by mouth every 6 (six) hours as needed for severe pain. 05/13/21   Molpus, John, MD  ketorolac (TORADOL) 10 MG tablet Take 1 tablet (10 mg total) by mouth every 6 (six) hours as needed for moderate pain. 11/14/21   Sharman Cheek, MD  naproxen (NAPROSYN) 375 MG tablet Take 1 tablet twice daily for shoulder pain. 05/13/21   Molpus, John, MD    ___________________________________________________________________________________________________ Physical Exam:    01/04/2023   10:00 PM 01/04/2023    9:15 PM 01/04/2023    6:48 PM  Vitals with BMI  Height   4\' 9"   Weight   210 lbs  BMI   45.43  Systolic 160 163   Diastolic 99 99   Pulse 104 99      1. General:  in No  Acute  distress complaining of severe pain tearful   2. Psychological: Alert and   Oriented 3. Head/ENT:   Moist  Mucous Membranes                          Head Non traumatic, neck supple                           Poor Dentition 4. SKIN:  decreased Skin turgor,  Skin clean Dry and intact no rash    5. Heart: Regular rate and rhythm no  Murmur, no Rub or gallop 6. Lungs:   wheezes upper respiratory decreased lung sounds 7. Abdomen: Soft,  non-tender, Non distended   obese  bowel sounds present 8. Lower extremities: no clubbing, cyanosis, no  edema 9. Neurologically Grossly intact, moving all 4 extremities equally  10. MSK: Normal  range of motion     Chart has been reviewed  ______________________________________________________________________________________________  Assessment/Plan  37 y.o. female with medical history significant of asthma HTN, polysubstance abuse  Admitted for   Severe persistent asthma with exacerbation and  Chest pain     Present on Admission:  Asthma exacerbation  Essential hypertension  Polysubstance abuse (HCC)  Tobacco abuse disorder  Obesity, Class III, BMI 40-49.9 (morbid obesity) (HCC)  Chest pain    Asthma exacerbation  -  - Will initiate: Steroid taper  - Albuterol   PRN,    -  Breo or Dulera at discharge   -  Mucinex.  Titrate O2 to saturation >90%. Follow patients respiratory status.   Covid influenza and RSV PCR negative   VBG  Order respiratory panel   If no improvement would need pulmonology consult   Essential hypertension Allow permissive htn for tonight Prn Hydralazine  Polysubstance abuse (HCC) Check urine drug screen as some substances can increase risk for asthma exacerbation  Tobacco abuse disorder  - Spoke about importance of quitting spent 5 minutes discussing options for treatment, prior attempts at quitting, and dangers of smoking  -At this point patient is  interested in quitting  - order nicotine patch   - nursing  tobacco cessation protocol   Obesity, Class III, BMI 40-49.9 (morbid obesity) (HCC) Outpatient nutrition follow-up  Chest pain Appears to be musculoskeletal  Continue supportive management and pain control Troponin unremarkable EKG nonischemic   Other plan as per orders.  DVT prophylaxis:    Lovenox       Code Status:    Code Status: Prior FULL CODE as per patient   I had personally discussed CODE STATUS with patient  ACP   none   Family Communication:   Family at  Bedside  plan of care was discussed   with  Son,  Husband,   Diet heart healthy   Disposition Plan:      To home once workup is complete and patient is stable   Following barriers for discharge:                             Chest pain  is complete                           Asthma improved       Consult Orders  (From admission, onward)           Start     Ordered   01/04/23 2244  Consult to hospitalist  Once       Provider:  (Not yet assigned)  Question Answer Comment  Place call to: [facility]   Reason for Consult Admit      01/04/23 2243              Consults called: none   Admission status:  ED Disposition     ED Disposition  Admit   Condition  --   Comment  Hospital Area: [facility] [[id number]]  Level of Care: Progressive [102]  Admit to Progressive based on following criteria: RESPIRATORY PROBLEMS hypoxemic/hypercapnic respiratory failure that is responsive to NIPPV (BiPAP) or High Flow Nasal Cannula (6-80 lpm). Frequent assessment/intervention, no > Q2 hrs < Q4 hrs, to maintain oxygenation and pulmonary hygiene.  May admit patient to [facility] or [facility] if equivalent level of care is available:: No  Covid Evaluation:  Asymptomatic - no recent exposure (last 10 days) testing not required  Diagnosis: Asthma exacerbation [409811]  Admitting Physician: Therisa Doyne [[id number]]  Attending Physician: Therisa Doyne [[id number]]  Certification:: I certify  this patient will need inpatient services for at least 2 midnights  Expected Medical Readiness: 01/07/2023            inpatient     I Expect 2 midnight stay secondary to severity of patient's current illness need for inpatient interventions justified by the following:  hemodynamic instability despite optimal treatment (tachycardia  )  Severe lab/radiological/exam abnormalities including:   Asthma exacerbation and extensive comorbidities including:    asthma  Morbid Obesity    That are currently affecting medical management.   I expect  patient to be hospitalized for 2 midnights requiring inpatient medical care.  Patient is at high risk for adverse outcome (such as loss of life or disability) if not treated.  Indication for inpatient stay as follows:     persistent chest pain despite medical management    Need for IV fluids, IV  steroids IV pain meds    Level of care        progressive      tele indefinitely please discontinue once patient no longer qualifies COVID-19 Labs    Lab Results  Component Value Date   SARSCOV2NAA NEGATIVE 01/04/2023     Precautions: admitted as   Covid Negative   Jakoby Melendrez 01/04/2023, 11:49 PM    [facility]     after 2 AM please page floor coverage PA If 7AM-7PM, please contact the day team taking care of the patient using [email or web address]

## 2023-01-04 NOTE — ED Notes (Signed)
ED TO INPATIENT HANDOFF REPORT  ED Nurse Name and Phone #: Robbi Garter Name/Age/Gender Barbara Bruce 37 y.o. female Room/Bed: WA14/WA14  Code Status   Code Status: Full Code  Home/SNF/Other Home Patient oriented to: self, place, time, and situation Is this baseline? Yes   Triage Complete: Triage complete  Chief Complaint Asthma exacerbation [J45.901]  Triage Note Pt c/o central chest pain, SOB, and cough x 1 day.  Pain score 10/10.  Hx of asthma.  Pt reports using inhaler w/ some relief.     Allergies No Known Allergies  Level of Care/Admitting Diagnosis ED Disposition     ED Disposition  Admit   Condition  --   Comment  Hospital Area: [facility] [[id number]]  Level of Care: Progressive [102]  Admit to Progressive based on following criteria: RESPIRATORY PROBLEMS hypoxemic/hypercapnic respiratory failure that is responsive to NIPPV (BiPAP) or High Flow Nasal Cannula (6-80 lpm). Frequent assessment/intervention, no > Q2 hrs < Q4 hrs, to maintain oxygenation and pulmonary hygiene.  May admit patient to [facility] or [facility] if equivalent level of care is available:: No  Covid Evaluation: Asymptomatic - no recent exposure (last 10 days) testing not required  Diagnosis: Asthma exacerbation [536644]  Admitting Physician: Therisa Doyne [[id number]]  Attending Physician: Therisa Doyne [[id number]]  Certification:: I certify this patient will need inpatient services for at least 2 midnights  Expected Medical Readiness: 01/07/2023          B Medical/Surgery History Past Medical History:  Diagnosis Date   Asthma    GERD (gastroesophageal reflux disease)    Glands swollen    Per client, in neck   History of anemia    History of blood transfusion    History of breast lump    History of shortness of breath    History of TMJ disorder    History of weight gain    Hypertension    Plantar fasciitis, bilateral    Polysubstance abuse (HCC)     repeatedly positive for cocaine, THC   Ulcer    Past Surgical History:  Procedure Laterality Date   history of tooth extraction     12 teeth removed under general anesthesia in 2020.   infertility       A IV Location/Drains/Wounds Patient Lines/Drains/Airways Status     Active Line/Drains/Airways     Name Placement date Placement time Site Days   Peripheral IV 01/04/23 20 G Right Antecubital 01/04/23  2100  Antecubital  less than 1            Intake/Output Last 24 hours No intake or output data in the 24 hours ending 01/04/23 2307  Labs/Imaging Results for orders placed or performed during the hospital encounter of 01/04/23 (from the past 48 hour(s))  Basic metabolic panel     Status: Abnormal   Collection Time: 01/04/23  7:20 PM  Result Value Ref Range   Sodium 134 (L) 135 - 145 mmol/L   Potassium 3.5 3.5 - 5.1 mmol/L   Chloride 99 98 - 111 mmol/L   CO2 27 22 - 32 mmol/L   Glucose, Bld 163 (H) 70 - 99 mg/dL    Comment: Glucose reference range applies only to samples taken after fasting for at least 8 hours.   BUN 8 6 - 20 mg/dL   Creatinine, Ser 0.34 0.44 - 1.00 mg/dL   Calcium 8.7 (L) 8.9 - 10.3 mg/dL   GFR, Estimated >74 >25 mL/min    Comment: (  NOTE) Calculated using the CKD-EPI Creatinine Equation (2021)    Anion gap 8 5 - 15    Comment: Performed at [facility], [address] [place], [place] [address]  CBC     Status: Abnormal   Collection Time: 01/04/23  7:20 PM  Result Value Ref Range   WBC 14.5 (H) 4.0 - 10.5 K/uL   RBC 4.48 3.87 - 5.11 MIL/uL   Hemoglobin 12.0 12.0 - 15.0 g/dL   HCT 09.8 11.9 - 14.7 %   MCV 87.7 80.0 - 100.0 fL   MCH 26.8 26.0 - 34.0 pg   MCHC 30.5 30.0 - 36.0 g/dL   RDW 82.9 56.2 - 13.0 %   Platelets 281 150 - 400 K/uL   nRBC 0.0 0.0 - 0.2 %    Comment: Performed at [facility], [address] [place], [place] [address]  Troponin I (High Sensitivity)     Status: None   Collection  Time: 01/04/23  7:20 PM  Result Value Ref Range   Troponin I (High Sensitivity) 4 <18 ng/L    Comment: (NOTE) Elevated high sensitivity troponin I (hsTnI) values and significant  changes across serial measurements may suggest ACS but many other  chronic and acute conditions are known to elevate hsTnI results.  Refer to the "Links" section for chest pain algorithms and additional  guidance. Performed at [facility], [address] [place], [place] [address]   hCG, serum, qualitative     Status: None   Collection Time: 01/04/23  7:20 PM  Result Value Ref Range   Preg, Serum NEGATIVE NEGATIVE    Comment:        THE SENSITIVITY OF THIS METHODOLOGY IS >10 mIU/mL. Performed at [facility], [address], [place], [place] [address]   Resp panel by RT-PCR (RSV, Flu A&B, Covid) Anterior Nasal Swab     Status: None   Collection Time: 01/04/23  7:20 PM   Specimen: Anterior Nasal Swab  Result Value Ref Range   SARS Coronavirus 2 by RT PCR NEGATIVE NEGATIVE    Comment: (NOTE) SARS-CoV-2 target nucleic acids are NOT DETECTED.  The SARS-CoV-2 RNA is generally detectable in upper respiratory specimens during the acute phase of infection. The lowest concentration of SARS-CoV-2 viral copies this assay can detect is 138 copies/mL. A negative result does not preclude SARS-Cov-2 infection and should not be used as the sole basis for treatment or other patient management decisions. A negative result may occur with  improper specimen collection/handling, submission of specimen other than nasopharyngeal swab, presence of viral mutation(s) within the areas targeted by this assay, and inadequate number of viral copies(<138 copies/mL). A negative result must be combined with clinical observations, patient history, and epidemiological information. The expected result is Negative.  Fact Sheet for Patients:  BloggerCourse.com  Fact  Sheet for Healthcare Providers:  SeriousBroker.it  This test is no t yet approved or cleared by the [place] FDA and  has been authorized for detection and/or diagnosis of SARS-CoV-2 by FDA under an Emergency Use Authorization (EUA). This EUA will remain  in effect (meaning this test can be used) for the duration of the COVID-19 declaration under Section 564(b)(1) of the Act, 21 U.S.C.section 360bbb-3(b)(1), unless the authorization is terminated  or revoked sooner.       Influenza A by PCR NEGATIVE NEGATIVE   Influenza B by PCR NEGATIVE NEGATIVE    Comment: (NOTE) The Xpert Xpress SARS-CoV-2/FLU/RSV plus assay  is intended as an aid in the diagnosis of influenza from Nasopharyngeal swab specimens and should not be used as a sole basis for treatment. Nasal washings and aspirates are unacceptable for Xpert Xpress SARS-CoV-2/FLU/RSV testing.  Fact Sheet for Patients: BloggerCourse.com  Fact Sheet for Healthcare Providers: SeriousBroker.it  This test is not yet approved or cleared by the [place] FDA and has been authorized for detection and/or diagnosis of SARS-CoV-2 by FDA under an Emergency Use Authorization (EUA). This EUA will remain in effect (meaning this test can be used) for the duration of the COVID-19 declaration under Section 564(b)(1) of the Act, 21 U.S.C. section 360bbb-3(b)(1), unless the authorization is terminated or revoked.     Resp Syncytial Virus by PCR NEGATIVE NEGATIVE    Comment: (NOTE) Fact Sheet for Patients: BloggerCourse.com  Fact Sheet for Healthcare Providers: SeriousBroker.it  This test is not yet approved or cleared by the [place] FDA and has been authorized for detection and/or diagnosis of SARS-CoV-2 by FDA under an Emergency Use Authorization (EUA). This EUA will remain in effect (meaning this test can  be used) for the duration of the COVID-19 declaration under Section 564(b)(1) of the Act, 21 U.S.C. section 360bbb-3(b)(1), unless the authorization is terminated or revoked.  Performed at [facility], [address], [place], [place] [address]   D-dimer, quantitative     Status: None   Collection Time: 01/04/23  8:56 PM  Result Value Ref Range   D-Dimer, Quant <0.27 0.00 - 0.50 ug/mL-FEU    Comment: (NOTE) At the manufacturer cut-off value of 0.5 g/mL FEU, this assay has a negative predictive value of 95-100%.This assay is intended for use in conjunction with a clinical pretest probability (PTP) assessment model to exclude pulmonary embolism (PE) and deep venous thrombosis (DVT) in outpatients suspected of PE or DVT. Results should be correlated with clinical presentation. Performed at [facility], [address] [place], [place] [address]   Troponin I (High Sensitivity)     Status: None   Collection Time: 01/04/23 10:00 PM  Result Value Ref Range   Troponin I (High Sensitivity) 3 <18 ng/L    Comment: (NOTE) Elevated high sensitivity troponin I (hsTnI) values and significant  changes across serial measurements may suggest ACS but many other  chronic and acute conditions are known to elevate hsTnI results.  Refer to the "Links" section for chest pain algorithms and additional  guidance. Performed at [facility], [address], [place], [place] [address]    DG Chest 2 View  Result Date: 01/04/2023 CLINICAL DATA:  Dyspnea and chest pain EXAM: CHEST - 2 VIEW COMPARISON:  12/19/2020 chest radiograph. FINDINGS: Stable cardiomediastinal silhouette with top-normal heart size. No pneumothorax. No pleural effusion. Lungs appear clear, with no acute consolidative airspace disease and no pulmonary edema. IMPRESSION: No active cardiopulmonary disease. Electronically Signed   By: Delbert Phenix M.D.   On: 01/04/2023 19:44     Pending Labs Unresulted Labs (From admission, onward)     Start     Ordered   01/04/23 2259  Rapid urine drug screen (hospital performed)  ONCE - STAT,   STAT        01/04/23 2258   01/04/23 2256  Respiratory (~20 pathogens) panel by PCR  (Respiratory panel by PCR (~20 pathogens, ~24 hr TAT)  w precautions)  Once,   URGENT        01/04/23 2255   01/04/23 2256  Creatinine, urine, random  Once,  URGENT        01/04/23 2255   01/04/23 2256  Phosphorus  Add-on,   AD        01/04/23 2255   01/04/23 2256  Hepatic function panel  Add-on,   AD       Question:  Release to patient  Answer:  Immediate   01/04/23 2255   01/04/23 2255  Blood gas, venous  Once,   R        01/04/23 2254   Signed and Held  HIV Antibody (routine testing w rflx)  (HIV Antibody (Routine testing w reflex) panel)  Once,   R        Signed and Held   Signed and Held  Magnesium  Tomorrow morning,   R        Signed and Held   Signed and Held  Phosphorus  Tomorrow morning,   R        Signed and Held   Signed and Held  Comprehensive metabolic panel  Tomorrow morning,   R       Question:  Release to patient  Answer:  Immediate   Signed and Held   Signed and Held  CBC  Tomorrow morning,   R       Question:  Release to patient  Answer:  Immediate   Signed and Held   Signed and Held  MRSA Next Gen by PCR, Nasal  (COPD / Pneumonia / Cellulitis / Lower Extremity Wound)  Once,   R        Signed and Held            Vitals/Pain Today's Vitals   01/04/23 2115 01/04/23 2200 01/04/23 2220 01/04/23 2230  BP: (!) 163/99 (!) 160/99    Pulse: 99 (!) 104    Resp: (!) 24 (!) 24    Temp:    98 F (36.7 C)  TempSrc:      SpO2: 96% 94%    Weight:      Height:      PainSc:   8      Isolation Precautions Droplet precaution  Medications Medications  ipratropium-albuterol (DUONEB) 0.5-2.5 (3) MG/3ML nebulizer solution 3 mL (has no administration in time range)  albuterol (PROVENTIL) (2.5 MG/3ML) 0.083% nebulizer solution  (7.5 mg/hr Nebulization Given 01/04/23 2042)  magnesium sulfate IVPB 2 g 50 mL (0 g Intravenous Stopped 01/04/23 2237)  methylPREDNISolone sodium succinate (SOLU-MEDROL) 125 mg/2 mL injection 125 mg (125 mg Intravenous Given 01/04/23 2102)  morphine (PF) 4 MG/ML injection 4 mg (4 mg Intravenous Given 01/04/23 2129)  ipratropium-albuterol (DUONEB) 0.5-2.5 (3) MG/3ML nebulizer solution 3 mL (3 mLs Nebulization Given 01/04/23 2219)  ketorolac (TORADOL) 30 MG/ML injection 30 mg (30 mg Intravenous Given 01/04/23 2232)    Mobility walks     Focused Assessments     R Recommendations: See Admitting Provider Note  Report given to:   Additional Notes:

## 2023-01-04 NOTE — Assessment & Plan Note (Signed)
Check urine drug screen as some substances can increase risk for asthma exacerbation Ongoing cocaine abuse is likely worsening asthma exacerbation and can contribute to CP

## 2023-01-04 NOTE — Subjective & Objective (Addendum)
Pt here with chest pain , SOB, cough  Hx of asthma with prior hospitalization   for her asthma.  Inhaler has helped but not fully No back pain no fver

## 2023-01-04 NOTE — Assessment & Plan Note (Addendum)
-  -   Will initiate: Steroid taper  - Albuterol   PRN,    -  Breo or Dulera at discharge   -  Mucinex.  Titrate O2 to saturation >90%. Follow patients respiratory status.   Covid influenza and RSV PCR negative   VBG  Order respiratory panel   If no improvement would need pulmonology consult

## 2023-01-04 NOTE — Assessment & Plan Note (Addendum)
Allow permissive htn for tonight Prn Hydralazine

## 2023-01-04 NOTE — Assessment & Plan Note (Signed)
-  Spoke about importance of quitting spent 5 minutes discussing options for treatment, prior attempts at quitting, and dangers of smoking  -At this point patient is     interested in quitting  - order nicotine patch   - nursing tobacco cessation protocol

## 2023-01-04 NOTE — Assessment & Plan Note (Addendum)
Appears to be musculoskeletal  Continue supportive management and pain control Troponin unremarkable EKG nonischemic

## 2023-01-05 ENCOUNTER — Other Ambulatory Visit: Payer: Self-pay

## 2023-01-05 DIAGNOSIS — R079 Chest pain, unspecified: Secondary | ICD-10-CM | POA: Diagnosis not present

## 2023-01-05 DIAGNOSIS — R0603 Acute respiratory distress: Secondary | ICD-10-CM | POA: Diagnosis not present

## 2023-01-05 DIAGNOSIS — J4521 Mild intermittent asthma with (acute) exacerbation: Secondary | ICD-10-CM | POA: Diagnosis not present

## 2023-01-05 DIAGNOSIS — K219 Gastro-esophageal reflux disease without esophagitis: Secondary | ICD-10-CM

## 2023-01-05 LAB — RESPIRATORY PANEL BY PCR

## 2023-01-05 LAB — COMPREHENSIVE METABOLIC PANEL
ALT: 19 U/L (ref 0–44)
AST: 23 U/L (ref 15–41)
Albumin: 3.9 g/dL (ref 3.5–5.0)
Alkaline Phosphatase: 63 U/L (ref 38–126)
Anion gap: 10 (ref 5–15)
BUN: 9 mg/dL (ref 6–20)
CO2: 23 mmol/L (ref 22–32)
Calcium: 8.3 mg/dL — ABNORMAL LOW (ref 8.9–10.3)
Chloride: 99 mmol/L (ref 98–111)
Creatinine, Ser: 0.67 mg/dL (ref 0.44–1.00)
GFR, Estimated: >60 mL/min (ref >60)
Glucose, Bld: 409 mg/dL — ABNORMAL HIGH (ref 70–99)
Potassium: 3.7 mmol/L (ref 3.5–5.1)
Sodium: 132 mmol/L — ABNORMAL LOW (ref 135–145)
Total Bilirubin: 0.6 mg/dL (ref <1.2)
Total Protein: 8.2 g/dL — ABNORMAL HIGH (ref 6.5–8.1)

## 2023-01-05 LAB — MRSA NEXT GEN BY PCR, NASAL: MRSA by PCR Next Gen: NOT DETECTED

## 2023-01-05 LAB — HIV ANTIBODY (ROUTINE TESTING W REFLEX): HIV Screen 4th Generation wRfx: NONREACTIVE

## 2023-01-05 LAB — CBC
HCT: 40.4 % (ref 36.0–46.0)
Hemoglobin: 12.1 g/dL (ref 12.0–15.0)
MCH: 26.6 pg (ref 26.0–34.0)
MCHC: 30 g/dL (ref 30.0–36.0)
MCV: 88.8 fL (ref 80.0–100.0)
Platelets: 310 10*3/uL (ref 150–400)
RBC: 4.55 MIL/uL (ref 3.87–5.11)
RDW: 14.1 % (ref 11.5–15.5)
WBC: 15.7 10*3/uL — ABNORMAL HIGH (ref 4.0–10.5)
nRBC: 0 % (ref 0.0–0.2)

## 2023-01-05 LAB — MAGNESIUM: Magnesium: 2.5 mg/dL — ABNORMAL HIGH (ref 1.7–2.4)

## 2023-01-05 LAB — HEPATIC FUNCTION PANEL
ALT: 19 U/L (ref 0–44)
AST: 23 U/L (ref 15–41)
Albumin: 3.8 g/dL (ref 3.5–5.0)
Alkaline Phosphatase: 59 U/L (ref 38–126)
Bilirubin, Direct: <0.1 mg/dL (ref 0.0–0.2)
Total Bilirubin: 0.5 mg/dL (ref <1.2)
Total Protein: 8 g/dL (ref 6.5–8.1)

## 2023-01-05 LAB — GLUCOSE, CAPILLARY
Glucose-Capillary: 244 mg/dL — ABNORMAL HIGH (ref 70–99)
Glucose-Capillary: 323 mg/dL — ABNORMAL HIGH (ref 70–99)
Glucose-Capillary: 317 mg/dL — ABNORMAL HIGH (ref 70–99)

## 2023-01-05 LAB — HEMOGLOBIN A1C
Hgb A1c MFr Bld: 7.8 % — ABNORMAL HIGH (ref 4.8–5.6)
Mean Plasma Glucose: 177.16 mg/dL

## 2023-01-05 LAB — RAPID URINE DRUG SCREEN, HOSP PERFORMED
Amphetamines: NOT DETECTED
Barbiturates: NOT DETECTED
Benzodiazepines: NOT DETECTED
Cocaine: POSITIVE — AB
Opiates: POSITIVE — AB
Tetrahydrocannabinol: POSITIVE — AB

## 2023-01-05 LAB — PHOSPHORUS
Phosphorus: 2.1 mg/dL — ABNORMAL LOW (ref 2.5–4.6)
Phosphorus: 2.1 mg/dL — ABNORMAL LOW (ref 2.5–4.6)

## 2023-01-05 LAB — CREATININE, URINE, RANDOM: Creatinine, Urine: 134 mg/dL

## 2023-01-05 MED ORDER — LOSARTAN POTASSIUM 25 MG PO TABS
25.0000 mg | ORAL_TABLET | Freq: Every day | ORAL | Status: DC
  Administered 2023-01-05 – 2023-01-07 (×3): 25 mg via ORAL
  Filled 2023-01-05 (×3): qty 1

## 2023-01-05 MED ORDER — SODIUM CHLORIDE 0.9 % IV SOLN: INTRAVENOUS | Status: DC

## 2023-01-05 MED ORDER — BUDESONIDE 0.5 MG/2ML IN SUSP
0.5000 mg | Freq: Two times a day (BID) | RESPIRATORY_TRACT | Status: DC
  Administered 2023-01-05 – 2023-01-07 (×5): 0.5 mg via RESPIRATORY_TRACT
  Filled 2023-01-05 (×6): qty 2

## 2023-01-05 MED ORDER — SODIUM CHLORIDE 0.9 % IV SOLN: INTRAVENOUS | Status: AC

## 2023-01-05 MED ORDER — LORATADINE 10 MG PO TABS
10.0000 mg | ORAL_TABLET | Freq: Every day | ORAL | Status: DC
  Administered 2023-01-05 – 2023-01-07 (×3): 10 mg via ORAL
  Filled 2023-01-05 (×3): qty 1

## 2023-01-05 MED ORDER — INSULIN ASPART 100 UNIT/ML IJ SOLN
0.0000 [IU] | Freq: Three times a day (TID) | INTRAMUSCULAR | Status: DC
  Administered 2023-01-05: 7 [IU] via SUBCUTANEOUS
  Administered 2023-01-05 – 2023-01-06 (×2): 15 [IU] via SUBCUTANEOUS
  Administered 2023-01-06: 11 [IU] via SUBCUTANEOUS
  Administered 2023-01-06: 15 [IU] via SUBCUTANEOUS
  Administered 2023-01-07: 7 [IU] via SUBCUTANEOUS
  Administered 2023-01-07: 11 [IU] via SUBCUTANEOUS

## 2023-01-05 MED ORDER — GUAIFENESIN ER 600 MG PO TB12
1200.0000 mg | ORAL_TABLET | Freq: Two times a day (BID) | ORAL | Status: DC
  Administered 2023-01-05 – 2023-01-07 (×5): 1200 mg via ORAL
  Filled 2023-01-05 (×5): qty 2

## 2023-01-05 MED ORDER — INSULIN ASPART 100 UNIT/ML IJ SOLN
0.0000 [IU] | Freq: Every day | INTRAMUSCULAR | Status: DC
  Administered 2023-01-05 – 2023-01-06 (×2): 4 [IU] via SUBCUTANEOUS

## 2023-01-05 MED ORDER — K PHOS MONO-SOD PHOS DI & MONO 155-852-130 MG PO TABS
250.0000 mg | ORAL_TABLET | Freq: Two times a day (BID) | ORAL | Status: DC
  Administered 2023-01-05 – 2023-01-07 (×5): 250 mg via ORAL
  Filled 2023-01-05 (×5): qty 1

## 2023-01-05 MED ORDER — PANTOPRAZOLE SODIUM 40 MG PO TBEC
40.0000 mg | DELAYED_RELEASE_TABLET | Freq: Every day | ORAL | Status: DC
  Administered 2023-01-05 – 2023-01-07 (×3): 40 mg via ORAL
  Filled 2023-01-05 (×3): qty 1

## 2023-01-05 MED ORDER — IBUPROFEN 200 MG PO TABS
600.0000 mg | ORAL_TABLET | Freq: Three times a day (TID) | ORAL | Status: DC
  Administered 2023-01-05: 600 mg via ORAL
  Filled 2023-01-05: qty 3

## 2023-01-05 MED ORDER — HYDROCODONE BIT-HOMATROP MBR 5-1.5 MG/5ML PO SOLN
5.0000 mL | Freq: Once | ORAL | Status: AC
  Administered 2023-01-05: 5 mL via ORAL
  Filled 2023-01-05: qty 5

## 2023-01-05 MED ORDER — NICOTINE 14 MG/24HR TD PT24
14.0000 mg | MEDICATED_PATCH | Freq: Every day | TRANSDERMAL | Status: DC
  Administered 2023-01-05 – 2023-01-07 (×3): 14 mg via TRANSDERMAL
  Filled 2023-01-05 (×3): qty 1

## 2023-01-05 MED ORDER — HYDROCODONE BIT-HOMATROP MBR 5-1.5 MG/5ML PO SOLN
5.0000 mL | Freq: Four times a day (QID) | ORAL | Status: DC | PRN
  Administered 2023-01-06 – 2023-01-07 (×3): 5 mL via ORAL
  Filled 2023-01-05 (×3): qty 5

## 2023-01-05 MED ORDER — FLUTICASONE PROPIONATE 50 MCG/ACT NA SUSP
2.0000 | Freq: Every day | NASAL | Status: DC
  Administered 2023-01-05 – 2023-01-07 (×3): 2 via NASAL
  Filled 2023-01-05: qty 16

## 2023-01-05 MED ORDER — ARFORMOTEROL TARTRATE 15 MCG/2ML IN NEBU
15.0000 ug | INHALATION_SOLUTION | Freq: Two times a day (BID) | RESPIRATORY_TRACT | Status: DC
  Administered 2023-01-05 – 2023-01-07 (×5): 15 ug via RESPIRATORY_TRACT
  Filled 2023-01-05 (×6): qty 2

## 2023-01-05 MED ORDER — AMLODIPINE BESYLATE 10 MG PO TABS
5.0000 mg | ORAL_TABLET | Freq: Every day | ORAL | Status: DC
  Administered 2023-01-05 – 2023-01-07 (×3): 5 mg via ORAL
  Filled 2023-01-05 (×3): qty 1

## 2023-01-05 MED ORDER — METHYLPREDNISOLONE SODIUM SUCC 125 MG IJ SOLR
125.0000 mg | Freq: Two times a day (BID) | INTRAMUSCULAR | Status: DC
  Administered 2023-01-05 – 2023-01-07 (×5): 125 mg via INTRAVENOUS
  Filled 2023-01-05 (×5): qty 2

## 2023-01-05 NOTE — Progress Notes (Signed)
PT taken off bipap per pts request and placed on 3L [place].  PT is tolerating well being off bipap.  Sats 94% on 3L [place].  RN made aware, pt states she is hungry.  RT explained to pt that she will not be able to go on bipap for 1-2 hours after eating and pt understands.  No distress noted at this time.

## 2023-01-05 NOTE — Inpatient Diabetes Management (Signed)
Inpatient Diabetes Program Recommendations  AACE/ADA: New Consensus Statement on Inpatient Glycemic Control (2015)  Target Ranges:  Prepandial:   less than 140 mg/dL      Peak postprandial:   less than 180 mg/dL (1-2 hours)      Critically ill patients:  140 - 180 mg/dL   Lab Results  Component Value Date   GLUCAP 125 (H) 05/13/2021   HGBA1C 5.8 (H) 07/14/2018    Review of Glycemic Control  Latest Reference Range & Units 01/05/23 03:53  Glucose 70 - 99 mg/dL 130 (H)   Diabetes history: None  Current orders for Inpatient glycemic control:  Solumedrol 40 mg IV q 12 hours and then Prednisone 40 mg daily Novolog 0-20 units tid with meals and HS  Inpatient Diabetes Program Recommendations:    Agree with the addition of Novolog. A1C pending.   Thanks  Beryl Meager, RN, BC-ADM Inpatient Diabetes Coordinator Pager [phone]  (8a-5p)

## 2023-01-05 NOTE — Progress Notes (Signed)
PROGRESS NOTE    Barbara Bruce  UEA:[id number] DOB: 1985-09-23 DOA: 01/04/2023 PCP: Patient, No Pcp Per    Chief Complaint  Patient presents with   Chest Pain   Shortness of Breath    Brief Narrative:  Patient 37 year old female history of asthma, hypertension, polysubstance abuse presented to the ED with worsening shortness of breath, cough, chest pain with no improvement on inhaler. Patient noted on admission to be in respiratory distress requiring continuous nebulizer, Solu-Medrol, magnesium with clinical improvement.  Patient subsequently admitted for an acute asthma exacerbation.  During hospital day 1 patient noted to be in some respiratory distress and subsequently placed on BiPAP.   Assessment & Plan:   Principal Problem:   Acute respiratory distress Active Problems:   Asthma exacerbation   Essential hypertension   Polysubstance abuse (HCC)   GERD (gastroesophageal reflux disease)   Tobacco abuse disorder   Obesity, Class III, BMI 40-49.9 (morbid obesity) (HCC)   Chest pain   Hypophosphatemia  1 acute respiratory distress secondary to an acute asthma exacerbation -Patient worsening shortness of breath, cough, chest pain associated with cough, no improvement with shortness of breath with inhalers at home noted to be in respiratory distress on presentation to the ED placed on continuous nebulizer treatments IV Solu-Medrol magnesium with some clinical improvement. -Patient states this is her first acute asthma exacerbation this year. -Patient on my assessment this morning with some use of accessory muscles of respiration and some respiratory distress. -Continue DuoNeb. -Start Pulmicort, Brovana, Flonase, Claritin, PPI. -Increase Mucinex to 2 tabs twice daily. -Increase Solu-Medrol to 125 mg IV every 12 hours. -Placed on BiPAP. -Worsens clinically will need pulmonary involvement. -Follow-up.  2.  Hypertension -Start Norvasc 5 mg daily, Cozaar 25 mg and uptitrate for  better blood pressure control.  3.  Tobacco abuse -Tobacco cessation -Nicotine patch.  4.  Polysubstance abuse -UDS positive for opiates, cocaine, THC. -Polysubstance cessation stressed to patient.  5.  Class III obesity -BMI 45.44 kg/m -Lifestyle modification -Outpatient follow-up.  6.  Chest pain -Secondary to problem #1 and coughing. -Patient's pain along the diaphragm and reproducible. -Supportive care.  7.  Hypophosphatemia - KPHOS neutral tab 250mg  BID.  8.  GERD -PPI.   DVT prophylaxis: Lovenox Code Status: Full Family Communication: Updated patient and son at bedside. Disposition: Likely home when clinically improved.  Status is: Inpatient Remains inpatient appropriate because: Severity of illness   Consultants:  None  Procedures:  Chest x-ray 01/04/2023   Antimicrobials:  Anti-infectives (From admission, onward)    None         Subjective: Patient sitting up in bed.  States she feels a little bit better than she did on admission.  Still with some wheezing.  Still with some shortness of breath and not at baseline.  Complaining of upper abdominal/diaphragmatic pain with coughing.  Son sleeping at bedside.  Objective: Vitals:   01/05/23 1122 01/05/23 1134 01/05/23 1141 01/05/23 1229  BP:    (!) 159/95  Pulse:  (!) 101  97  Resp:  18  15  Temp:    97.6 F (36.4 C)  TempSrc:    Oral  SpO2: 99% 97% 97% 99%  Weight:      Height:        Intake/Output Summary (Last 24 hours) at 01/05/2023 1630 Last data filed at 01/05/2023 0900 Gross per 24 hour  Intake 911.93 ml  Output --  Net 911.93 ml   Filed Weights   01/04/23 1848  Weight: 95.3 kg    Examination:  General exam: NAD. Respiratory system: Tight.  Some expiratory wheezing.  Some use of accessory muscles of respiration.  Speaking in some choppy sentences.  Tachypneic.   Cardiovascular system: Tachycardia.  No JVD, no murmurs rubs or gallops.  No lower extremity edema.   Gastrointestinal system: Abdomen is nondistended, soft and nontender. No organomegaly or masses felt. Normal bowel sounds heard. Central nervous system: Alert and oriented. No focal neurological deficits. Extremities: Symmetric 5 x 5 power. Skin: No rashes, lesions or ulcers Psychiatry: Judgement and insight appear normal. Mood & affect appropriate.     Data Reviewed: I have personally reviewed following labs and imaging studies  CBC: Recent Labs  Lab 01/04/23 1920 01/05/23 0353  WBC 14.5* 15.7*  HGB 12.0 12.1  HCT 39.3 40.4  MCV 87.7 88.8  PLT 281 310    Basic Metabolic Panel: Recent Labs  Lab 01/04/23 1920 01/05/23 0353 01/05/23 0354  NA 134* 132*  --   K 3.5 3.7  --   CL 99 99  --   CO2 27 23  --   GLUCOSE 163* 409*  --   BUN 8 9  --   CREATININE 0.54 0.67  --   CALCIUM 8.7* 8.3*  --   MG  --  2.5*  --   PHOS  --  2.1* 2.1*    GFR: Estimated Creatinine Clearance: 93.2 mL/min (by C-G formula based on SCr of 0.67 mg/dL).  Liver Function Tests: Recent Labs  Lab 01/05/23 0353 01/05/23 0354  AST 23 23  ALT 19 19  ALKPHOS 63 59  BILITOT 0.6 0.5  PROT 8.2* 8.0  ALBUMIN 3.9 3.8    CBG: Recent Labs  Lab 01/05/23 1136  GLUCAP 244*     Recent Results (from the past 240 hour(s))  Resp panel by RT-PCR (RSV, Flu A&B, Covid) Anterior Nasal Swab     Status: None   Collection Time: 01/04/23  7:20 PM   Specimen: Anterior Nasal Swab  Result Value Ref Range Status   SARS Coronavirus 2 by RT PCR NEGATIVE NEGATIVE Final    Comment: (NOTE) SARS-CoV-2 target nucleic acids are NOT DETECTED.  The SARS-CoV-2 RNA is generally detectable in upper respiratory specimens during the acute phase of infection. The lowest concentration of SARS-CoV-2 viral copies this assay can detect is 138 copies/mL. A negative result does not preclude SARS-Cov-2 infection and should not be used as the sole basis for treatment or other patient management decisions. A negative result  may occur with  improper specimen collection/handling, submission of specimen other than nasopharyngeal swab, presence of viral mutation(s) within the areas targeted by this assay, and inadequate number of viral copies(<138 copies/mL). A negative result must be combined with clinical observations, patient history, and epidemiological information. The expected result is Negative.  Fact Sheet for Patients:  BloggerCourse.com  Fact Sheet for Healthcare Providers:  SeriousBroker.it  This test is no t yet approved or cleared by the [place] FDA and  has been authorized for detection and/or diagnosis of SARS-CoV-2 by FDA under an Emergency Use Authorization (EUA). This EUA will remain  in effect (meaning this test can be used) for the duration of the COVID-19 declaration under Section 564(b)(1) of the Act, 21 U.S.C.section 360bbb-3(b)(1), unless the authorization is terminated  or revoked sooner.       Influenza A by PCR NEGATIVE NEGATIVE Final   Influenza B by PCR NEGATIVE NEGATIVE Final    Comment: (NOTE) The  Xpert Xpress SARS-CoV-2/FLU/RSV plus assay is intended as an aid in the diagnosis of influenza from Nasopharyngeal swab specimens and should not be used as a sole basis for treatment. Nasal washings and aspirates are unacceptable for Xpert Xpress SARS-CoV-2/FLU/RSV testing.  Fact Sheet for Patients: BloggerCourse.com  Fact Sheet for Healthcare Providers: SeriousBroker.it  This test is not yet approved or cleared by the [place] FDA and has been authorized for detection and/or diagnosis of SARS-CoV-2 by FDA under an Emergency Use Authorization (EUA). This EUA will remain in effect (meaning this test can be used) for the duration of the COVID-19 declaration under Section 564(b)(1) of the Act, 21 U.S.C. section 360bbb-3(b)(1), unless the authorization is terminated  or revoked.     Resp Syncytial Virus by PCR NEGATIVE NEGATIVE Final    Comment: (NOTE) Fact Sheet for Patients: BloggerCourse.com  Fact Sheet for Healthcare Providers: SeriousBroker.it  This test is not yet approved or cleared by the [place] FDA and has been authorized for detection and/or diagnosis of SARS-CoV-2 by FDA under an Emergency Use Authorization (EUA). This EUA will remain in effect (meaning this test can be used) for the duration of the COVID-19 declaration under Section 564(b)(1) of the Act, 21 U.S.C. section 360bbb-3(b)(1), unless the authorization is terminated or revoked.  Performed at [facility], [address], [place], [place] [address]   Respiratory (~20 pathogens) panel by PCR     Status: None   Collection Time: 01/04/23 11:05 PM   Specimen: Nasopharyngeal Swab; Respiratory  Result Value Ref Range Status   Adenovirus NOT DETECTED NOT DETECTED Final   Coronavirus 229E NOT DETECTED NOT DETECTED Final    Comment: (NOTE) The Coronavirus on the Respiratory Panel, DOES NOT test for the novel  Coronavirus (2019 nCoV)    Coronavirus HKU1 NOT DETECTED NOT DETECTED Final   Coronavirus NL63 NOT DETECTED NOT DETECTED Final   Coronavirus OC43 NOT DETECTED NOT DETECTED Final   Metapneumovirus NOT DETECTED NOT DETECTED Final   Rhinovirus / Enterovirus NOT DETECTED NOT DETECTED Final   Influenza A NOT DETECTED NOT DETECTED Final   Influenza B NOT DETECTED NOT DETECTED Final   Parainfluenza Virus 1 NOT DETECTED NOT DETECTED Final   Parainfluenza Virus 2 NOT DETECTED NOT DETECTED Final   Parainfluenza Virus 3 NOT DETECTED NOT DETECTED Final   Parainfluenza Virus 4 NOT DETECTED NOT DETECTED Final   Respiratory Syncytial Virus NOT DETECTED NOT DETECTED Final   Bordetella pertussis NOT DETECTED NOT DETECTED Final   Bordetella Parapertussis NOT DETECTED NOT DETECTED Final   Chlamydophila  pneumoniae NOT DETECTED NOT DETECTED Final   Mycoplasma pneumoniae NOT DETECTED NOT DETECTED Final    Comment: Performed at [facility], [address] [place], [place] [address]  MRSA Next Gen by PCR, Nasal     Status: None   Collection Time: 01/05/23 12:04 AM   Specimen: Urine, Clean Catch; Nasal Swab  Result Value Ref Range Status   MRSA by PCR Next Gen NOT DETECTED NOT DETECTED Final    Comment: (NOTE) The GeneXpert MRSA Assay (FDA approved for NASAL specimens only), is one component of a comprehensive MRSA colonization surveillance program. It is not intended to diagnose MRSA infection nor to guide or monitor treatment for MRSA infections. Test performance is not FDA approved in patients less than 55 years old. Performed at [facility], [address], [place], [place] [address]          Radiology Studies: DG Chest  2 View  Result Date: 01/04/2023 CLINICAL DATA:  Dyspnea and chest pain EXAM: CHEST - 2 VIEW COMPARISON:  12/19/2020 chest radiograph. FINDINGS: Stable cardiomediastinal silhouette with top-normal heart size. No pneumothorax. No pleural effusion. Lungs appear clear, with no acute consolidative airspace disease and no pulmonary edema. IMPRESSION: No active cardiopulmonary disease. Electronically Signed   By: Delbert Phenix M.D.   On: 01/04/2023 19:44        Scheduled Meds:  amLODipine  5 mg Oral Daily   arformoterol  15 mcg Nebulization BID   budesonide (PULMICORT) nebulizer solution  0.5 mg Nebulization BID   enoxaparin (LOVENOX) injection  40 mg Subcutaneous Q24H   fluticasone  2 spray Each Nare Daily   guaiFENesin  1,200 mg Oral BID   insulin aspart  0-20 Units Subcutaneous TID WC   insulin aspart  0-5 Units Subcutaneous QHS   ipratropium-albuterol  3 mL Nebulization Once   ipratropium-albuterol  3 mL Nebulization QID   loratadine  10 mg Oral Daily   losartan  25 mg Oral Daily   methylPREDNISolone (SOLU-MEDROL) injection  125 mg  Intravenous Q12H   nicotine  14 mg Transdermal Daily   pantoprazole  40 mg Oral Daily   phosphorus  250 mg Oral BID   Continuous Infusions:  sodium chloride       LOS: 1 day    Time spent: 45 minutes    Ramiro Harvest, MD [facility]   To contact the attending provider between 7A-7P or the covering provider during after hours 7P-7A, please log into the web site [email or web address] and access using universal [place] password for that web site. If you do not have the password, please call the hospital operator.  01/05/2023, 4:30 PM

## 2023-01-05 NOTE — Progress Notes (Signed)
   01/05/23 1931  BiPAP/CPAP/SIPAP  BiPAP/CPAP/SIPAP Pt Type Adult  Reason BIPAP/CPAP not in use Other(comment)  BiPAP/CPAP /SiPAP Vitals  SpO2 100 %  Bilateral Breath Sounds Expiratory wheezes   Pt stated she doesn't feel like she needs bipap as of right now. RT made pt aware if she feels like she does throughout night to let us know. Bipap on standby @ bedside.

## 2023-01-05 NOTE — TOC Initial Note (Addendum)
Transition of Care [facility]) - Initial/Assessment Note    Patient Details  Name: Barbara Bruce MRN: [id number] Date of Birth: Aug 09, 1985  Transition of [facility]) CM/SW Contact:    Howell Rucks, RN Phone Number: 01/05/2023, 1:19 PM  Clinical Narrative:  Met with pt at bedside to introduce role of TOC/NCM, pt sleeping with Bipap, NT reports pt hasn't gotten much sleep since admission. NCM will complete initial assessment when pt awake and alert.     -4:00pm Met with pt in room to introduce role of TOC/NCM and review for dc planning. Pt reports she does not currently have an established PCP, pt agreeable to NCM scheduling an appointment of one of [facility], pt reports no home DME except a nebulizer, no current home care services, pt reports she feels safe returning home with support from her spouse and family, confirmed transportation is available at discharge. TOC will continue to follow.   -4:10pm Appointment scheduled at : [facility], Wednesday, January 20, 2023 at 2:10pm, added to AVS.                     Patient Goals and CMS Choice            Expected Discharge Plan and Services                                              Prior Living Arrangements/Services                       Activities of Daily Living   ADL Screening (condition at time of admission) Independently performs ADLs?: Yes (appropriate for developmental age) Is the patient deaf or have difficulty hearing?: No Does the patient have difficulty seeing, even when wearing glasses/contacts?: No Does the patient have difficulty concentrating, remembering, or making decisions?: No  Permission Sought/Granted                  Emotional Assessment              Admission diagnosis:  Chest tightness [R07.89] Severe persistent asthma with exacerbation [J45.51] Asthma exacerbation [J45.901] Patient Active Problem List   Diagnosis Date  Noted   Obesity, Class III, BMI 40-49.9 (morbid obesity) (HCC) 01/04/2023   Chest pain 01/04/2023   Asthma exacerbation 10/11/2020   Chronic anemia 10/11/2020   GERD (gastroesophageal reflux disease) 10/11/2020   Tobacco abuse disorder 10/11/2020   Acute respiratory failure with hypoxia (HCC) 10/11/2020   Drug overdose, accidental or unintentional, initial encounter 07/14/2018   Hematemesis 07/14/2018   Elevated lactic acid level 07/14/2018   Breast mass in female 07/14/2018   Hyperglycemia 07/14/2018   Essential hypertension 07/14/2018   Polysubstance abuse (HCC) 07/14/2018   PCP:  Patient, No Pcp Per Pharmacy:   [facility][id number] [place], [place] - [address] AT [facility] OF [address] [place] [address] Phone: [phone] Fax: [phone]  [facility] #[id number] [place], [place] - [address] AT [facility] OF [address] [place] [address] Phone: [phone] Fax: [phone]     Social Determinants of Health (SDOH) Social History: SDOH Screenings   Food Insecurity: No Food Insecurity (01/05/2023)  Housing: Low Risk  (01/05/2023)  Transportation Needs: No  Transportation Needs (01/05/2023)  Utilities: Not At Risk (01/05/2023)  Depression (PHQ2-9): Low Risk  (03/01/2020)  Tobacco Use: High Risk (01/04/2023)   SDOH Interventions:     Readmission Risk Interventions     No data to display

## 2023-01-05 NOTE — Plan of Care (Signed)
  Problem: Education: Goal: Knowledge of General Education information will improve Description: Including pain rating scale, medication(s)/side effects and non-pharmacologic comfort measures Outcome: Progressing   Problem: Health Behavior/Discharge Planning: Goal: Ability to manage health-related needs will improve Outcome: Progressing   Problem: Clinical Measurements: Goal: Ability to maintain clinical measurements within normal limits will improve Outcome: Progressing Goal: Will remain free from infection Outcome: Progressing Goal: Diagnostic test results will improve Outcome: Progressing Goal: Respiratory complications will improve Outcome: Progressing Goal: Cardiovascular complication will be avoided Outcome: Progressing   Problem: Activity: Goal: Risk for activity intolerance will decrease Outcome: Progressing   Problem: Nutrition: Goal: Adequate nutrition will be maintained Outcome: Progressing   Problem: Coping: Goal: Level of anxiety will decrease Outcome: Progressing   Problem: Elimination: Goal: Will not experience complications related to bowel motility Outcome: Progressing Goal: Will not experience complications related to urinary retention Outcome: Progressing   Problem: Pain Management: Goal: General experience of comfort will improve Outcome: Progressing   Problem: Safety: Goal: Ability to remain free from injury will improve Outcome: Progressing   Problem: Skin Integrity: Goal: Risk for impaired skin integrity will decrease Outcome: Progressing   Problem: Education: Goal: Knowledge of disease or condition will improve Outcome: Progressing Goal: Knowledge of the prescribed therapeutic regimen will improve Outcome: Progressing Goal: Individualized Educational Video(s) Outcome: Progressing   Problem: Activity: Goal: Ability to tolerate increased activity will improve Outcome: Progressing Goal: Will verbalize the importance of balancing  activity with adequate rest periods Outcome: Progressing   Problem: Respiratory: Goal: Ability to maintain a clear airway will improve Outcome: Progressing Goal: Levels of oxygenation will improve Outcome: Progressing Goal: Ability to maintain adequate ventilation will improve Outcome: Progressing   Problem: Education: Goal: Ability to describe self-care measures that may prevent or decrease complications (Diabetes Survival Skills Education) will improve Outcome: Progressing Goal: Individualized Educational Video(s) Outcome: Progressing   Problem: Coping: Goal: Ability to adjust to condition or change in health will improve Outcome: Progressing   Problem: Fluid Volume: Goal: Ability to maintain a balanced intake and output will improve Outcome: Progressing   Problem: Health Behavior/Discharge Planning: Goal: Ability to identify and utilize available resources and services will improve Outcome: Progressing Goal: Ability to manage health-related needs will improve Outcome: Progressing   Problem: Metabolic: Goal: Ability to maintain appropriate glucose levels will improve Outcome: Progressing   Problem: Nutritional: Goal: Maintenance of adequate nutrition will improve Outcome: Progressing Goal: Progress toward achieving an optimal weight will improve Outcome: Progressing   Problem: Skin Integrity: Goal: Risk for impaired skin integrity will decrease Outcome: Progressing   Problem: Tissue Perfusion: Goal: Adequacy of tissue perfusion will improve Outcome: Progressing

## 2023-01-05 NOTE — Progress Notes (Signed)
Pt given neb tx ordered and started on bipap per MD order.  Pt is tolerating well.

## 2023-01-06 DIAGNOSIS — J4521 Mild intermittent asthma with (acute) exacerbation: Secondary | ICD-10-CM | POA: Diagnosis not present

## 2023-01-06 DIAGNOSIS — K219 Gastro-esophageal reflux disease without esophagitis: Secondary | ICD-10-CM | POA: Diagnosis not present

## 2023-01-06 DIAGNOSIS — R0603 Acute respiratory distress: Secondary | ICD-10-CM | POA: Diagnosis not present

## 2023-01-06 LAB — CBC WITH DIFFERENTIAL/PLATELET
Abs Immature Granulocytes: 0.12 10*3/uL — ABNORMAL HIGH (ref 0.00–0.07)
Basophils Absolute: 0 10*3/uL (ref 0.0–0.1)
Basophils Relative: 0 %
Eosinophils Absolute: 0 10*3/uL (ref 0.0–0.5)
Eosinophils Relative: 0 %
HCT: 36.1 % (ref 36.0–46.0)
Hemoglobin: 11.2 g/dL — ABNORMAL LOW (ref 12.0–15.0)
Immature Granulocytes: 1 %
Lymphocytes Relative: 10 %
Lymphs Abs: 1.6 10*3/uL (ref 0.7–4.0)
MCH: 27.1 pg (ref 26.0–34.0)
MCHC: 31 g/dL (ref 30.0–36.0)
MCV: 87.2 fL (ref 80.0–100.0)
Monocytes Absolute: 0.6 10*3/uL (ref 0.1–1.0)
Monocytes Relative: 4 %
Neutro Abs: 13.2 10*3/uL — ABNORMAL HIGH (ref 1.7–7.7)
Neutrophils Relative %: 85 %
Platelets: 287 10*3/uL (ref 150–400)
RBC: 4.14 MIL/uL (ref 3.87–5.11)
RDW: 14.5 % (ref 11.5–15.5)
WBC: 15.5 10*3/uL — ABNORMAL HIGH (ref 4.0–10.5)
nRBC: 0 % (ref 0.0–0.2)

## 2023-01-06 LAB — BASIC METABOLIC PANEL
Anion gap: 7 (ref 5–15)
BUN: 11 mg/dL (ref 6–20)
CO2: 25 mmol/L (ref 22–32)
Calcium: 9 mg/dL (ref 8.9–10.3)
Chloride: 103 mmol/L (ref 98–111)
Creatinine, Ser: 0.57 mg/dL (ref 0.44–1.00)
GFR, Estimated: >60 mL/min (ref >60)
Glucose, Bld: 287 mg/dL — ABNORMAL HIGH (ref 70–99)
Potassium: 4.2 mmol/L (ref 3.5–5.1)
Sodium: 135 mmol/L (ref 135–145)

## 2023-01-06 LAB — MAGNESIUM: Magnesium: 2.3 mg/dL (ref 1.7–2.4)

## 2023-01-06 LAB — GLUCOSE, CAPILLARY
Glucose-Capillary: 266 mg/dL — ABNORMAL HIGH (ref 70–99)
Glucose-Capillary: 301 mg/dL — ABNORMAL HIGH (ref 70–99)
Glucose-Capillary: 324 mg/dL — ABNORMAL HIGH (ref 70–99)
Glucose-Capillary: 314 mg/dL — ABNORMAL HIGH (ref 70–99)

## 2023-01-06 LAB — PHOSPHORUS: Phosphorus: 2.2 mg/dL — ABNORMAL LOW (ref 2.5–4.6)

## 2023-01-06 MED ORDER — LIVING WELL WITH DIABETES BOOK
Freq: Once | Status: AC
  Filled 2023-01-06 (×2): qty 1

## 2023-01-06 NOTE — Progress Notes (Signed)
Bipap on standby

## 2023-01-06 NOTE — Plan of Care (Signed)

## 2023-01-06 NOTE — Progress Notes (Signed)
PROGRESS NOTE    Barbara Bruce  BJY:[id number] DOB: 1985-06-14 DOA: 01/04/2023 PCP: Patient, No Pcp Per    Brief Narrative:   Patient is a 37 year old female history of asthma, hypertension, polysubstance abuse presented to the ED with worsening shortness of breath, cough, chest pain with no improvement on inhaler.  In the ED, patient was noted to be in respiratory distress and received continuous nebulizer, Solu-Medrol, magnesium with clinical improvement.  Patient subsequently admitted for an acute asthma exacerbation due to ongoing respiratory distress was subsequently placed on BiPAP.    Assessment & Plan:   Principal Problem:   Acute respiratory distress Active Problems:   Asthma exacerbation   Essential hypertension   Polysubstance abuse (HCC)   GERD (gastroesophageal reflux disease)   Tobacco abuse disorder   Obesity, Class III, BMI 40-49.9 (morbid obesity) (HCC)   Chest pain   Hypophosphatemia  Acute respiratory distress secondary to an acute asthma exacerbation Chest pain. First exacerbation this year.  Chest x-ray was negative for infiltrate.  Continue DuoNeb, Pulmicort, Brovana, Flonase, Claritin, PPI.  Continue Mucinex.  Solu-Medrol dose has been increased to 125 mg twice daily since yesterday.  Received BiPAP.  Currently off BiPAP.  Feels little better today.    Chest pain secondary to coughing. Symptomatic treatment.  Continue antitussives  Essential hypertension Not on medications at home.  Has been started on Norvasc 5 and Cozaar 25 mg during hospitalization.  Patient seems to have improved.  Tobacco abuse Continue nicotine patch.  Counseled about quitting smoking especially with respiratory issues.  Polysubstance abuse -UDS positive for opiates, cocaine, THC. Reemphasized quitting.   Class III obesity -BMI 45.44 kg/m.  Patient would benefit from weight loss as outpatient.   Hypophosphatemia Phosphorus level was 2.2 this morning.  On  K-Phos Neutral tab  twice daily.    GERD Continue PPI.   DVT prophylaxis: Lovenox subcu  Code Status: Full  Family Communication:   Patient has been sleeping at bedside.  Son at bedside.  Disposition: Likely home when clinically improved likely by 12/30/2022  Status is: Inpatient Remains inpatient appropriate because: Severity of illness, pending clinical improvement   Consultants:  None  Procedures:  Chest x-ray 01/04/2023 BiPAP placement   Antimicrobials:  None    Subjective: Today, patient was seen and examined at bedside.  Patient states that she does have some chest pain and cough and some wheezing but feels better than yesterday.  Had increased dose of Solu-Medrol and was put on BiPAP yesterday.  Objective: Vitals:   01/05/23 1931 01/05/23 1948 01/06/23 0428 01/06/23 0753  BP:  (!) 162/81 137/81   Pulse:  (!) 104 94   Resp:  20 20   Temp:  98.2 F (36.8 C) 98.1 F (36.7 C)   TempSrc:  Oral Oral   SpO2: 100% 100% 95% 96%  Weight:      Height:        Intake/Output Summary (Last 24 hours) at 01/06/2023 0758 Last data filed at 01/06/2023 0300 Gross per 24 hour  Intake 2202.03 ml  Output 1000 ml  Net 1202.03 ml   Filed Weights   01/04/23 1848  Weight: 95.3 kg    Physical examination:  Body mass index is 45.44 kg/m.  General: Morbidly obese built, on oxygen, alert awake and Communicative,  HENT:   No scleral pallor or icterus noted. Oral mucosa is moist.  Chest: Decreased breath sounds bilaterally, prolonged expiration, mild wheezes. CVS: S1 &S2 heard. No murmur.  Regular rate and  rhythm. Abdomen: Soft, nontender, obese abdomen bowel sounds are heard.   Extremities: No cyanosis, clubbing or edema.  Peripheral pulses are palpable. Psych: Alert, awake and oriented, normal mood CNS:  No cranial nerve deficits.  Power equal in all extremities.   Skin: Warm and dry.  No rashes noted.   Data Reviewed: I have personally reviewed following labs and imaging  studies  CBC: Recent Labs  Lab 01/04/23 1920 01/05/23 0353 01/06/23 0414  WBC 14.5* 15.7* 15.5*  NEUTROABS  --   --  13.2*  HGB 12.0 12.1 11.2*  HCT 39.3 40.4 36.1  MCV 87.7 88.8 87.2  PLT 281 310 287    Basic Metabolic Panel: Recent Labs  Lab 01/04/23 1920 01/05/23 0353 01/05/23 0354 01/06/23 0414  NA 134* 132*  --  135  K 3.5 3.7  --  4.2  CL 99 99  --  103  CO2 27 23  --  25  GLUCOSE 163* 409*  --  287*  BUN 8 9  --  11  CREATININE 0.54 0.67  --  0.57  CALCIUM 8.7* 8.3*  --  9.0  MG  --  2.5*  --  2.3  PHOS  --  2.1* 2.1* 2.2*    GFR: Estimated Creatinine Clearance: 93.2 mL/min (by C-G formula based on SCr of 0.57 mg/dL).  Liver Function Tests: Recent Labs  Lab 01/05/23 0353 01/05/23 0354  AST 23 23  ALT 19 19  ALKPHOS 63 59  BILITOT 0.6 0.5  PROT 8.2* 8.0  ALBUMIN 3.9 3.8    CBG: Recent Labs  Lab 01/05/23 1136 01/05/23 1704 01/05/23 2125  GLUCAP 244* 323* 317*     Recent Results (from the past 240 hour(s))  Resp panel by RT-PCR (RSV, Flu A&B, Covid) Anterior Nasal Swab     Status: None   Collection Time: 01/04/23  7:20 PM   Specimen: Anterior Nasal Swab  Result Value Ref Range Status   SARS Coronavirus 2 by RT PCR NEGATIVE NEGATIVE Final    Comment: (NOTE) SARS-CoV-2 target nucleic acids are NOT DETECTED.  The SARS-CoV-2 RNA is generally detectable in upper respiratory specimens during the acute phase of infection. The lowest concentration of SARS-CoV-2 viral copies this assay can detect is 138 copies/mL. A negative result does not preclude SARS-Cov-2 infection and should not be used as the sole basis for treatment or other patient management decisions. A negative result may occur with  improper specimen collection/handling, submission of specimen other than nasopharyngeal swab, presence of viral mutation(s) within the areas targeted by this assay, and inadequate number of viral copies(<138 copies/mL). A negative result must be  combined with clinical observations, patient history, and epidemiological information. The expected result is Negative.  Fact Sheet for Patients:  BloggerCourse.com  Fact Sheet for Healthcare Providers:  SeriousBroker.it  This test is no t yet approved or cleared by the [place] FDA and  has been authorized for detection and/or diagnosis of SARS-CoV-2 by FDA under an Emergency Use Authorization (EUA). This EUA will remain  in effect (meaning this test can be used) for the duration of the COVID-19 declaration under Section 564(b)(1) of the Act, 21 U.S.C.section 360bbb-3(b)(1), unless the authorization is terminated  or revoked sooner.       Influenza A by PCR NEGATIVE NEGATIVE Final   Influenza B by PCR NEGATIVE NEGATIVE Final    Comment: (NOTE) The Xpert Xpress SARS-CoV-2/FLU/RSV plus assay is intended as an aid in the diagnosis of influenza from Nasopharyngeal swab  specimens and should not be used as a sole basis for treatment. Nasal washings and aspirates are unacceptable for Xpert Xpress SARS-CoV-2/FLU/RSV testing.  Fact Sheet for Patients: BloggerCourse.com  Fact Sheet for Healthcare Providers: SeriousBroker.it  This test is not yet approved or cleared by the [place] FDA and has been authorized for detection and/or diagnosis of SARS-CoV-2 by FDA under an Emergency Use Authorization (EUA). This EUA will remain in effect (meaning this test can be used) for the duration of the COVID-19 declaration under Section 564(b)(1) of the Act, 21 U.S.C. section 360bbb-3(b)(1), unless the authorization is terminated or revoked.     Resp Syncytial Virus by PCR NEGATIVE NEGATIVE Final    Comment: (NOTE) Fact Sheet for Patients: BloggerCourse.com  Fact Sheet for Healthcare Providers: SeriousBroker.it  This test is not yet  approved or cleared by the [place] FDA and has been authorized for detection and/or diagnosis of SARS-CoV-2 by FDA under an Emergency Use Authorization (EUA). This EUA will remain in effect (meaning this test can be used) for the duration of the COVID-19 declaration under Section 564(b)(1) of the Act, 21 U.S.C. section 360bbb-3(b)(1), unless the authorization is terminated or revoked.  Performed at [facility], [address], [place], [place] [address]   Respiratory (~20 pathogens) panel by PCR     Status: None   Collection Time: 01/04/23 11:05 PM   Specimen: Nasopharyngeal Swab; Respiratory  Result Value Ref Range Status   Adenovirus NOT DETECTED NOT DETECTED Final   Coronavirus 229E NOT DETECTED NOT DETECTED Final    Comment: (NOTE) The Coronavirus on the Respiratory Panel, DOES NOT test for the novel  Coronavirus (2019 nCoV)    Coronavirus HKU1 NOT DETECTED NOT DETECTED Final   Coronavirus NL63 NOT DETECTED NOT DETECTED Final   Coronavirus OC43 NOT DETECTED NOT DETECTED Final   Metapneumovirus NOT DETECTED NOT DETECTED Final   Rhinovirus / Enterovirus NOT DETECTED NOT DETECTED Final   Influenza A NOT DETECTED NOT DETECTED Final   Influenza B NOT DETECTED NOT DETECTED Final   Parainfluenza Virus 1 NOT DETECTED NOT DETECTED Final   Parainfluenza Virus 2 NOT DETECTED NOT DETECTED Final   Parainfluenza Virus 3 NOT DETECTED NOT DETECTED Final   Parainfluenza Virus 4 NOT DETECTED NOT DETECTED Final   Respiratory Syncytial Virus NOT DETECTED NOT DETECTED Final   Bordetella pertussis NOT DETECTED NOT DETECTED Final   Bordetella Parapertussis NOT DETECTED NOT DETECTED Final   Chlamydophila pneumoniae NOT DETECTED NOT DETECTED Final   Mycoplasma pneumoniae NOT DETECTED NOT DETECTED Final    Comment: Performed at [facility], [address], [place], [place] [address]  MRSA Next Gen by PCR, Nasal     Status: None   Collection Time: 01/05/23 12:04 AM    Specimen: Urine, Clean Catch; Nasal Swab  Result Value Ref Range Status   MRSA by PCR Next Gen NOT DETECTED NOT DETECTED Final    Comment: (NOTE) The GeneXpert MRSA Assay (FDA approved for NASAL specimens only), is one component of a comprehensive MRSA colonization surveillance program. It is not intended to diagnose MRSA infection nor to guide or monitor treatment for MRSA infections. Test performance is not FDA approved in patients less than 76 years old. Performed at [facility], [address] [place], [place] [address]       Radiology Studies: DG Chest 2 View  Result Date: 01/04/2023 CLINICAL DATA:  Dyspnea and chest pain EXAM: CHEST - 2 VIEW COMPARISON:  12/19/2020  chest radiograph. FINDINGS: Stable cardiomediastinal silhouette with top-normal heart size. No pneumothorax. No pleural effusion. Lungs appear clear, with no acute consolidative airspace disease and no pulmonary edema. IMPRESSION: No active cardiopulmonary disease. Electronically Signed   By: Delbert Phenix M.D.   On: 01/04/2023 19:44     Scheduled Meds:  amLODipine  5 mg Oral Daily   arformoterol  15 mcg Nebulization BID   budesonide (PULMICORT) nebulizer solution  0.5 mg Nebulization BID   enoxaparin (LOVENOX) injection  40 mg Subcutaneous Q24H   fluticasone  2 spray Each Nare Daily   guaiFENesin  1,200 mg Oral BID   insulin aspart  0-20 Units Subcutaneous TID WC   insulin aspart  0-5 Units Subcutaneous QHS   ipratropium-albuterol  3 mL Nebulization Once   ipratropium-albuterol  3 mL Nebulization QID   loratadine  10 mg Oral Daily   losartan  25 mg Oral Daily   methylPREDNISolone (SOLU-MEDROL) injection  125 mg Intravenous Q12H   nicotine  14 mg Transdermal Daily   pantoprazole  40 mg Oral Daily   phosphorus  250 mg Oral BID   Continuous Infusions:  sodium chloride 100 mL/hr at 01/06/23 0144     LOS: 2 days     Joycelyn Das, MD [facility] 01/06/2023, 7:58 AM

## 2023-01-06 NOTE — Inpatient Diabetes Management (Addendum)
Inpatient Diabetes Program Recommendations  AACE/ADA: New Consensus Statement on Inpatient Glycemic Control   Target Ranges:  Prepandial:   less than 140 mg/dL      Peak postprandial:   less than 180 mg/dL (1-2 hours)      Critically ill patients:  140 - 180 mg/dL    Latest Reference Range & Units 01/05/23 11:36 01/05/23 17:04 01/05/23 21:25 01/06/23 08:01  Glucose-Capillary 70 - 99 mg/dL 409 (H) 811 (H) 914 (H) 266 (H)    Latest Reference Range & Units 07/14/18 18:00 01/05/23 03:53  Hemoglobin A1C 4.8 - 5.6 % 5.8 (H) 7.8 (H)   Review of Glycemic Control  Diabetes history: No Outpatient Diabetes medications: NA Current orders for Inpatient glycemic control: Novolog 0-20 units TID with meals, Novolog 0-5 units at bedtime; Solumedrol 125 mg Q12H  Inpatient Diabetes Program Recommendations:    Insulin: If Solumedrol is continued as ordered, please consider ordering Semglee 8 units Q24H.  HbgA1C:  A1C 7.8% on 01/05/23. If patient will be newly dx with DM, please inform patient and bedside nursing so patient can be educated on DM while inpatient, and consult inpatient diabetes coordinator.   Outpatient DM: Please provide Rx for glucose monitoring kit (#[id number]) at discharge.  NOTE: Patient admitted with asthma exacerbation. Initial lab glucose 163 mg/dl on 13/08/65 at 78:46. Per chart, no DM hx noted. Last A1C was 5.8% on 07/14/18 and current A1C 7.8% on 01/05/23 which meets criteria for DM dx. If MD will be diagnosing with DM this admission, please inform patient and bedside nursing staff so patient can be educated and consult diabetes coordinator.  Addendum 01/06/23 @ 12:35-Spoke with patient about new diabetes diagnosis.  Patient states that she has no prior DM hx and never been told she had DM or preDM in the past. Patient has insurance but no PCP.  Patient states that her father has DM and multiple health issues so a new dx of DM has been weighing heavy on her.  Patient is eager to learn  as much as she can about DM and what she can do to keep it controlled and stay healthy.  Discussed A1C results (7.8% on 01/05/23) and explained what an A1C is and informed patient that current A1C indicates an average glucose of 177 mg/dl over the past 2-3 months. Discussed basic pathophysiology of DM Type 2, basic home care, importance of checking CBGs and maintaining good CBG control to prevent long-term and short-term complications. Reviewed glucose and A1C goals.  Reviewed signs and symptoms of hyperglycemia and hypoglycemia along with treatment for both. Discussed impact of nutrition, exercise, stress, sickness, and medications on diabetes control. Patient states that she drinks 6-7 regular sodas per day and she has not monitored carbohydrates in diet. Discussed Carb Modified diet, portion control, eating carbohydrates in moderation, and encouraged to eliminate sugary beverages completely.  Discussed impact of steroids on glycemic control. Informed patient that a Living Well with diabetes booklet was ordered and encouraged patient to read through entire book on received. Informed patient that she may be prescribed oral DM medication at discharge. Patient states that she has checked her father's glucose and has also given him insulin so she is familiar with how to check glucose.  Asked patient to check glucose as directed at discharge and to keep a log book of glucose readings. Explained how the doctor she follows up with can use the log book to continue to make adjustments with DM medications if needed. Patient reports that she is  interested in losing weight and her grandmother takes Ozmepic and had great results. Patient plans to talk with new PCP about possibly starting GLP-1 medication.  Patient reports that [facility] has made an appointment with a clinic for her to establish care with. Stressed importance to establish care with PCP and follow up consistently.  Patient verbalized understanding of information  discussed and she states that she has no further questions at this time related to diabetes.   RNs to provide ongoing basic DM education at bedside with this patient.   Thanks, Orlando Penner, RN, MSN, CDCES Diabetes Coordinator Inpatient Diabetes Program [phone] (Team Pager from 8am to 5pm)

## 2023-01-07 DIAGNOSIS — J4521 Mild intermittent asthma with (acute) exacerbation: Secondary | ICD-10-CM | POA: Diagnosis not present

## 2023-01-07 DIAGNOSIS — F1910 Other psychoactive substance abuse, uncomplicated: Secondary | ICD-10-CM | POA: Diagnosis not present

## 2023-01-07 DIAGNOSIS — R0603 Acute respiratory distress: Secondary | ICD-10-CM | POA: Diagnosis not present

## 2023-01-07 LAB — GLUCOSE, CAPILLARY
Glucose-Capillary: 242 mg/dL — ABNORMAL HIGH (ref 70–99)
Glucose-Capillary: 300 mg/dL — ABNORMAL HIGH (ref 70–99)

## 2023-01-07 MED ORDER — FLUTICASONE PROPIONATE HFA 110 MCG/ACT IN AERO: 2.0000 | INHALATION_SPRAY | Freq: Two times a day (BID) | RESPIRATORY_TRACT | 2 refills | Status: AC

## 2023-01-07 MED ORDER — PREDNISONE 10 MG PO TABS: 30.0000 mg | ORAL_TABLET | Freq: Every day | ORAL | 0 refills | Status: AC

## 2023-01-07 MED ORDER — ALBUTEROL SULFATE HFA 108 (90 BASE) MCG/ACT IN AERS
2.0000 | INHALATION_SPRAY | RESPIRATORY_TRACT | 2 refills | Status: AC | PRN
Start: 2023-01-07 — End: ?

## 2023-01-07 MED ORDER — LORATADINE 10 MG PO TABS: 10.0000 mg | ORAL_TABLET | Freq: Every day | ORAL | 0 refills | Status: AC

## 2023-01-07 MED ORDER — BLOOD GLUCOSE MONITORING SUPPL DEVI: 1.0000 | Freq: Three times a day (TID) | 0 refills | Status: AC

## 2023-01-07 MED ORDER — BLOOD GLUCOSE TEST VI STRP: 1.0000 | ORAL_STRIP | Freq: Three times a day (TID) | 0 refills | Status: AC

## 2023-01-07 MED ORDER — LANCET DEVICE MISC: 1.0000 | Freq: Three times a day (TID) | 0 refills | Status: AC

## 2023-01-07 MED ORDER — IPRATROPIUM-ALBUTEROL 0.5-2.5 (3) MG/3ML IN SOLN
3.0000 mL | Freq: Three times a day (TID) | RESPIRATORY_TRACT | Status: DC
  Administered 2023-01-07: 3 mL via RESPIRATORY_TRACT
  Filled 2023-01-07: qty 3

## 2023-01-07 MED ORDER — ALBUTEROL SULFATE (2.5 MG/3ML) 0.083% IN NEBU: 2.5000 mg | INHALATION_SOLUTION | Freq: Four times a day (QID) | RESPIRATORY_TRACT | 12 refills | Status: DC | PRN

## 2023-01-07 MED ORDER — GUAIFENESIN ER 600 MG PO TB12: 1200.0000 mg | ORAL_TABLET | Freq: Two times a day (BID) | ORAL | 0 refills | Status: AC

## 2023-01-07 MED ORDER — HYDROCODONE-ACETAMINOPHEN 5-325 MG PO TABS: 1.0000 | ORAL_TABLET | Freq: Four times a day (QID) | ORAL | 0 refills | Status: DC | PRN

## 2023-01-07 MED ORDER — KETOROLAC TROMETHAMINE 10 MG PO TABS: 10.0000 mg | ORAL_TABLET | Freq: Four times a day (QID) | ORAL | 0 refills | Status: DC | PRN

## 2023-01-07 MED ORDER — NAPROXEN 375 MG PO TABS: ORAL_TABLET | ORAL | 0 refills | Status: DC

## 2023-01-07 MED ORDER — METHOCARBAMOL 750 MG PO TABS: 750.0000 mg | ORAL_TABLET | Freq: Four times a day (QID) | ORAL | 0 refills | Status: AC | PRN

## 2023-01-07 MED ORDER — NICOTINE 14 MG/24HR TD PT24: 14.0000 mg | MEDICATED_PATCH | Freq: Every day | TRANSDERMAL | 0 refills | Status: AC

## 2023-01-07 MED ORDER — LANCETS MISC. MISC: 1.0000 | Freq: Three times a day (TID) | 0 refills | Status: AC

## 2023-01-07 MED ORDER — AMLODIPINE BESYLATE 5 MG PO TABS: 5.0000 mg | ORAL_TABLET | Freq: Every day | ORAL | 2 refills | Status: DC

## 2023-01-07 MED ORDER — LOSARTAN POTASSIUM 25 MG PO TABS: 25.0000 mg | ORAL_TABLET | Freq: Every day | ORAL | 2 refills | Status: DC

## 2023-01-07 MED ORDER — METFORMIN HCL 850 MG PO TABS: 850.0000 mg | ORAL_TABLET | Freq: Two times a day (BID) | ORAL | 0 refills | Status: DC

## 2023-01-07 NOTE — Progress Notes (Signed)
AVS reviewed w/ pt who verbalized an understanding - no other questions at this time. PIV removed as noted- pt dressing for d/c to home. Neb machine will be delivered to home once  pt picks up nebs at pharmacy.

## 2023-01-07 NOTE — TOC Transition Note (Signed)
Transition of Care [facility]) - CM/SW Discharge Note   Patient Details  Name: Barbara Bruce MRN: [id number] Date of Birth: 1985-09-08  Transition of [facility] CM/SW Contact:  Otelia Santee, LCSW Phone Number: 01/07/2023, 10:34 AM   Clinical Narrative:    Pt requiring nebulizer machine at discharge. DME has been ordered through [employer] who will be delivering machine to pt's home once she has picked up nebulizer treatments. Pt to call [employer] driver, Shawn at [phone] to have machine delivered to home. Pt informed and agreeable to this plan. Information placed in AVS.    Final next level of care: Home/Self Care Barriers to Discharge: No Barriers Identified   Patient Goals and CMS Choice CMS Medicare.gov Compare Post Acute Care list provided to::  (NA) Choice offered to / list presented to : Patient  Discharge Placement                         Discharge Plan and Services Additional resources added to the After Visit Summary for                  DME Arranged: Nebulizer machine DME Agency: Patsy Lager Date DME Agency Contacted: 01/07/23 Time DME Agency Contacted: 1034 Representative spoke with at DME Agency: Morrie Sheldon            Social Determinants of Health (SDOH) Interventions SDOH Screenings   Food Insecurity: No Food Insecurity (01/05/2023)  Housing: Low Risk  (01/05/2023)  Transportation Needs: No Transportation Needs (01/05/2023)  Utilities: Not At Risk (01/05/2023)  Depression (PHQ2-9): Low Risk  (03/01/2020)  Tobacco Use: High Risk (01/04/2023)     Readmission Risk Interventions    01/05/2023    4:07 PM  Readmission Risk Prevention Plan  Post Dischage Appt Complete  Medication Screening Complete  Transportation Screening Complete

## 2023-01-07 NOTE — Plan of Care (Signed)
  Problem: Education: Goal: Knowledge of General Education information will improve Description: Including pain rating scale, medication(s)/side effects and non-pharmacologic comfort measures Outcome: Progressing   Problem: Health Behavior/Discharge Planning: Goal: Ability to manage health-related needs will improve Outcome: Progressing   Problem: Clinical Measurements: Goal: Ability to maintain clinical measurements within normal limits will improve Outcome: Progressing Goal: Will remain free from infection Outcome: Progressing Goal: Diagnostic test results will improve Outcome: Progressing Goal: Respiratory complications will improve Outcome: Progressing Goal: Cardiovascular complication will be avoided Outcome: Progressing   Problem: Activity: Goal: Risk for activity intolerance will decrease Outcome: Progressing   Problem: Nutrition: Goal: Adequate nutrition will be maintained Outcome: Progressing   Problem: Coping: Goal: Level of anxiety will decrease Outcome: Progressing   Problem: Elimination: Goal: Will not experience complications related to bowel motility Outcome: Progressing Goal: Will not experience complications related to urinary retention Outcome: Progressing   Problem: Pain Management: Goal: General experience of comfort will improve Outcome: Progressing   Problem: Safety: Goal: Ability to remain free from injury will improve Outcome: Progressing   Problem: Skin Integrity: Goal: Risk for impaired skin integrity will decrease Outcome: Progressing   Problem: Education: Goal: Knowledge of disease or condition will improve Outcome: Progressing Goal: Knowledge of the prescribed therapeutic regimen will improve Outcome: Progressing Goal: Individualized Educational Video(s) Outcome: Progressing   Problem: Activity: Goal: Ability to tolerate increased activity will improve Outcome: Progressing Goal: Will verbalize the importance of balancing  activity with adequate rest periods Outcome: Progressing   Problem: Respiratory: Goal: Ability to maintain a clear airway will improve Outcome: Progressing Goal: Levels of oxygenation will improve Outcome: Progressing Goal: Ability to maintain adequate ventilation will improve Outcome: Progressing   Problem: Education: Goal: Ability to describe self-care measures that may prevent or decrease complications (Diabetes Survival Skills Education) will improve Outcome: Progressing Goal: Individualized Educational Video(s) Outcome: Progressing   Problem: Coping: Goal: Ability to adjust to condition or change in health will improve Outcome: Progressing   Problem: Fluid Volume: Goal: Ability to maintain a balanced intake and output will improve Outcome: Progressing   Problem: Health Behavior/Discharge Planning: Goal: Ability to identify and utilize available resources and services will improve Outcome: Progressing Goal: Ability to manage health-related needs will improve Outcome: Progressing   Problem: Metabolic: Goal: Ability to maintain appropriate glucose levels will improve Outcome: Progressing   Problem: Nutritional: Goal: Maintenance of adequate nutrition will improve Outcome: Progressing Goal: Progress toward achieving an optimal weight will improve Outcome: Progressing   Problem: Skin Integrity: Goal: Risk for impaired skin integrity will decrease Outcome: Progressing   Problem: Tissue Perfusion: Goal: Adequacy of tissue perfusion will improve Outcome: Progressing

## 2023-01-07 NOTE — Progress Notes (Signed)
   01/07/23 0333  BiPAP/CPAP/SIPAP  BiPAP/CPAP/SIPAP Pt Type Adult  BiPAP/CPAP/SIPAP V60  Mask Type Full face mask  Mask Size Medium  Set Rate 14 breaths/min  Respiratory Rate 17 breaths/min  IPAP 14 cmH20  EPAP 6 cmH2O  FiO2 (%) 30 %  Minute Ventilation 10.7  Leak 11  Peak Inspiratory Pressure (PIP) 14  Tidal Volume (Vt) 625  Patient Home Equipment No  Auto Titrate No  Press High Alarm 30 cmH2O  Press Low Alarm 5 cmH2O   Pt. seen on Bipap rounds and currently tolerating well.

## 2023-01-07 NOTE — Progress Notes (Signed)
   01/07/23 0000  BiPAP/CPAP/SIPAP  $ Non-Invasive Ventilator  Non-Invasive Vent Subsequent  BiPAP/CPAP/SIPAP Pt Type Adult  BiPAP/CPAP/SIPAP V60  Mask Type Full face mask  Mask Size Medium  Set Rate 14 breaths/min  Respiratory Rate 21 breaths/min  IPAP 14 cmH20  EPAP 6 cmH2O  FiO2 (%) 30 %  Minute Ventilation 8  Leak 10  Peak Inspiratory Pressure (PIP) 14  Tidal Volume (Vt) 652  Patient Home Equipment No  Auto Titrate No  Press High Alarm (S)  30 cmH2O (Found at this setting-adequite)  Press Low Alarm 5 cmH2O  CPAP/SIPAP surface wiped down Yes   Pt. placed on BiPAP V60 at this time, tolerating well, made aware to notify if need, RN at bedside.

## 2023-01-07 NOTE — Discharge Summary (Addendum)
Physician Discharge Summary  Barbara Bruce:[id number] DOB: Mar 15, 1985 DOA: 01/04/2023  PCP: Patient, No Pcp Per  Admit date: 01/04/2023 Discharge date: 01/07/2023  Admitted From: Home  Discharge disposition: Home   Recommendations for Outpatient Follow-Up:   Follow up with your primary care provider in one week.  Check CBC, BMP, magnesium in the next visit Patient would benefit from sleep apnea test as outpatient, continuation of asthma treatment including bronchodilators as outpatient.   Discharge Diagnosis:   Principal Problem:   Acute respiratory distress Active Problems:   Asthma exacerbation   Essential hypertension   Polysubstance abuse (HCC)   GERD (gastroesophageal reflux disease)   Tobacco abuse disorder   Obesity, Class III, BMI 40-49.9 (morbid obesity) (HCC)   Chest pain   Hypophosphatemia   Discharge Condition: Improved.  Diet recommendation: .  Regular.  Wound care: None.  Code status: Full.   History of Present Illness:   Patient is a 37 year old female history of asthma, hypertension, polysubstance abuse presented to the ED with worsening shortness of breath, cough, chest pain with no improvement on inhaler.  In the ED, patient was noted to be in respiratory distress and received continuous nebulizer, Solu-Medrol, magnesium with clinical improvement.  Patient subsequently admitted for an acute asthma exacerbation due to ongoing respiratory distress was subsequently placed on BiPAP.     Hospital Course:   Following conditions were addressed during hospitalization as listed below,  Acute respiratory distress secondary to an acute asthma exacerbation Chest pain. First exacerbation this year.  Chest x-ray was negative for infiltrate.  Patient received DuoNeb, Pulmicort, Brovana, Flonase, Claritin, PPI,  Mucinex hospitalization.  Due to increased work of breathing wheezing Solu-Medrol dose was increased to 125 twice daily with subsequent  improvement in her symptoms.  Was also placed on BiPAP especially in the nighttime.  At this time patient would benefit from ongoing bronchodilators at home including nebulizer at home for compliance and effectiveness at home for her ongoing asthma with wheezing..  Also add Flovent inhaler on discharge.  Question sleep apnea.  Would benefit from sleep apnea study as outpatient.    Chest pain secondary to coughing. Symptomatic treatment.  Will continue antitussives on discharge.   Essential hypertension Not on medications at home.  Has been started on Norvasc 5 and Cozaar 25 mg during hospitalization.  Patient seems to have improved.  Continue on discharge.   Tobacco abuse Continue nicotine patch.  Counseled about quitting smoking especially with respiratory issues.   Polysubstance abuse -UDS positive for opiates, cocaine, THC. Reemphasized quitting.    Class III obesity -BMI 45.44 kg/m.  Patient would benefit from weight loss as outpatient.  Recommend discussing with PCP as outpatient.    Hypophosphatemia K-Phos tablet supplement during hospitalization.    Disposition.  At this time, patient is stable for disposition home with outpatient PCP follow-up.  Spoke with the patient's family members at bedside.  Medical Consultants:   None.  Procedures:    BiPAP placement Subjective:   Today, patient was seen and examined at bedside.  Feels better.  Has less wheezing.  Ambulating in the hallway.  Wants to go home.  Discharge Exam:   Vitals:   01/07/23 0739 01/07/23 0903  BP:  (!) 170/88  Pulse:    Resp:    Temp:    SpO2: 90%    Vitals:   01/06/23 1939 01/06/23 1947 01/07/23 0739 01/07/23 0903  BP:  (!) 160/95  (!) 170/88  Pulse:  95  Resp:  (!) 21    Temp:  98.3 F (36.8 C)    TempSrc:  Oral    SpO2: 98% 91% 90%   Weight:      Height:       Body mass index is 45.44 kg/m.  General: Alert awake, not in obvious distress, obese built HENT: pupils equally reacting  to light,  No scleral pallor or icterus noted. Oral mucosa is moist.  Chest:  Clear breath sounds.  Diminished breath sounds bilaterally. No crackles or wheezes.  CVS: S1 &S2 heard. No murmur.  Regular rate and rhythm. Abdomen: Soft, nontender, nondistended.  Bowel sounds are heard.   Extremities: No cyanosis, clubbing or edema.  Peripheral pulses are palpable. Psych: Alert, awake and oriented, normal mood CNS:  No cranial nerve deficits.  Power equal in all extremities.   Skin: Warm and dry.  No rashes noted.  The results of significant diagnostics from this hospitalization (including imaging, microbiology, ancillary and laboratory) are listed below for reference.     Diagnostic Studies:   DG Chest 2 View  Result Date: 01/04/2023 CLINICAL DATA:  Dyspnea and chest pain EXAM: CHEST - 2 VIEW COMPARISON:  12/19/2020 chest radiograph. FINDINGS: Stable cardiomediastinal silhouette with top-normal heart size. No pneumothorax. No pleural effusion. Lungs appear clear, with no acute consolidative airspace disease and no pulmonary edema. IMPRESSION: No active cardiopulmonary disease. Electronically Signed   By: Delbert Phenix M.D.   On: 01/04/2023 19:44     Labs:   Basic Metabolic Panel: Recent Labs  Lab 01/04/23 1920 01/05/23 0353 01/05/23 0354 01/06/23 0414  NA 134* 132*  --  135  K 3.5 3.7  --  4.2  CL 99 99  --  103  CO2 27 23  --  25  GLUCOSE 163* 409*  --  287*  BUN 8 9  --  11  CREATININE 0.54 0.67  --  0.57  CALCIUM 8.7* 8.3*  --  9.0  MG  --  2.5*  --  2.3  PHOS  --  2.1* 2.1* 2.2*   GFR Estimated Creatinine Clearance: 93.2 mL/min (by C-G formula based on SCr of 0.57 mg/dL). Liver Function Tests: Recent Labs  Lab 01/05/23 0353 01/05/23 0354  AST 23 23  ALT 19 19  ALKPHOS 63 59  BILITOT 0.6 0.5  PROT 8.2* 8.0  ALBUMIN 3.9 3.8   No results for input(s): "LIPASE", "AMYLASE" in the last 168 hours. No results for input(s): "AMMONIA" in the last 168 hours. Coagulation  profile No results for input(s): "INR", "PROTIME" in the last 168 hours.  CBC: Recent Labs  Lab 01/04/23 1920 01/05/23 0353 01/06/23 0414  WBC 14.5* 15.7* 15.5*  NEUTROABS  --   --  13.2*  HGB 12.0 12.1 11.2*  HCT 39.3 40.4 36.1  MCV 87.7 88.8 87.2  PLT 281 310 287   Cardiac Enzymes: No results for input(s): "CKTOTAL", "CKMB", "CKMBINDEX", "TROPONINI" in the last 168 hours. BNP: Invalid input(s): "POCBNP" CBG: Recent Labs  Lab 01/06/23 0801 01/06/23 1151 01/06/23 1616 01/06/23 2148 01/07/23 0740  GLUCAP 266* 301* 324* 314* 242*   D-Dimer Recent Labs    01/04/23 2056  DDIMER <0.27   Hgb A1c Recent Labs    01/05/23 0353  HGBA1C 7.8*   Lipid Profile No results for input(s): "CHOL", "HDL", "LDLCALC", "TRIG", "CHOLHDL", "LDLDIRECT" in the last 72 hours. Thyroid function studies No results for input(s): "TSH", "T4TOTAL", "T3FREE", "THYROIDAB" in the last 72 hours.  Invalid input(s): "FREET3" Anemia  work up No results for input(s): "VITAMINB12", "FOLATE", "FERRITIN", "TIBC", "IRON", "RETICCTPCT" in the last 72 hours. Microbiology Recent Results (from the past 240 hour(s))  Resp panel by RT-PCR (RSV, Flu A&B, Covid) Anterior Nasal Swab     Status: None   Collection Time: 01/04/23  7:20 PM   Specimen: Anterior Nasal Swab  Result Value Ref Range Status   SARS Coronavirus 2 by RT PCR NEGATIVE NEGATIVE Final    Comment: (NOTE) SARS-CoV-2 target nucleic acids are NOT DETECTED.  The SARS-CoV-2 RNA is generally detectable in upper respiratory specimens during the acute phase of infection. The lowest concentration of SARS-CoV-2 viral copies this assay can detect is 138 copies/mL. A negative result does not preclude SARS-Cov-2 infection and should not be used as the sole basis for treatment or other patient management decisions. A negative result may occur with  improper specimen collection/handling, submission of specimen other than nasopharyngeal swab, presence of  viral mutation(s) within the areas targeted by this assay, and inadequate number of viral copies(<138 copies/mL). A negative result must be combined with clinical observations, patient history, and epidemiological information. The expected result is Negative.  Fact Sheet for Patients:  BloggerCourse.com  Fact Sheet for Healthcare Providers:  SeriousBroker.it  This test is no t yet approved or cleared by the [place] FDA and  has been authorized for detection and/or diagnosis of SARS-CoV-2 by FDA under an Emergency Use Authorization (EUA). This EUA will remain  in effect (meaning this test can be used) for the duration of the COVID-19 declaration under Section 564(b)(1) of the Act, 21 U.S.C.section 360bbb-3(b)(1), unless the authorization is terminated  or revoked sooner.       Influenza A by PCR NEGATIVE NEGATIVE Final   Influenza B by PCR NEGATIVE NEGATIVE Final    Comment: (NOTE) The Xpert Xpress SARS-CoV-2/FLU/RSV plus assay is intended as an aid in the diagnosis of influenza from Nasopharyngeal swab specimens and should not be used as a sole basis for treatment. Nasal washings and aspirates are unacceptable for Xpert Xpress SARS-CoV-2/FLU/RSV testing.  Fact Sheet for Patients: BloggerCourse.com  Fact Sheet for Healthcare Providers: SeriousBroker.it  This test is not yet approved or cleared by the [place] FDA and has been authorized for detection and/or diagnosis of SARS-CoV-2 by FDA under an Emergency Use Authorization (EUA). This EUA will remain in effect (meaning this test can be used) for the duration of the COVID-19 declaration under Section 564(b)(1) of the Act, 21 U.S.C. section 360bbb-3(b)(1), unless the authorization is terminated or revoked.     Resp Syncytial Virus by PCR NEGATIVE NEGATIVE Final    Comment: (NOTE) Fact Sheet for  Patients: BloggerCourse.com  Fact Sheet for Healthcare Providers: SeriousBroker.it  This test is not yet approved or cleared by the [place] FDA and has been authorized for detection and/or diagnosis of SARS-CoV-2 by FDA under an Emergency Use Authorization (EUA). This EUA will remain in effect (meaning this test can be used) for the duration of the COVID-19 declaration under Section 564(b)(1) of the Act, 21 U.S.C. section 360bbb-3(b)(1), unless the authorization is terminated or revoked.  Performed at [facility], [address] [place], [place] [address]   Respiratory (~20 pathogens) panel by PCR     Status: None   Collection Time: 01/04/23 11:05 PM   Specimen: Nasopharyngeal Swab; Respiratory  Result Value Ref Range Status   Adenovirus NOT DETECTED NOT DETECTED Final   Coronavirus 229E NOT DETECTED NOT DETECTED Final    Comment: (NOTE)  The Coronavirus on the Respiratory Panel, DOES NOT test for the novel  Coronavirus (2019 nCoV)    Coronavirus HKU1 NOT DETECTED NOT DETECTED Final   Coronavirus NL63 NOT DETECTED NOT DETECTED Final   Coronavirus OC43 NOT DETECTED NOT DETECTED Final   Metapneumovirus NOT DETECTED NOT DETECTED Final   Rhinovirus / Enterovirus NOT DETECTED NOT DETECTED Final   Influenza A NOT DETECTED NOT DETECTED Final   Influenza B NOT DETECTED NOT DETECTED Final   Parainfluenza Virus 1 NOT DETECTED NOT DETECTED Final   Parainfluenza Virus 2 NOT DETECTED NOT DETECTED Final   Parainfluenza Virus 3 NOT DETECTED NOT DETECTED Final   Parainfluenza Virus 4 NOT DETECTED NOT DETECTED Final   Respiratory Syncytial Virus NOT DETECTED NOT DETECTED Final   Bordetella pertussis NOT DETECTED NOT DETECTED Final   Bordetella Parapertussis NOT DETECTED NOT DETECTED Final   Chlamydophila pneumoniae NOT DETECTED NOT DETECTED Final   Mycoplasma pneumoniae NOT DETECTED NOT DETECTED Final    Comment:  Performed at [facility], [address], [place], [place] [address]  MRSA Next Gen by PCR, Nasal     Status: None   Collection Time: 01/05/23 12:04 AM   Specimen: Urine, Clean Catch; Nasal Swab  Result Value Ref Range Status   MRSA by PCR Next Gen NOT DETECTED NOT DETECTED Final    Comment: (NOTE) The GeneXpert MRSA Assay (FDA approved for NASAL specimens only), is one component of a comprehensive MRSA colonization surveillance program. It is not intended to diagnose MRSA infection nor to guide or monitor treatment for MRSA infections. Test performance is not FDA approved in patients less than 16 years old. Performed at [facility], [address], [place], [place] [address]      Discharge Instructions:   Discharge Instructions     Ambulatory referral to Nutrition and Diabetic Education   Complete by: As directed    New DM dx; A1C 7.8%   Call MD for:  difficulty breathing, headache or visual disturbances   Complete by: As directed    Call MD for:  temperature >100.4   Complete by: As directed    Diet general   Complete by: As directed    Discharge instructions   Complete by: As directed    Follow-up with your primary care provider in 1 week.  Check blood work at that time.  Diabetic diet. Take medications as prescribed and check sugar levels at home. Keep a record of sugar at home  and take it to your doctor to discuss about it. You might need other medications for diabetes. Seek medical attention for worsening symptoms.  Take inhalers as prescribed.  No smoking please   For home use only DME Nebulizer machine   Complete by: As directed    Patient needs a nebulizer to treat with the following condition: Asthma attack   Length of Need: Lifetime   Increase activity slowly   Complete by: As directed       Allergies as of 01/07/2023   No Known Allergies      Medication List     TAKE these medications    acetaminophen 500 MG tablet Commonly  known as: TYLENOL Take 500 mg by mouth every 6 (six) hours as needed for moderate pain (pain score 4-6).   albuterol 108 (90 Base) MCG/ACT inhaler Commonly known as: VENTOLIN HFA Inhale 2 puffs into the lungs every 4 (four) hours as needed for wheezing or shortness of breath. What changed: Another  medication with the same name was added. Make sure you understand how and when to take each.   albuterol (2.5 MG/3ML) 0.083% nebulizer solution Commonly known as: PROVENTIL Take 3 mLs (2.5 mg total) by nebulization every 6 (six) hours as needed for wheezing or shortness of breath. What changed: You were already taking a medication with the same name, and this prescription was added. Make sure you understand how and when to take each.   amLODipine 5 MG tablet Commonly known as: NORVASC Take 1 tablet (5 mg total) by mouth daily.   Blood Glucose Monitoring Suppl Devi 1 each by Does not apply route in the morning, at noon, and at bedtime. May substitute to any manufacturer covered by patient's insurance.   BLOOD GLUCOSE TEST STRIPS Strp 1 each by In Vitro route in the morning, at noon, and at bedtime. May substitute to any manufacturer covered by patient's insurance.   fluticasone 110 MCG/ACT inhaler Commonly known as: FLOVENT HFA Inhale 2 puffs into the lungs 2 (two) times daily.   guaiFENesin 600 MG 12 hr tablet Commonly known as: MUCINEX Take 2 tablets (1,200 mg total) by mouth 2 (two) times daily for 5 days.   HYDROcodone-acetaminophen 5-325 MG tablet Commonly known as: Norco Take 1 tablet by mouth every 6 (six) hours as needed for severe pain (pain score 7-10).   ketorolac 10 MG tablet Commonly known as: TORADOL Take 1 tablet (10 mg total) by mouth every 6 (six) hours as needed for moderate pain (pain score 4-6).   Lancet Device Misc 1 each by Does not apply route in the morning, at noon, and at bedtime. May substitute to any manufacturer covered by patient's insurance.   Lancets  Misc. Misc 1 each by Does not apply route in the morning, at noon, and at bedtime. May substitute to any manufacturer covered by patient's insurance.   loratadine 10 MG tablet Commonly known as: CLARITIN Take 1 tablet (10 mg total) by mouth daily.   losartan 25 MG tablet Commonly known as: COZAAR Take 1 tablet (25 mg total) by mouth daily.   metFORMIN 850 MG tablet Commonly known as: GLUCOPHAGE Take 1 tablet (850 mg total) by mouth 2 (two) times daily with a meal.   methocarbamol 750 MG tablet Commonly known as: Robaxin-750 Take 1 tablet (750 mg total) by mouth every 6 (six) hours as needed for up to 5 days for muscle spasms.   naproxen 375 MG tablet Commonly known as: NAPROSYN Take 1 tablet twice daily for shoulder pain.   nicotine 14 mg/24hr patch Commonly known as: NICODERM CQ - dosed in mg/24 hours Place 1 patch (14 mg total) onto the skin daily.   predniSONE 10 MG tablet Commonly known as: DELTASONE Take 3 tablets (30 mg total) by mouth daily with breakfast for 3 days.               Durable Medical Equipment  (From admission, onward)           Start     Ordered   01/07/23 0000  For home use only DME Nebulizer machine       Question Answer Comment  Patient needs a nebulizer to treat with the following condition Asthma attack   Length of Need Lifetime      01/07/23 0919            Follow-up Information     [facility] Follow up.   Specialty: Family Medicine Why: Appointment: Wednesday, January 20, 2023 at 2:10pm  Please bring ID and Insurance card to appointment   Please call at least 24 hours in advance to cancel or reschedule appointment Contact information: [address] [place] [facility] [address] [phone]        [facility] Follow up.   Why: [facility] is to deliver Nebulizer MAchine to your home at discharge. Contact information: [address] [place]  [address] [phone]                  Time coordinating discharge: 39 minutes  Signed:  Desma Wilkowski  [facility] 01/07/2023, 10:23 AM

## 2023-01-19 ENCOUNTER — Telehealth (INDEPENDENT_AMBULATORY_CARE_PROVIDER_SITE_OTHER): Payer: Self-pay | Admitting: Primary Care

## 2023-01-19 DIAGNOSIS — J4551 Severe persistent asthma with (acute) exacerbation: Secondary | ICD-10-CM | POA: Diagnosis not present

## 2023-01-19 NOTE — Telephone Encounter (Signed)
 Called pt to confirm apt. Pts phone is unavailable.

## 2023-01-20 ENCOUNTER — Ambulatory Visit (INDEPENDENT_AMBULATORY_CARE_PROVIDER_SITE_OTHER): Payer: Medicaid Other | Admitting: Primary Care

## 2023-01-20 ENCOUNTER — Encounter (INDEPENDENT_AMBULATORY_CARE_PROVIDER_SITE_OTHER): Payer: Self-pay | Admitting: Primary Care

## 2023-01-20 VITALS — BP 123/89 | HR 89 | Resp 16 | Ht <= 58 in | Wt 217.2 lb

## 2023-01-20 DIAGNOSIS — Z09 Encounter for follow-up examination after completed treatment for conditions other than malignant neoplasm: Secondary | ICD-10-CM | POA: Diagnosis not present

## 2023-01-20 DIAGNOSIS — R0603 Acute respiratory distress: Secondary | ICD-10-CM | POA: Diagnosis not present

## 2023-01-20 DIAGNOSIS — E66813 Obesity, class 3: Secondary | ICD-10-CM | POA: Diagnosis not present

## 2023-01-20 DIAGNOSIS — Z6841 Body Mass Index (BMI) 40.0 and over, adult: Secondary | ICD-10-CM

## 2023-01-20 DIAGNOSIS — Z7689 Persons encountering health services in other specified circumstances: Secondary | ICD-10-CM | POA: Diagnosis not present

## 2023-01-20 DIAGNOSIS — J4551 Severe persistent asthma with (acute) exacerbation: Secondary | ICD-10-CM | POA: Diagnosis not present

## 2023-01-20 DIAGNOSIS — R0602 Shortness of breath: Secondary | ICD-10-CM | POA: Diagnosis not present

## 2023-01-20 DIAGNOSIS — J45901 Unspecified asthma with (acute) exacerbation: Secondary | ICD-10-CM

## 2023-01-20 NOTE — Progress Notes (Signed)
Subjective:   Barbara Bruce is a 37 y.o. female presents for hospital follow up and establish care. Admit date to the hospital was 01/04/23, patient was discharged from the hospital on 01/07/23, patient was admitted for:   Acute respiratory distress, Asthma exacerbation, Essential hypertension, Stopped 4 years ago Polysubstance abuse (   GERD (gastroesophageal reflux disease), Tobacco abuse disorder, Obesity, Class III, BMI 40-49.9 (morbid obesity) (HCC), Chest pain and Hypophosphatemia. Today she present short of breath 85 O2 room air with exertion 87 sitting/resting, auditory wheezing and in all 4 quadrants. No on 2 liters 100% feeling a little better but has a productive cough. She is still on prednisone and a nebulizer was delivered yesterday. She was supposed to receive it at discharge.   Past Medical History:  Diagnosis Date   Asthma    GERD (gastroesophageal reflux disease)    Glands swollen    Per client, in neck   History of anemia    History of blood transfusion    History of breast lump    History of shortness of breath    History of TMJ disorder    History of weight gain    Hypertension    Plantar fasciitis, bilateral    Polysubstance abuse (HCC)    repeatedly positive for cocaine, THC   Ulcer      No Known Allergies  Current Outpatient Medications on File Prior to Visit  Medication Sig Dispense Refill   acetaminophen (TYLENOL) 500 MG tablet Take 500 mg by mouth every 6 (six) hours as needed for moderate pain (pain score 4-6).     albuterol (PROVENTIL) (2.5 MG/3ML) 0.083% nebulizer solution Take 3 mLs (2.5 mg total) by nebulization every 6 (six) hours as needed for wheezing or shortness of breath. 75 mL 12   albuterol (VENTOLIN HFA) 108 (90 Base) MCG/ACT inhaler Inhale 2 puffs into the lungs every 4 (four) hours as needed for wheezing or shortness of breath. 8 g 2   amLODipine (NORVASC) 5 MG tablet Take 1 tablet (5 mg total) by mouth daily. 30 tablet 2   Blood Glucose  Monitoring Suppl DEVI 1 each by Does not apply route in the morning, at noon, and at bedtime. May substitute to any manufacturer covered by patient's insurance. 1 each 0   fluticasone (FLOVENT HFA) 110 MCG/ACT inhaler Inhale 2 puffs into the lungs 2 (two) times daily. 1 each 2   Glucose Blood (BLOOD GLUCOSE TEST STRIPS) STRP 1 each by In Vitro route in the morning, at noon, and at bedtime. May substitute to any manufacturer covered by patient's insurance. 100 strip 0   HYDROcodone-acetaminophen (NORCO) 5-325 MG tablet Take 1 tablet by mouth every 6 (six) hours as needed for severe pain (pain score 7-10). 12 tablet 0   ketorolac (TORADOL) 10 MG tablet Take 1 tablet (10 mg total) by mouth every 6 (six) hours as needed for moderate pain (pain score 4-6). 12 tablet 0   Lancet Device MISC 1 each by Does not apply route in the morning, at noon, and at bedtime. May substitute to any manufacturer covered by patient's insurance. 1 each 0   Lancets Misc. MISC 1 each by Does not apply route in the morning, at noon, and at bedtime. May substitute to any manufacturer covered by patient's insurance. 100 each 0   loratadine (CLARITIN) 10 MG tablet Take 1 tablet (10 mg total) by mouth daily. 30 tablet 0   losartan (COZAAR) 25 MG tablet Take 1 tablet (25  mg total) by mouth daily. 30 tablet 2   metFORMIN (GLUCOPHAGE) 850 MG tablet Take 1 tablet (850 mg total) by mouth 2 (two) times daily with a meal. 60 tablet 0   naproxen (NAPROSYN) 375 MG tablet Take 1 tablet twice daily for shoulder pain. 20 tablet 0   nicotine (NICODERM CQ - DOSED IN MG/24 HOURS) 14 mg/24hr patch Place 1 patch (14 mg total) onto the skin daily. 28 patch 0   No current facility-administered medications on file prior to visit.    Review of System:  Comprehensive ROS Pertinent positive and negative noted in HPI   Objective:  BP 123/89   Pulse 89   Resp 16   Ht 4\' 9"  (1.448 m)   Wt 217 lb 3.2 oz (98.5 kg)   LMP 12/21/2022 (Approximate)    SpO2 (!) 88% Comment: witho2  BMI 47.00 kg/m  BP 123/89   Pulse 89   Resp 16   Ht 4\' 9"  (1.448 m)   Wt 217 lb 3.2 oz (98.5 kg)   LMP 12/21/2022 (Approximate)   SpO2 (!) 88% Comment: witho2  BMI 47.00 kg/m   Physical Exam Vitals reviewed.  Constitutional:      Appearance: She is obese.     Comments: morbid  HENT:     Head: Normocephalic.     Right Ear: Tympanic membrane, ear canal and external ear normal.     Left Ear: Tympanic membrane, ear canal and external ear normal.     Nose: Congestion present.     Mouth/Throat:     Mouth: Mucous membranes are moist.  Eyes:     Extraocular Movements: Extraocular movements intact.     Pupils: Pupils are equal, round, and reactive to light.  Cardiovascular:     Rate and Rhythm: Normal rate.  Pulmonary:     Effort: Respiratory distress present.     Breath sounds: Wheezing present.  Abdominal:     General: Bowel sounds are normal. There is distension.     Palpations: Abdomen is soft.  Musculoskeletal:        General: Normal range of motion.     Cervical back: Normal range of motion and neck supple.  Skin:    General: Skin is warm and dry.  Neurological:     Mental Status: She is alert and oriented to person, place, and time.  Psychiatric:        Mood and Affect: Mood normal.      Assessment:  Svea was seen today for hospitalization follow-up.  Diagnoses and all orders for this visit:  Encounter to establish care  Shortness of breath -     Cancel: For home use only DME oxygen -     For home use only DME oxygen  Acute respiratory distress -     Cancel: For home use only DME oxygen -     For home use only DME oxygen  Exacerbation of persistent asthma, unspecified asthma severity -     Cancel: For home use only DME oxygen -     For home use only DME oxygen   Hospital discharge follow-up  See HPI  This note has been created with Dragon speech recognition software and Paediatric nurse. Any transcriptional  errors are unintentional.   Return in about 3 months (around 04/20/2023).  Grayce Sessions, NP 01/26/2023, 2:12 PM

## 2023-01-26 ENCOUNTER — Encounter (INDEPENDENT_AMBULATORY_CARE_PROVIDER_SITE_OTHER): Payer: Self-pay | Admitting: Primary Care

## 2023-02-05 ENCOUNTER — Emergency Department (HOSPITAL_COMMUNITY)
Admission: EM | Admit: 2023-02-05 | Discharge: 2023-02-05 | Disposition: A | Discharge status: Home / Self Care | Payer: Medicaid Other | Attending: Emergency Medicine | Admitting: Emergency Medicine

## 2023-02-05 ENCOUNTER — Other Ambulatory Visit: Payer: Self-pay

## 2023-02-05 ENCOUNTER — Encounter (HOSPITAL_COMMUNITY): Payer: Self-pay

## 2023-02-05 DIAGNOSIS — H6691 Otitis media, unspecified, right ear: Secondary | ICD-10-CM | POA: Diagnosis not present

## 2023-02-05 DIAGNOSIS — H9203 Otalgia, bilateral: Secondary | ICD-10-CM | POA: Diagnosis present

## 2023-02-05 DIAGNOSIS — H6690 Otitis media, unspecified, unspecified ear: Secondary | ICD-10-CM

## 2023-02-05 MED ORDER — LIDOCAINE HCL 2 % IJ SOLN
5.0000 mL | Freq: Once | INTRAMUSCULAR | Status: AC
  Administered 2023-02-05: 100 mg
  Filled 2023-02-05: qty 20

## 2023-02-05 MED ORDER — AMOXICILLIN-POT CLAVULANATE 875-125 MG PO TABS
1.0000 | ORAL_TABLET | Freq: Once | ORAL | Status: AC
  Administered 2023-02-05: 1 via ORAL
  Filled 2023-02-05: qty 1

## 2023-02-05 MED ORDER — AMOXICILLIN-POT CLAVULANATE 875-125 MG PO TABS
1.0000 | ORAL_TABLET | Freq: Two times a day (BID) | ORAL | 0 refills | Status: DC
Start: 2023-02-05 — End: 2023-12-28

## 2023-02-05 NOTE — ED Triage Notes (Signed)
Pt arrives via POV. Pt reports bilateral ear pain since yesterday. Pt AxOx4.

## 2023-02-05 NOTE — ED Provider Notes (Signed)
[place] EMERGENCY DEPARTMENT AT [facility] Provider Note   CSN: [id number] Arrival date & time: 02/05/23  1345     History  Chief Complaint  Patient presents with   Otalgia    Barbara Bruce is a 37 y.o. female here presenting with bilateral ear pain.  Patient has bilateral ear pain since yesterday.  Patient has history of ear infections.  Patient denies swimming or history of otitis externa   The history is provided by the patient.       Home Medications Prior to Admission medications   Medication Sig Start Date End Date Taking? Authorizing Provider  acetaminophen (TYLENOL) 500 MG tablet Take 500 mg by mouth every 6 (six) hours as needed for moderate pain (pain score 4-6).    [provider]  albuterol (PROVENTIL) (2.5 MG/3ML) 0.083% nebulizer solution Take 3 mLs (2.5 mg total) by nebulization every 6 (six) hours as needed for wheezing or shortness of breath. 01/07/23   Pokhrel, Rebekah Chesterfield, MD  albuterol (VENTOLIN HFA) 108 (90 Base) MCG/ACT inhaler Inhale 2 puffs into the lungs every 4 (four) hours as needed for wheezing or shortness of breath. 01/07/23   Pokhrel, Rebekah Chesterfield, MD  amLODipine (NORVASC) 5 MG tablet Take 1 tablet (5 mg total) by mouth daily. 01/07/23 04/07/23  Pokhrel, Rebekah Chesterfield, MD  Blood Glucose Monitoring Suppl DEVI 1 each by Does not apply route in the morning, at noon, and at bedtime. May substitute to any manufacturer covered by patient's insurance. 01/07/23   Pokhrel, Rebekah Chesterfield, MD  fluticasone (FLOVENT HFA) 110 MCG/ACT inhaler Inhale 2 puffs into the lungs 2 (two) times daily. 01/07/23 01/07/24  Pokhrel, Rebekah Chesterfield, MD  Glucose Blood (BLOOD GLUCOSE TEST STRIPS) STRP 1 each by In Vitro route in the morning, at noon, and at bedtime. May substitute to any manufacturer covered by patient's insurance. 01/07/23 02/06/23  Pokhrel, Rebekah Chesterfield, MD  HYDROcodone-acetaminophen (NORCO) 5-325 MG tablet Take 1 tablet by mouth every 6 (six) hours as needed for severe pain  (pain score 7-10). 01/07/23   Pokhrel, Rebekah Chesterfield, MD  ketorolac (TORADOL) 10 MG tablet Take 1 tablet (10 mg total) by mouth every 6 (six) hours as needed for moderate pain (pain score 4-6). 01/07/23   Pokhrel, Rebekah Chesterfield, MD  Lancet Device MISC 1 each by Does not apply route in the morning, at noon, and at bedtime. May substitute to any manufacturer covered by patient's insurance. 01/07/23 02/06/23  Joycelyn Das, MD  Lancets Misc. MISC 1 each by Does not apply route in the morning, at noon, and at bedtime. May substitute to any manufacturer covered by patient's insurance. 01/07/23 02/06/23  Pokhrel, Rebekah Chesterfield, MD  loratadine (CLARITIN) 10 MG tablet Take 1 tablet (10 mg total) by mouth daily. 01/07/23   Pokhrel, Rebekah Chesterfield, MD  losartan (COZAAR) 25 MG tablet Take 1 tablet (25 mg total) by mouth daily. 01/07/23 04/07/23  Pokhrel, Rebekah Chesterfield, MD  metFORMIN (GLUCOPHAGE) 850 MG tablet Take 1 tablet (850 mg total) by mouth 2 (two) times daily with a meal. 01/07/23 02/06/23  Pokhrel, Rebekah Chesterfield, MD  naproxen (NAPROSYN) 375 MG tablet Take 1 tablet twice daily for shoulder pain. 01/07/23   Pokhrel, Rebekah Chesterfield, MD  nicotine (NICODERM CQ - DOSED IN MG/24 HOURS) 14 mg/24hr patch Place 1 patch (14 mg total) onto the skin daily. 01/07/23   Pokhrel, Rebekah Chesterfield, MD      Allergies    Patient has no known allergies.    Review of Systems   Review of Systems  HENT:  Positive for ear  pain.   All other systems reviewed and are negative.   Physical Exam Updated Vital Signs BP (!) 173/118   Pulse 98   Temp 98.8 F (37.1 C)   Resp 17   Ht 4\' 9"  (1.448 m)   Wt 97.1 kg   LMP 12/21/2022 (Approximate)   SpO2 98%   BMI 46.31 kg/m  Physical Exam Vitals and nursing note reviewed.  Constitutional:      Appearance: Normal appearance.  HENT:     Head: Normocephalic.     Ears:     Comments: No obvious otitis externa.  Patient has right otitis media.    Nose: Nose normal.     Mouth/Throat:     Mouth: Mucous membranes are moist.  Eyes:      Extraocular Movements: Extraocular movements intact.     Pupils: Pupils are equal, round, and reactive to light.  Cardiovascular:     Rate and Rhythm: Normal rate and regular rhythm.     Pulses: Normal pulses.     Heart sounds: Normal heart sounds.  Pulmonary:     Effort: Pulmonary effort is normal.     Breath sounds: Normal breath sounds.  Abdominal:     General: Abdomen is flat.     Palpations: Abdomen is soft.  Musculoskeletal:        General: Normal range of motion.     Cervical back: Normal range of motion and neck supple.  Skin:    General: Skin is warm.     Capillary Refill: Capillary refill takes less than 2 seconds.  Neurological:     General: No focal deficit present.     Mental Status: She is alert and oriented to person, place, and time.  Psychiatric:        Mood and Affect: Mood normal.        Behavior: Behavior normal.     ED Results / Procedures / Treatments   Labs (all labs ordered are listed, but only abnormal results are displayed) Labs Reviewed - No data to display  EKG None  Radiology No results found.  Procedures Procedures    Medications Ordered in ED Medications  lidocaine (XYLOCAINE) 2 % (with pres) injection 100 mg (has no administration in time range)  amoxicillin-clavulanate (AUGMENTIN) 875-125 MG per tablet 1 tablet (has no administration in time range)    ED Course/ Medical Decision Making/ A&P                                 Medical Decision Making Barbara Bruce is a 37 y.o. female here presenting with right otitis media.  Patient has a history of otitis media.  Patient has no otitis externa on exam.  Will discharge home with course of Augmentin.  Will give lidocaine to the ear canal for pain   Problems Addressed: Acute otitis media, unspecified otitis media type: acute illness or injury  Risk Prescription drug management.    Final Clinical Impression(s) / ED Diagnoses Final diagnoses:  None    Rx / DC Orders ED  Discharge Orders     None         Charlynne Pander, MD 02/05/23 1429

## 2023-02-05 NOTE — Discharge Instructions (Addendum)
Take Tylenol or Motrin for pain  Take Augmentin twice daily for a week for ear infection  See your doctor for follow-up  Return to ER if you have worse ear pain

## 2023-02-20 DIAGNOSIS — J4551 Severe persistent asthma with (acute) exacerbation: Secondary | ICD-10-CM | POA: Diagnosis not present

## 2023-02-20 DIAGNOSIS — R0602 Shortness of breath: Secondary | ICD-10-CM | POA: Diagnosis not present

## 2023-02-20 DIAGNOSIS — R0603 Acute respiratory distress: Secondary | ICD-10-CM | POA: Diagnosis not present

## 2023-02-25 ENCOUNTER — Ambulatory Visit (INDEPENDENT_AMBULATORY_CARE_PROVIDER_SITE_OTHER): Payer: Medicaid Other | Admitting: Primary Care

## 2023-03-16 ENCOUNTER — Ambulatory Visit (INDEPENDENT_AMBULATORY_CARE_PROVIDER_SITE_OTHER): Payer: Medicaid Other | Admitting: Primary Care

## 2023-03-23 ENCOUNTER — Ambulatory Visit (INDEPENDENT_AMBULATORY_CARE_PROVIDER_SITE_OTHER): Payer: Medicaid Other | Admitting: Primary Care

## 2023-03-23 DIAGNOSIS — R0603 Acute respiratory distress: Secondary | ICD-10-CM | POA: Diagnosis not present

## 2023-03-23 DIAGNOSIS — R0602 Shortness of breath: Secondary | ICD-10-CM | POA: Diagnosis not present

## 2023-03-23 DIAGNOSIS — J4551 Severe persistent asthma with (acute) exacerbation: Secondary | ICD-10-CM | POA: Diagnosis not present

## 2023-03-31 ENCOUNTER — Telehealth (INDEPENDENT_AMBULATORY_CARE_PROVIDER_SITE_OTHER): Payer: Medicaid Other | Admitting: Primary Care

## 2023-04-07 ENCOUNTER — Encounter (INDEPENDENT_AMBULATORY_CARE_PROVIDER_SITE_OTHER): Payer: Self-pay | Admitting: Primary Care

## 2023-04-07 ENCOUNTER — Telehealth (INDEPENDENT_AMBULATORY_CARE_PROVIDER_SITE_OTHER): Payer: Medicaid Other | Admitting: Primary Care

## 2023-04-07 DIAGNOSIS — H6690 Otitis media, unspecified, unspecified ear: Secondary | ICD-10-CM

## 2023-04-07 MED ORDER — IBUPROFEN 800 MG PO TABS: 800.0000 mg | ORAL_TABLET | Freq: Three times a day (TID) | ORAL | 0 refills | Status: AC | PRN

## 2023-04-07 NOTE — Progress Notes (Signed)
 [facility]  Virtual Visit Note  I connected with Barbara Bruce, on 04/07/2023 at 4:54 PM through an audio and video application and verified that I am speaking with the correct person using two identifiers.   Consent: I discussed the limitations, risks, security and privacy concerns of performing an evaluation and management service by mychart and the availability of in person appointments. I also discussed with the patient that there may be a patient responsible charge related to this service. The patient expressed understanding and agreed to proceed.   Location of Patient: home  Location of Provider: [facility]   Persons participating in visit: Barbara Bruce Gwinda Passe,  NP   History of Present Illness: Barbara Bruce is a 38 year old female with c/o bilateral ear pain /burning canal, drainage, dizzy , off balance and make her feel like she will have a syncope episode. Alleviating factors keeping still, laying down or sitting. Little to no relief with OTC ibuprofen or tylenol.    Past Medical History:  Diagnosis Date   Asthma    GERD (gastroesophageal reflux disease)    Glands swollen    Per client, in neck   History of anemia    History of blood transfusion    History of breast lump    History of shortness of breath    History of TMJ disorder    History of weight gain    Hypertension    Plantar fasciitis, bilateral    Polysubstance abuse (HCC)    repeatedly positive for cocaine, THC   Ulcer    No Known Allergies  Current Outpatient Medications on File Prior to Visit  Medication Sig Dispense Refill   acetaminophen (TYLENOL) 500 MG tablet Take 500 mg by mouth every 6 (six) hours as needed for moderate pain (pain score 4-6).     albuterol (PROVENTIL) (2.5 MG/3ML) 0.083% nebulizer solution Take 3 mLs (2.5 mg total) by nebulization every 6 (six) hours as needed for wheezing or shortness of  breath. 75 mL 12   albuterol (VENTOLIN HFA) 108 (90 Base) MCG/ACT inhaler Inhale 2 puffs into the lungs every 4 (four) hours as needed for wheezing or shortness of breath. 8 g 2   amLODipine (NORVASC) 5 MG tablet Take 1 tablet (5 mg total) by mouth daily. 30 tablet 2   amoxicillin-clavulanate (AUGMENTIN) 875-125 MG tablet Take 1 tablet by mouth every 12 (twelve) hours. 14 tablet 0   Blood Glucose Monitoring Suppl DEVI 1 each by Does not apply route in the morning, at noon, and at bedtime. May substitute to any manufacturer covered by patient's insurance. 1 each 0   fluticasone (FLOVENT HFA) 110 MCG/ACT inhaler Inhale 2 puffs into the lungs 2 (two) times daily. 1 each 2   HYDROcodone-acetaminophen (NORCO) 5-325 MG tablet Take 1 tablet by mouth every 6 (six) hours as needed for severe pain (pain score 7-10). 12 tablet 0   loratadine (CLARITIN) 10 MG tablet Take 1 tablet (10 mg total) by mouth daily. 30 tablet 0   losartan (COZAAR) 25 MG tablet Take 1 tablet (25 mg total) by mouth daily. 30 tablet 2   metFORMIN (GLUCOPHAGE) 850 MG tablet Take 1 tablet (850 mg total) by mouth 2 (two) times daily with a meal. 60 tablet 0   nicotine (NICODERM CQ - DOSED IN MG/24 HOURS) 14 mg/24hr patch Place 1 patch (14 mg total) onto the skin daily. 28 patch 0  No current facility-administered medications on file prior to visit.    Observations/Objective: See HPI   Assessment and Plan: Diagnoses and all orders for this visit:  Chronic otitis media, unspecified otitis media type -     Ambulatory referral to ENT -     ibuprofen (ADVIL) 800 MG tablet; Take 1 tablet (800 mg total) by mouth every 8 (eight) hours as needed for moderate pain (pain score 4-6).     Follow Up Instructions:    I discussed the assessment and treatment plan with the patient. The patient was provided an opportunity to ask questions and all were answered. The patient agreed with the plan and demonstrated an understanding of the  instructions.   The patient was advised to call back or seek an in-person evaluation if the symptoms worsen or if the condition fails to improve as anticipated.     I provided 15 minutes total time during this encounter including median intraservice time, reviewing previous notes, investigations, ordering medications, medical decision making, coordinating care and patient verbalized understanding at the end of the visit.    This note has been created with Education officer, environmental. Any transcriptional errors are unintentional.   Grayce Sessions, NP 04/07/2023, 4:54 PM

## 2023-04-09 ENCOUNTER — Other Ambulatory Visit (INDEPENDENT_AMBULATORY_CARE_PROVIDER_SITE_OTHER): Payer: Self-pay | Admitting: Primary Care

## 2023-04-09 DIAGNOSIS — H6690 Otitis media, unspecified, unspecified ear: Secondary | ICD-10-CM

## 2023-04-20 DIAGNOSIS — J4551 Severe persistent asthma with (acute) exacerbation: Secondary | ICD-10-CM | POA: Diagnosis not present

## 2023-04-20 DIAGNOSIS — R0602 Shortness of breath: Secondary | ICD-10-CM | POA: Diagnosis not present

## 2023-04-20 DIAGNOSIS — R0603 Acute respiratory distress: Secondary | ICD-10-CM | POA: Diagnosis not present

## 2023-05-17 DIAGNOSIS — H9203 Otalgia, bilateral: Secondary | ICD-10-CM | POA: Diagnosis not present

## 2023-05-17 DIAGNOSIS — J3489 Other specified disorders of nose and nasal sinuses: Secondary | ICD-10-CM | POA: Diagnosis not present

## 2023-05-17 DIAGNOSIS — M26623 Arthralgia of bilateral temporomandibular joint: Secondary | ICD-10-CM | POA: Diagnosis not present

## 2023-05-21 DIAGNOSIS — R0603 Acute respiratory distress: Secondary | ICD-10-CM | POA: Diagnosis not present

## 2023-05-21 DIAGNOSIS — R0602 Shortness of breath: Secondary | ICD-10-CM | POA: Diagnosis not present

## 2023-05-21 DIAGNOSIS — J4551 Severe persistent asthma with (acute) exacerbation: Secondary | ICD-10-CM | POA: Diagnosis not present

## 2023-06-04 ENCOUNTER — Telehealth

## 2023-06-16 ENCOUNTER — Ambulatory Visit (INDEPENDENT_AMBULATORY_CARE_PROVIDER_SITE_OTHER): Admitting: Primary Care

## 2023-06-20 DIAGNOSIS — R0602 Shortness of breath: Secondary | ICD-10-CM | POA: Diagnosis not present

## 2023-06-20 DIAGNOSIS — J4551 Severe persistent asthma with (acute) exacerbation: Secondary | ICD-10-CM | POA: Diagnosis not present

## 2023-06-20 DIAGNOSIS — R0603 Acute respiratory distress: Secondary | ICD-10-CM | POA: Diagnosis not present

## 2023-07-07 IMAGING — CR DG CHEST 2V
2 series · 2 of 2 positions shown · non-contrast
Comparison: 10/11/2020.

CLINICAL DATA: Shortness of breath, wheezing, mild chest pain.

EXAM:
CHEST - 2 VIEW

[w chest pa]
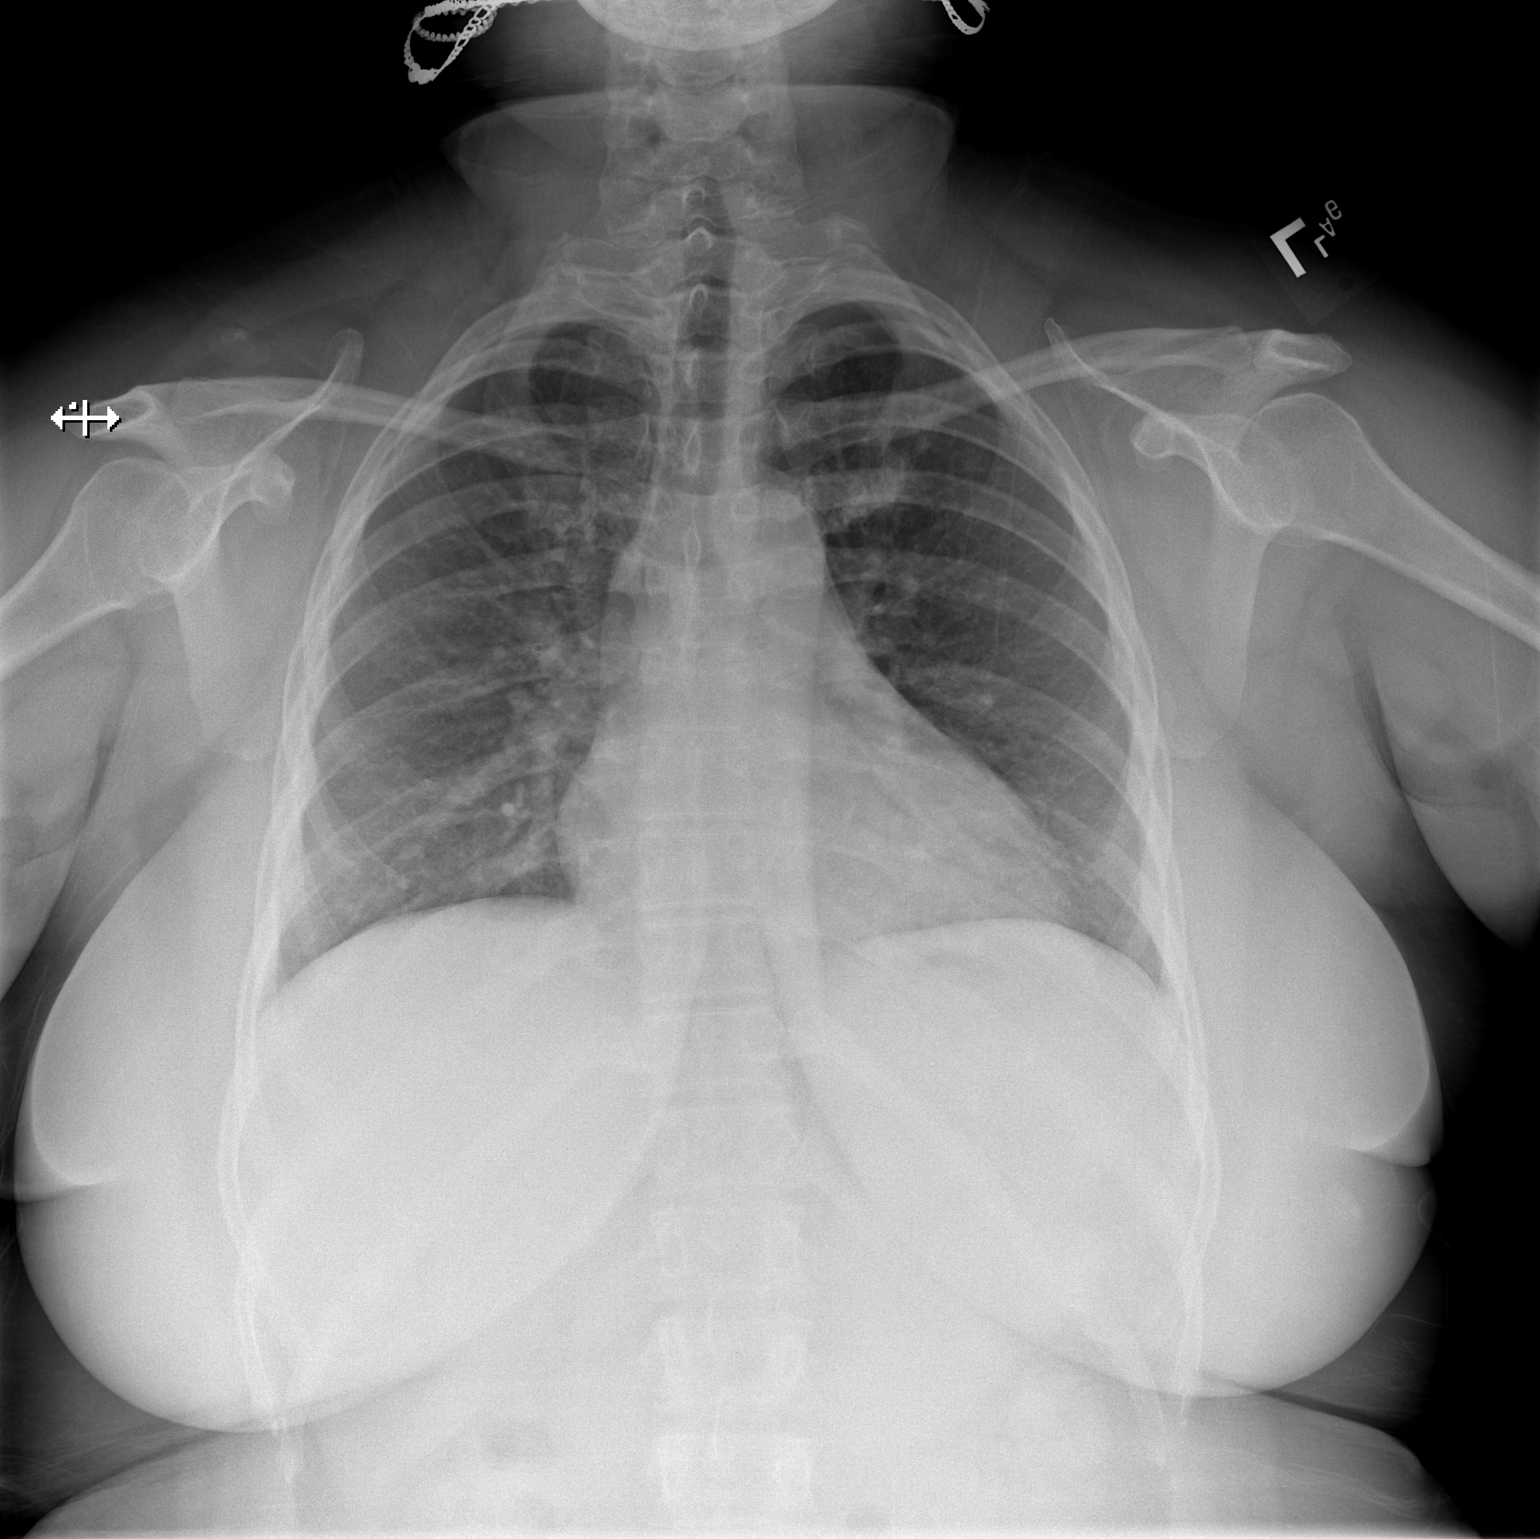

[w chest lat]
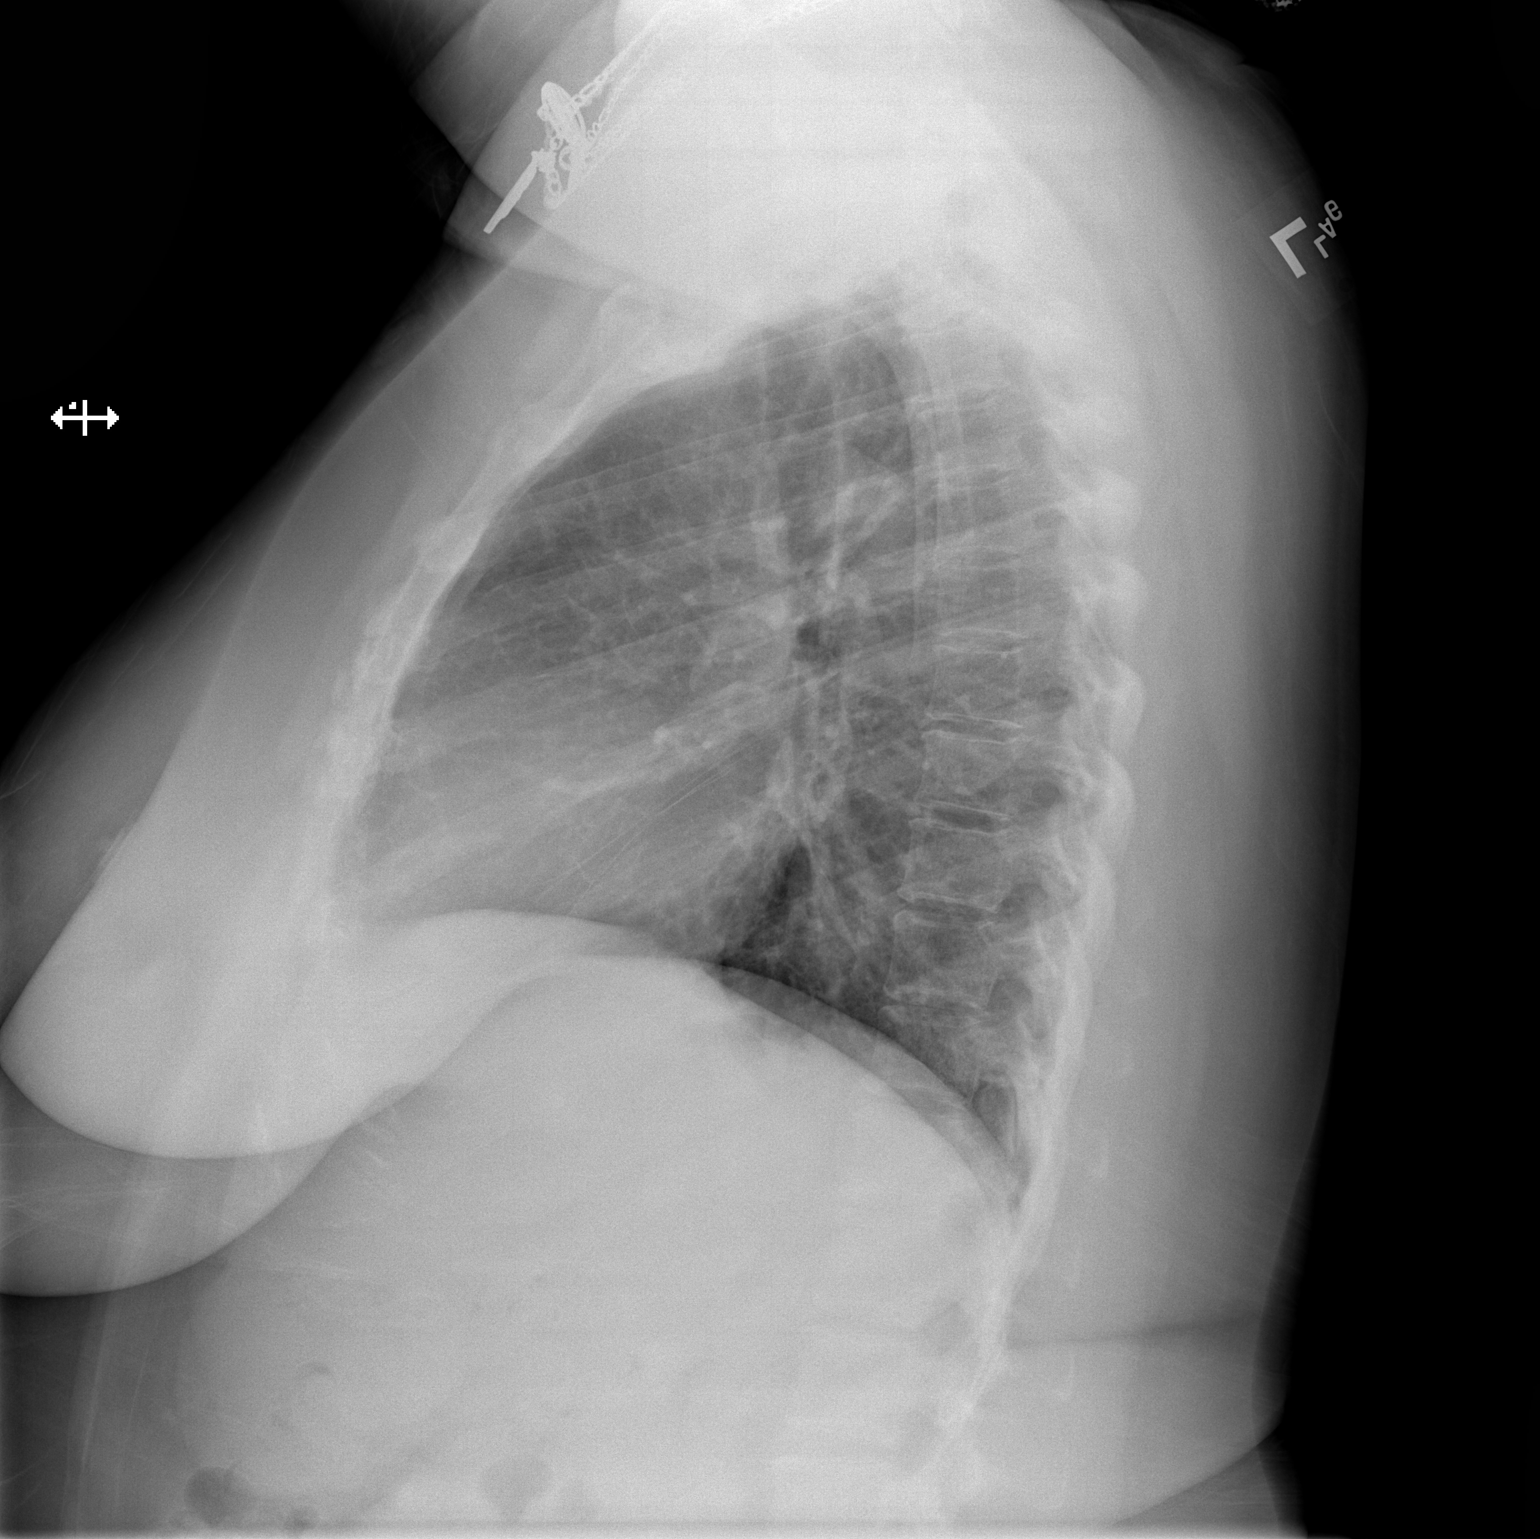

[2 of 2 positions shown; findings below may reference images not displayed]

FINDINGS: The heart is normal in size and the mediastinal structures are
within normal limits. No consolidation, effusion, or pneumothorax.
No acute osseous abnormality.
IMPRESSION: No acute cardiopulmonary process.

## 2023-07-21 DIAGNOSIS — J4551 Severe persistent asthma with (acute) exacerbation: Secondary | ICD-10-CM | POA: Diagnosis not present

## 2023-08-15 ENCOUNTER — Telehealth: Admitting: Physician Assistant

## 2023-08-15 DIAGNOSIS — H9203 Otalgia, bilateral: Secondary | ICD-10-CM

## 2023-08-15 NOTE — Progress Notes (Signed)
 Because you are experiencing a chronic problem with your ear pain for which you are seeing specialists, I am not able to help you with this problem today by EVisit. I recommend that you be seen for a face to face visit.  Please contact your primary care practice or your ENT to be seen.  NOTE: You will NOT be charged for this eVisit.  If you are having a true medical emergency please call 911.   Your e-visit answers were reviewed by a board certified advanced clinical practitioner to complete your personal care plan.  Thank you for using e-Visits.

## 2023-08-20 DIAGNOSIS — J4551 Severe persistent asthma with (acute) exacerbation: Secondary | ICD-10-CM | POA: Diagnosis not present

## 2023-09-02 ENCOUNTER — Ambulatory Visit (INDEPENDENT_AMBULATORY_CARE_PROVIDER_SITE_OTHER): Admitting: Primary Care

## 2023-09-02 ENCOUNTER — Encounter (INDEPENDENT_AMBULATORY_CARE_PROVIDER_SITE_OTHER): Payer: Self-pay | Admitting: Primary Care

## 2023-09-02 VITALS — BP 144/85 | HR 95 | Resp 16 | Wt 194.4 lb

## 2023-09-02 DIAGNOSIS — Z6841 Body Mass Index (BMI) 40.0 and over, adult: Secondary | ICD-10-CM

## 2023-09-02 DIAGNOSIS — E662 Morbid (severe) obesity with alveolar hypoventilation: Secondary | ICD-10-CM | POA: Diagnosis not present

## 2023-09-02 DIAGNOSIS — E119 Type 2 diabetes mellitus without complications: Secondary | ICD-10-CM | POA: Diagnosis not present

## 2023-09-02 DIAGNOSIS — I10 Essential (primary) hypertension: Secondary | ICD-10-CM

## 2023-09-02 DIAGNOSIS — N3946 Mixed incontinence: Secondary | ICD-10-CM | POA: Diagnosis not present

## 2023-09-02 DIAGNOSIS — R0602 Shortness of breath: Secondary | ICD-10-CM

## 2023-09-02 DIAGNOSIS — Z0184 Encounter for antibody response examination: Secondary | ICD-10-CM

## 2023-09-02 DIAGNOSIS — Z7984 Long term (current) use of oral hypoglycemic drugs: Secondary | ICD-10-CM

## 2023-09-02 MED ORDER — METFORMIN HCL 850 MG PO TABS: 850.0000 mg | ORAL_TABLET | Freq: Two times a day (BID) | ORAL | 1 refills | Status: DC

## 2023-09-02 MED ORDER — LOSARTAN POTASSIUM 25 MG PO TABS: 25.0000 mg | ORAL_TABLET | Freq: Every day | ORAL | 0 refills | Status: DC

## 2023-09-02 MED ORDER — AMLODIPINE BESYLATE 5 MG PO TABS
5.0000 mg | ORAL_TABLET | Freq: Every day | ORAL | 0 refills | Status: AC
Start: 2023-09-02 — End: 2023-12-01

## 2023-09-02 NOTE — Progress Notes (Signed)
 New Patient Office Visit  Subjective    Patient ID: Barbara Bruce female  DOB: 07/22/1985  Age: 38 y.o. MRN: [id number]   CC:  Reestablishing care   Hypertension    Barbara Bruce is a 38 year old obese female who is needing blood pressure medication refilled and expresses problems with urinary incontinence.  Will give her a 30-day supply of medication and have her to return for fasting labs.Patient has No headache, No chest pain, No abdominal pain - No Nausea, No new weakness tingling or numbness, No Cough .  Barbara Bruce does have shortness of breath and diagnosis of asthma on inhalers and  nebulizer.  Patient informs Barbara Bruce is also on oxygen  2liters uses daily as needed. Current Outpatient Medications on File Prior to Visit  Medication Sig Dispense Refill   acetaminophen  (TYLENOL ) 500 MG tablet Take 500 mg by mouth every 6 (six) hours as needed for moderate pain (pain score 4-6).     albuterol  (PROVENTIL ) (2.5 MG/3ML) 0.083% nebulizer solution Take 3 mLs (2.5 mg total) by nebulization every 6 (six) hours as needed for wheezing or shortness of breath. 75 mL 12   albuterol  (VENTOLIN  HFA) 108 (90 Base) MCG/ACT inhaler Inhale 2 puffs into the lungs every 4 (four) hours as needed for wheezing or shortness of breath. 8 g 2   amoxicillin -clavulanate (AUGMENTIN ) 875-125 MG tablet Take 1 tablet by mouth every 12 (twelve) hours. 14 tablet 0   Blood Glucose Monitoring Suppl DEVI 1 each by Does not apply route in the morning, at noon, and at bedtime. May substitute to any manufacturer covered by patient's insurance. 1 each 0   fluticasone  (FLOVENT  HFA) 110 MCG/ACT inhaler Inhale 2 puffs into the lungs 2 (two) times daily. 1 each 2   HYDROcodone -acetaminophen  (NORCO) 5-325 MG tablet Take 1 tablet by mouth every 6 (six) hours as needed for severe pain (pain score 7-10). 12 tablet 0   ibuprofen  (ADVIL ) 800 MG tablet Take 1 tablet (800 mg total) by mouth every 8 (eight) hours as needed for moderate pain (pain score  4-6). 90 tablet 0   loratadine  (CLARITIN ) 10 MG tablet Take 1 tablet (10 mg total) by mouth daily. 30 tablet 0   nicotine  (NICODERM CQ  - DOSED IN MG/24 HOURS) 14 mg/24hr patch Place 1 patch (14 mg total) onto the skin daily. 28 patch 0   No current facility-administered medications on file prior to visit.     No Known Allergies  Past Medical History:  Diagnosis Date   Asthma    GERD (gastroesophageal reflux disease)    Glands swollen    Per client, in neck   History of anemia    History of blood transfusion    History of breast lump    History of shortness of breath    History of TMJ disorder    History of weight gain    Hypertension    Plantar fasciitis, bilateral    Polysubstance abuse (HCC)    repeatedly positive for cocaine, THC   Ulcer      Past Surgical History:  Procedure Laterality Date   history of tooth extraction     12 teeth removed under general anesthesia in 2020.   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  Tobacco Use   Smoking status: Every Day    Current packs/day: 1.00    Average packs/day: 1 pack/day for 12.0 years (12.0 ttl pk-yrs)    Types: Cigarettes   Smokeless tobacco: Never  Vaping Use   Vaping status: Never Used  Substance and Sexual Activity   Alcohol use: Yes    Comment: Reports last ETOH use 02/03/2020   Drug use: Yes    Types: Marijuana   Sexual activity: Yes    Birth control/protection: None  Other Topics Concern   Not on file  Social History Narrative   Not on file   Social Drivers  of Health   Financial Resource Strain: Not on file  Food Insecurity: No Food Insecurity (01/05/2023)   Hunger Vital Sign    Worried About Running Out of Food in the Last Year: Never true    Ran Out of Food in the Last Year: Never true  Transportation Needs: No Transportation Needs (01/05/2023)   PRAPARE - Administrator, Civil Service (Medical): No    Lack of Transportation (Non-Medical): No  Physical Activity: Not on file  Stress: Not on file  Social Connections: Not on file  Intimate Partner Violence: Not At Risk (01/05/2023)   Humiliation, Afraid, Rape, and Kick questionnaire    Fear of Current or Ex-Partner: No    Emotionally Abused: No    Physically Abused: No    Sexually Abused: No       Health Maintenance  Topic Date Due   Yearly kidney health urinalysis for diabetes  Never done   Pneumococcal Vaccination (1 of 2 - PCV) Never done   Hepatitis B Vaccine (1 of 3 - 19+ 3-dose series) Never done   HPV Vaccine (1 - 3-dose SCDM series) Never done   COVID-19 Vaccine (1 - 2024-25 season) Never done   Pap with HPV screening  03/02/2023   Flu Shot  09/10/2023   Yearly kidney function blood test for diabetes  01/06/2024   DTaP/Tdap/Td vaccine (2 - Td or Tdap) 08/13/2024   Hepatitis C Screening  Completed   HIV Screening  Completed   Meningitis B Vaccine  Aged Out    Objective   BP (!) 144/85   Pulse 95   Resp 16   Wt 194 lb 6.4 oz (88.2 kg)   SpO2 98%   BMI 42.07 kg/m    Physical Exam Vitals reviewed.  Constitutional:      Appearance: Normal appearance. Barbara Bruce is obese.  HENT:     Head: Normocephalic.     Right Ear: Tympanic membrane, ear canal and external ear normal.     Left Ear: Tympanic membrane, ear canal and external ear normal.     Nose: Nose normal.     Mouth/Throat:     Mouth: Mucous membranes are moist.  Eyes:     Extraocular Movements: Extraocular movements intact.     Pupils: Pupils are equal, round, and reactive to light.   Cardiovascular:     Rate and Rhythm: Normal rate and regular rhythm.  Pulmonary:     Effort: Pulmonary effort is normal.     Breath sounds: Normal breath sounds.  Abdominal:  General: Bowel sounds are normal.     Palpations: Abdomen is soft.  Musculoskeletal:        General: Normal range of motion.     Cervical back: Normal range of motion and neck supple.  Skin:    General: Skin is warm and dry.  Neurological:     Mental Status: Barbara Bruce is alert and oriented to person, place, and time.  Psychiatric:        Mood and Affect: Mood normal.        Behavior: Behavior normal.        Thought Content: Thought content normal.        Assessment & Plan:  Barbara Bruce was seen today for incontience supplies and hypertension.  Diagnoses and all orders for this visit:  Immunity status testing -     Hepatitis B surface antibody,qualitative; Future  Essential hypertension BP goal - < 130/80 Explained that having normal blood pressure is the goal and medications are helping to get to goal and maintain normal blood pressure. DIET: Limit salt intake, read nutrition labels to check salt content, limit fried and high fatty foods  Avoid using multisymptom OTC cold preparations that generally contain sudafed which can rise BP. Consult with pharmacist on best cold relief products to use for persons with HTN EXERCISE Discussed incorporating exercise such as walking - 30 minutes most days of the week and can do in 10 minute intervals    -     CMP14+EGFR; Future  Shortness of breath 2/2 severe morbid obesity(hypoventilation) Discussed diet and exercise for person with BMI >25. Instructed: You must burn more calories than you eat. Losing 5 percent of your body weight should be considered a success. In the longer term, losing more than 15 percent of your body weight and staying at this weight is an extremely good result. However, keep in mind that even losing 5 percent of your body weight leads to important  health benefits, so try not to get discouraged if you're not able to lose more than this. Will recheck weight in 3-6 months.   Type 2 diabetes mellitus without complication, without long-term current use of insulin  (HCC) -     CBC with Differential/Platelet; Future -     CMP14+EGFR; Future -     Lipid panel; Future  Mixed stress and urge urinary incontinence Wears depends   Other orders -     amLODipine  (NORVASC ) 5 MG tablet; Take 1 tablet (5 mg total) by mouth daily. -     losartan  (COZAAR ) 25 MG tablet; Take 1 tablet (25 mg total) by mouth daily. -     metFORMIN  (GLUCOPHAGE ) 850 MG tablet; Take 1 tablet (850 mg total) by mouth 2 (two) times daily with a meal.       Follow-up:  Return in about 3 months (around 12/03/2023) for fasting labs.  The above assessment and management plan was discussed with the patient. The patient verbalized understanding of and has agreed to the management plan. Patient is aware to call the clinic if symptoms fail to improve or worsen. Patient is aware when to return to the clinic for a follow-up visit. Patient educated on when it is appropriate to go to the emergency department.   Rosaline Bohr, NP-C

## 2023-09-08 ENCOUNTER — Other Ambulatory Visit (INDEPENDENT_AMBULATORY_CARE_PROVIDER_SITE_OTHER)

## 2023-09-08 ENCOUNTER — Encounter (INDEPENDENT_AMBULATORY_CARE_PROVIDER_SITE_OTHER): Payer: Self-pay | Admitting: Primary Care

## 2023-09-08 DIAGNOSIS — Z7984 Long term (current) use of oral hypoglycemic drugs: Secondary | ICD-10-CM | POA: Diagnosis not present

## 2023-09-08 DIAGNOSIS — Z0184 Encounter for antibody response examination: Secondary | ICD-10-CM | POA: Diagnosis not present

## 2023-09-08 DIAGNOSIS — E119 Type 2 diabetes mellitus without complications: Secondary | ICD-10-CM

## 2023-09-08 DIAGNOSIS — I10 Essential (primary) hypertension: Secondary | ICD-10-CM | POA: Diagnosis not present

## 2023-09-09 ENCOUNTER — Ambulatory Visit: Payer: Self-pay | Admitting: Primary Care

## 2023-09-09 LAB — CMP14+EGFR
ALT: 14 IU/L (ref 0–32)
AST: 15 IU/L (ref 0–40)
Albumin: 4.3 g/dL (ref 3.9–4.9)
Alkaline Phosphatase: 86 IU/L (ref 44–121)
BUN/Creatinine Ratio: 9 (ref 9–23)
BUN: 5 mg/dL — ABNORMAL LOW (ref 6–20)
Bilirubin Total: 0.2 mg/dL (ref 0.0–1.2)
CO2: 24 mmol/L (ref 20–29)
Calcium: 9.5 mg/dL (ref 8.7–10.2)
Chloride: 98 mmol/L (ref 96–106)
Creatinine, Ser: 0.57 mg/dL (ref 0.57–1.00)
Globulin, Total: 2.9 g/dL (ref 1.5–4.5)
Glucose: 226 mg/dL — ABNORMAL HIGH (ref 70–99)
Potassium: 4.1 mmol/L (ref 3.5–5.2)
Sodium: 137 mmol/L (ref 134–144)
Total Protein: 7.2 g/dL (ref 6.0–8.5)
eGFR: 120 mL/min/1.73 (ref >59)

## 2023-09-09 LAB — CBC WITH DIFFERENTIAL/PLATELET
Basophils Absolute: 0.1 x10E3/uL (ref 0.0–0.2)
Basos: 1 %
EOS (ABSOLUTE): 0.3 x10E3/uL (ref 0.0–0.4)
Eos: 2 %
Hematocrit: 42.9 % (ref 34.0–46.6)
Hemoglobin: 12.9 g/dL (ref 11.1–15.9)
Immature Grans (Abs): 0 x10E3/uL (ref 0.0–0.1)
Immature Granulocytes: 0 %
Lymphocytes Absolute: 3.4 x10E3/uL — ABNORMAL HIGH (ref 0.7–3.1)
Lymphs: 25 %
MCH: 25 pg — ABNORMAL LOW (ref 26.6–33.0)
MCHC: 30.1 g/dL — ABNORMAL LOW (ref 31.5–35.7)
MCV: 83 fL (ref 79–97)
Monocytes Absolute: 0.7 x10E3/uL (ref 0.1–0.9)
Monocytes: 5 %
Neutrophils Absolute: 8.8 x10E3/uL — ABNORMAL HIGH (ref 1.4–7.0)
Neutrophils: 67 %
Platelets: 309 x10E3/uL (ref 150–450)
RBC: 5.17 x10E6/uL (ref 3.77–5.28)
RDW: 14.7 % (ref 11.7–15.4)
WBC: 13.3 x10E3/uL — ABNORMAL HIGH (ref 3.4–10.8)

## 2023-09-09 LAB — MICROALBUMIN / CREATININE URINE RATIO
Creatinine, Urine: 177.6 mg/dL
Microalb/Creat Ratio: 107 mg/g{creat} — ABNORMAL HIGH (ref 0–29)
Microalbumin, Urine: 189.4 ug/mL

## 2023-09-09 LAB — LIPID PANEL
Chol/HDL Ratio: 4.5 ratio — ABNORMAL HIGH (ref 0.0–4.4)
Cholesterol, Total: 183 mg/dL (ref 100–199)
HDL: 41 mg/dL (ref >39)
LDL Chol Calc (NIH): 120 mg/dL — ABNORMAL HIGH (ref 0–99)
Triglycerides: 121 mg/dL (ref 0–149)
VLDL Cholesterol Cal: 22 mg/dL (ref 5–40)

## 2023-09-09 LAB — HEPATITIS B SURFACE ANTIBODY,QUALITATIVE: Hep B Surface Ab, Qual: NONREACTIVE

## 2023-09-09 MED ORDER — ATORVASTATIN CALCIUM 10 MG PO TABS: 40.0000 mg | ORAL_TABLET | Freq: Every day | ORAL | 1 refills | Status: AC

## 2023-09-14 ENCOUNTER — Telehealth: Payer: Self-pay

## 2023-09-14 NOTE — Telephone Encounter (Signed)
 Copied from CRM #[id number]. Topic: General - Other >> Sep 14, 2023  8:50 AM Wess RAMAN wrote: Reason for CRM: Harlene from [employer] would like to know if paperwork was received via fax for incontinenance supplies.  Callback #: [phone]

## 2023-09-14 NOTE — Telephone Encounter (Signed)
 Routing to CMA

## 2023-09-24 ENCOUNTER — Telehealth: Payer: Self-pay

## 2023-09-24 NOTE — Telephone Encounter (Signed)
 Copied from CRM #[id number]. Topic: Referral - Status >> Sep 23, 2023 12:49 PM Zy'onna H wrote: Reason for CRM:  Harlene from [employer] reached out to ask if the clinic or the PCP can sign the forms listed in the patient chart -  for patient to receive her Diaper Supply from [employer].   Callback Number: [phone]

## 2023-09-24 NOTE — Telephone Encounter (Signed)
 Office notes has been faxed to [employer].

## 2023-10-01 NOTE — Telephone Encounter (Signed)
 Ave you completed the [employer] form for this patient.  If not print it out from [employer] and sign it and get CMA to fax.

## 2023-10-13 DIAGNOSIS — R32 Unspecified urinary incontinence: Secondary | ICD-10-CM | POA: Diagnosis not present

## 2023-10-13 DIAGNOSIS — I10 Essential (primary) hypertension: Secondary | ICD-10-CM | POA: Diagnosis not present

## 2023-10-21 DIAGNOSIS — J4551 Severe persistent asthma with (acute) exacerbation: Secondary | ICD-10-CM | POA: Diagnosis not present

## 2023-10-29 ENCOUNTER — Other Ambulatory Visit (INDEPENDENT_AMBULATORY_CARE_PROVIDER_SITE_OTHER): Payer: Self-pay | Admitting: *Deleted

## 2023-10-29 DIAGNOSIS — I10 Essential (primary) hypertension: Secondary | ICD-10-CM

## 2023-10-29 MED ORDER — LOSARTAN POTASSIUM 25 MG PO TABS: 25.0000 mg | ORAL_TABLET | Freq: Every day | ORAL | 0 refills | Status: AC

## 2023-11-16 ENCOUNTER — Telehealth: Payer: Self-pay

## 2023-11-16 NOTE — Telephone Encounter (Signed)
 Patient and provider unknown, sent to wrong office

## 2023-11-16 NOTE — Telephone Encounter (Signed)
 Copied from CRM [id number]. Topic: General - Other >> Nov 15, 2023  5:27 PM Mercer PEDLAR wrote: Reason for CRM: Warren calling from [facility] regarding fax of order for Omnipod insulin  pump starter kit and pod refill. She stated that it was sent on 10/27/23 and she resent it today 11/15/23. Warren would like a callback to confirm if it was received.   Callback: [phone]

## 2023-11-23 ENCOUNTER — Other Ambulatory Visit: Payer: Self-pay | Admitting: Medical Genetics

## 2023-11-29 IMAGING — CR DG SHOULDER 2+V*R*
2 series · 2 of 2 positions shown · non-contrast
Comparison: None.

CLINICAL DATA: Fall with shoulder pain

EXAM:
RIGHT SHOULDER - 2+ VIEW

[w shoulder external right]
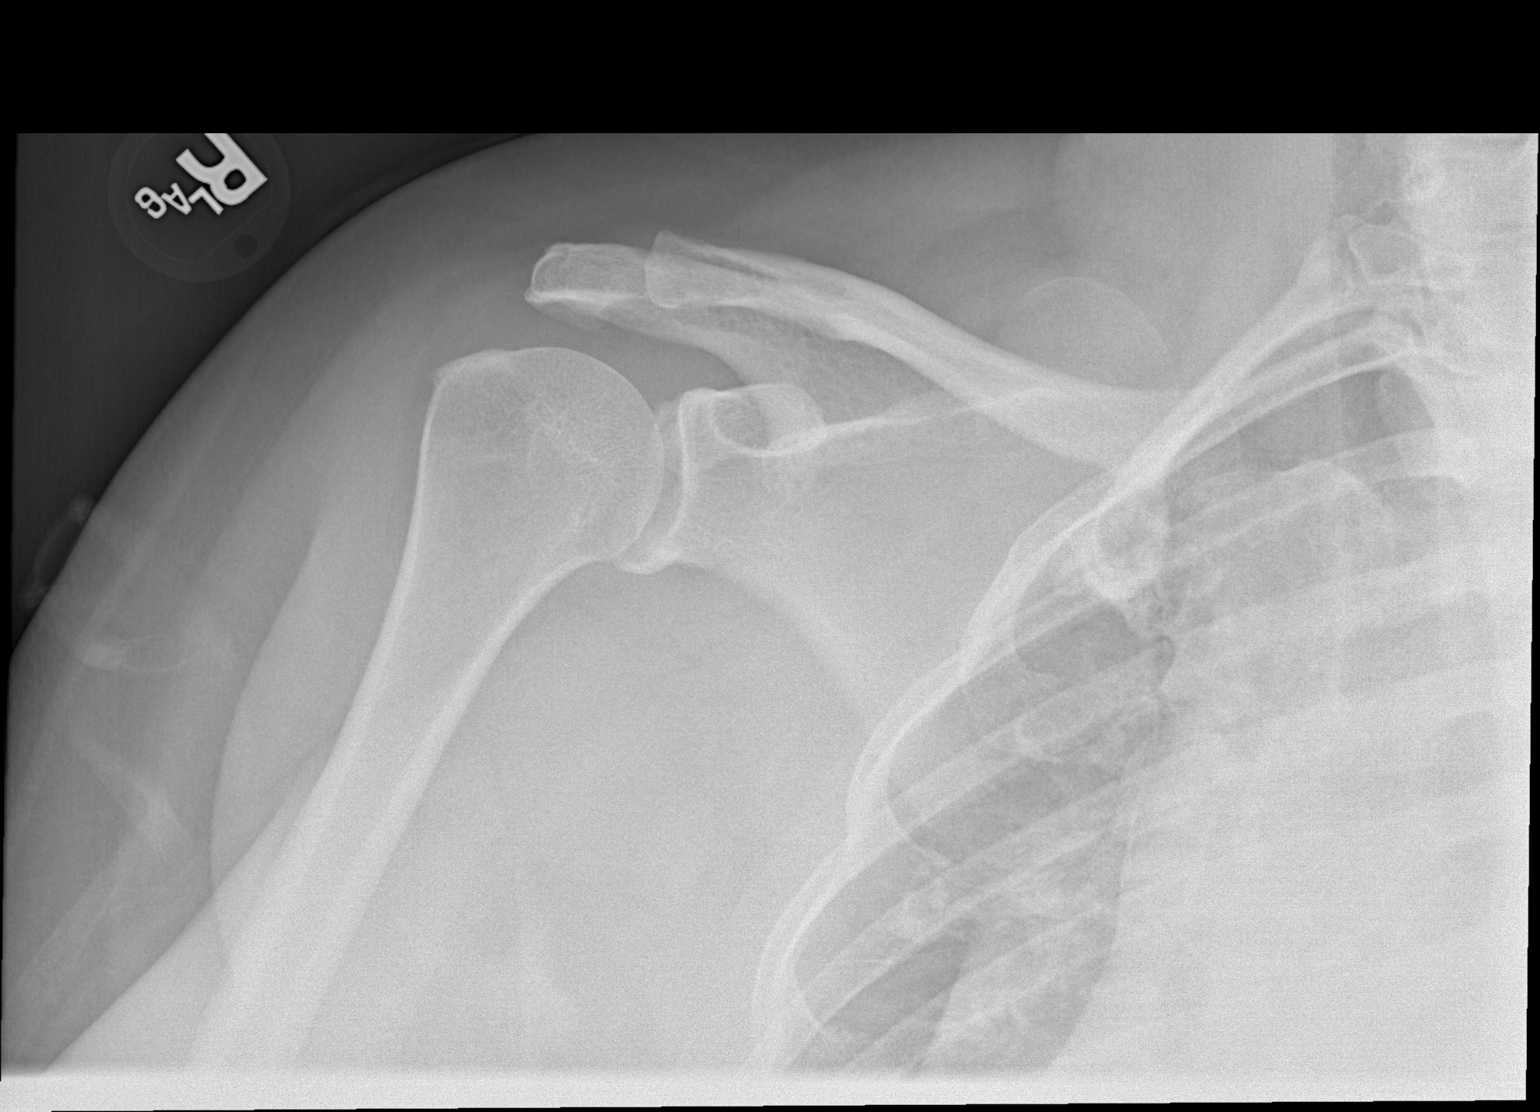

[w shoulder y-view right]
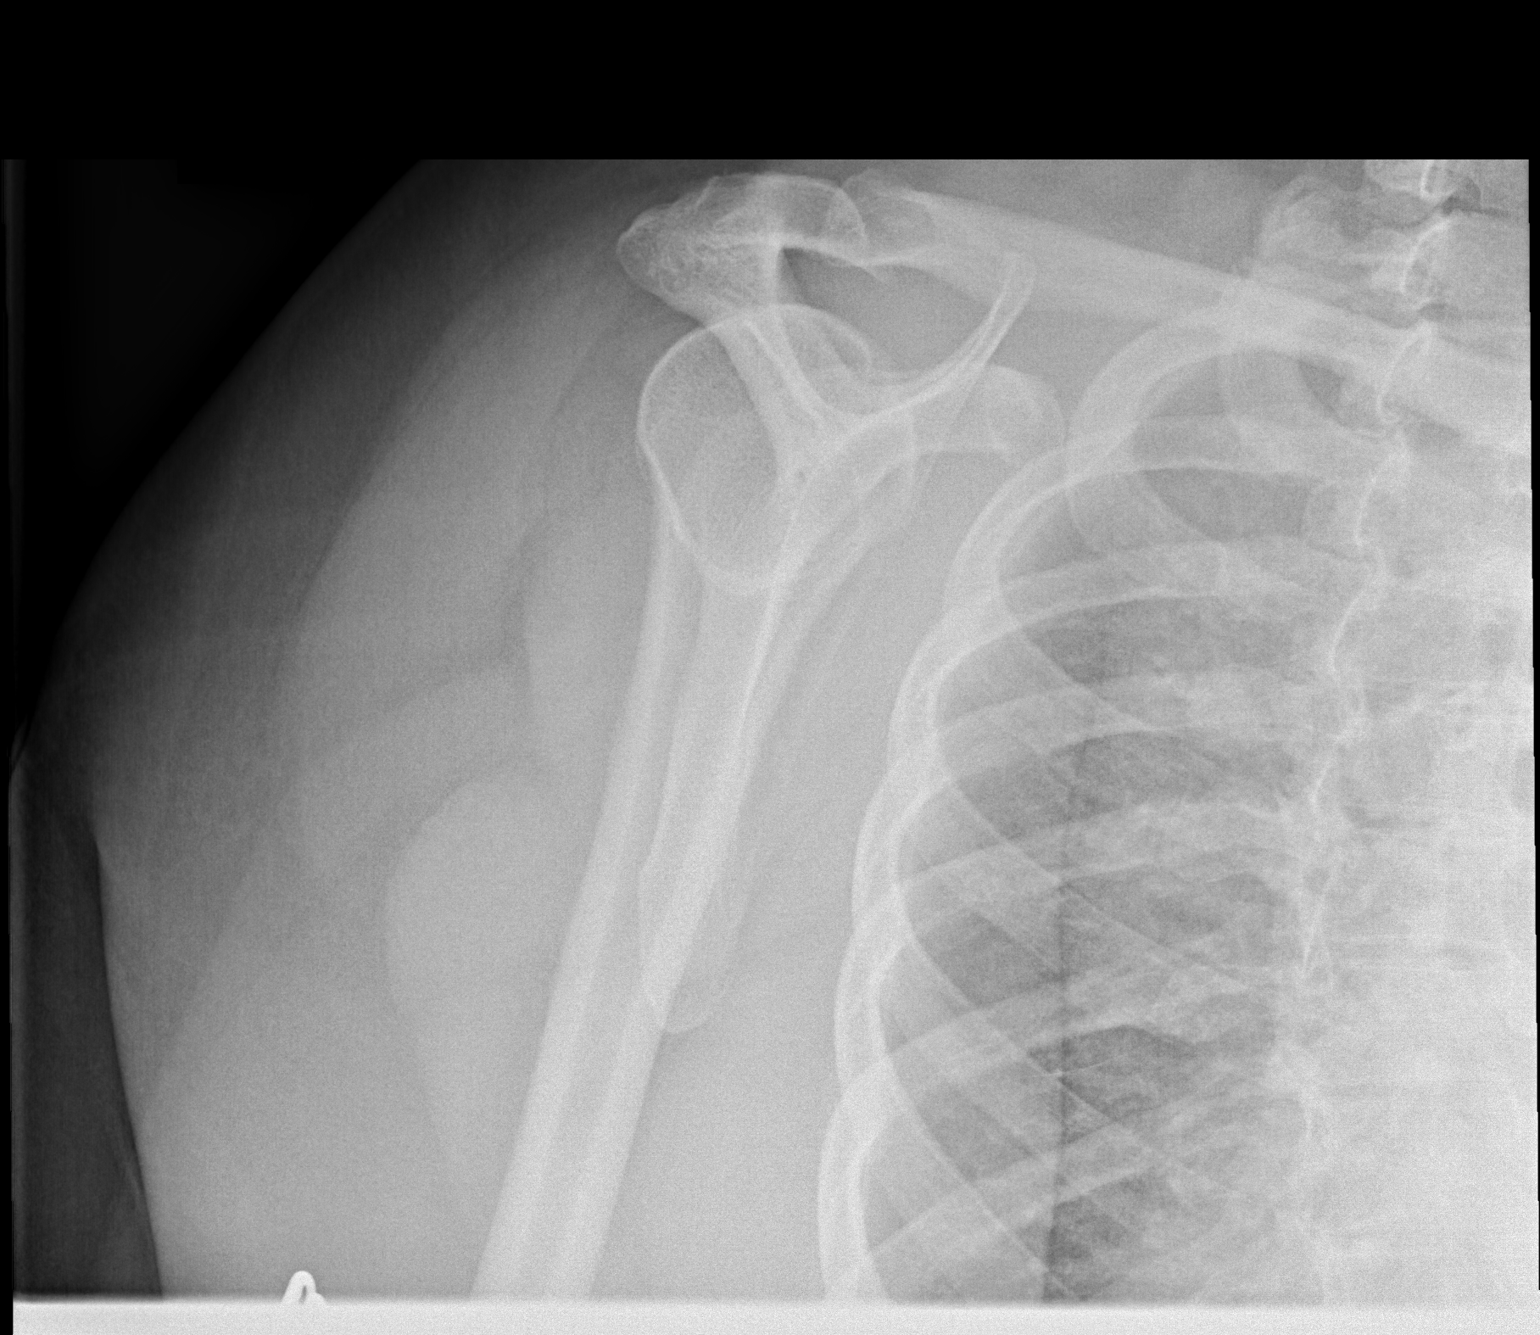

[2 of 2 positions shown; findings below may reference images not displayed]

FINDINGS: There is no evidence of fracture or dislocation. There is no
evidence of arthropathy or other focal bone abnormality. Soft
tissues are unremarkable.
IMPRESSION: Negative.

## 2023-12-21 DIAGNOSIS — J4551 Severe persistent asthma with (acute) exacerbation: Secondary | ICD-10-CM | POA: Diagnosis not present

## 2023-12-28 ENCOUNTER — Telehealth (HOSPITAL_COMMUNITY): Payer: Self-pay

## 2023-12-28 ENCOUNTER — Other Ambulatory Visit: Payer: Self-pay

## 2023-12-28 ENCOUNTER — Encounter (HOSPITAL_COMMUNITY): Payer: Self-pay

## 2023-12-28 ENCOUNTER — Emergency Department (HOSPITAL_COMMUNITY)
Admission: EM | Admit: 2023-12-28 | Discharge: 2023-12-28 | Disposition: A | Discharge status: Home / Self Care | Attending: Emergency Medicine | Admitting: Emergency Medicine

## 2023-12-28 ENCOUNTER — Emergency Department (HOSPITAL_COMMUNITY)

## 2023-12-28 DIAGNOSIS — J209 Acute bronchitis, unspecified: Secondary | ICD-10-CM | POA: Insufficient documentation

## 2023-12-28 DIAGNOSIS — E1165 Type 2 diabetes mellitus with hyperglycemia: Secondary | ICD-10-CM | POA: Diagnosis not present

## 2023-12-28 DIAGNOSIS — D72829 Elevated white blood cell count, unspecified: Secondary | ICD-10-CM | POA: Insufficient documentation

## 2023-12-28 DIAGNOSIS — J45909 Unspecified asthma, uncomplicated: Secondary | ICD-10-CM | POA: Insufficient documentation

## 2023-12-28 DIAGNOSIS — R0789 Other chest pain: Secondary | ICD-10-CM

## 2023-12-28 DIAGNOSIS — Z79899 Other long term (current) drug therapy: Secondary | ICD-10-CM | POA: Insufficient documentation

## 2023-12-28 DIAGNOSIS — I10 Essential (primary) hypertension: Secondary | ICD-10-CM | POA: Insufficient documentation

## 2023-12-28 DIAGNOSIS — R739 Hyperglycemia, unspecified: Secondary | ICD-10-CM

## 2023-12-28 DIAGNOSIS — E871 Hypo-osmolality and hyponatremia: Secondary | ICD-10-CM | POA: Diagnosis not present

## 2023-12-28 DIAGNOSIS — R079 Chest pain, unspecified: Secondary | ICD-10-CM | POA: Diagnosis not present

## 2023-12-28 LAB — BASIC METABOLIC PANEL WITH GFR
Anion gap: 9 (ref 5–15)
BUN: 8 mg/dL (ref 6–20)
CO2: 29 mmol/L (ref 22–32)
Calcium: 9.9 mg/dL (ref 8.9–10.3)
Chloride: 95 mmol/L — ABNORMAL LOW (ref 98–111)
Creatinine, Ser: 0.61 mg/dL (ref 0.44–1.00)
GFR, Estimated: >60 mL/min (ref >60)
Glucose, Bld: 457 mg/dL — ABNORMAL HIGH (ref 70–99)
Potassium: 4.2 mmol/L (ref 3.5–5.1)
Sodium: 133 mmol/L — ABNORMAL LOW (ref 135–145)

## 2023-12-28 LAB — CBC
HCT: 39.2 % (ref 36.0–46.0)
Hemoglobin: 12.6 g/dL (ref 12.0–15.0)
MCH: 26.7 pg (ref 26.0–34.0)
MCHC: 32.1 g/dL (ref 30.0–36.0)
MCV: 83.1 fL (ref 80.0–100.0)
Platelets: 297 K/uL (ref 150–400)
RBC: 4.72 MIL/uL (ref 3.87–5.11)
RDW: 14.9 % (ref 11.5–15.5)
WBC: 15.1 K/uL — ABNORMAL HIGH (ref 4.0–10.5)
nRBC: 0 % (ref 0.0–0.2)

## 2023-12-28 LAB — RESP PANEL BY RT-PCR (RSV, FLU A&B, COVID)  RVPGX2
Influenza A by PCR: NEGATIVE
Influenza B by PCR: NEGATIVE
Resp Syncytial Virus by PCR: NEGATIVE
SARS Coronavirus 2 by RT PCR: NEGATIVE

## 2023-12-28 LAB — TROPONIN T, HIGH SENSITIVITY
Troponin T High Sensitivity: <15 ng/L (ref 0–19)
Troponin T High Sensitivity: <15 ng/L (ref 0–19)

## 2023-12-28 LAB — HCG, SERUM, QUALITATIVE: Preg, Serum: NEGATIVE

## 2023-12-28 MED ORDER — KETOROLAC TROMETHAMINE 30 MG/ML IJ SOLN
30.0000 mg | Freq: Once | INTRAMUSCULAR | Status: AC
  Administered 2023-12-28: 30 mg via INTRAVENOUS
  Filled 2023-12-28: qty 1

## 2023-12-28 MED ORDER — IPRATROPIUM-ALBUTEROL 0.5-2.5 (3) MG/3ML IN SOLN
3.0000 mL | Freq: Once | RESPIRATORY_TRACT | Status: AC
  Administered 2023-12-28: 3 mL via RESPIRATORY_TRACT
  Filled 2023-12-28: qty 3

## 2023-12-28 MED ORDER — AZITHROMYCIN 250 MG PO TABS: 500.0000 mg | ORAL_TABLET | Freq: Every day | ORAL | 0 refills | Status: AC

## 2023-12-28 MED ORDER — ALBUTEROL SULFATE (2.5 MG/3ML) 0.083% IN NEBU: 2.5000 mg | INHALATION_SOLUTION | Freq: Four times a day (QID) | RESPIRATORY_TRACT | 12 refills | Status: AC | PRN

## 2023-12-28 MED ORDER — ALBUTEROL SULFATE HFA 108 (90 BASE) MCG/ACT IN AERS: 1.0000 | INHALATION_SPRAY | Freq: Four times a day (QID) | RESPIRATORY_TRACT | 0 refills | Status: AC | PRN

## 2023-12-28 MED ORDER — ONDANSETRON HCL 4 MG/2ML IJ SOLN
4.0000 mg | Freq: Once | INTRAMUSCULAR | Status: AC
  Administered 2023-12-28: 4 mg via INTRAVENOUS
  Filled 2023-12-28: qty 2

## 2023-12-28 MED ORDER — METFORMIN HCL 1000 MG PO TABS: 1000.0000 mg | ORAL_TABLET | Freq: Two times a day (BID) | ORAL | 0 refills | Status: AC

## 2023-12-28 MED ORDER — OXYCODONE HCL 5 MG PO TABS
5.0000 mg | ORAL_TABLET | ORAL | 0 refills | Status: AC | PRN
Start: 2023-12-28 — End: ?

## 2023-12-28 MED ORDER — OXYCODONE-ACETAMINOPHEN 5-325 MG PO TABS
1.0000 | ORAL_TABLET | Freq: Once | ORAL | Status: AC
  Administered 2023-12-28: 1 via ORAL
  Filled 2023-12-28: qty 1

## 2023-12-28 MED ORDER — OXYCODONE-ACETAMINOPHEN 5-325 MG PO TABS: 4.0000 | ORAL_TABLET | Freq: Four times a day (QID) | ORAL | 0 refills | Status: AC | PRN

## 2023-12-28 MED ORDER — METFORMIN HCL 500 MG PO TABS
1000.0000 mg | ORAL_TABLET | Freq: Once | ORAL | Status: AC
  Administered 2023-12-28: 1000 mg via ORAL
  Filled 2023-12-28: qty 2

## 2023-12-28 MED ORDER — METFORMIN HCL 1000 MG PO TABS: 1000.0000 mg | ORAL_TABLET | Freq: Two times a day (BID) | ORAL | 1 refills | Status: AC

## 2023-12-28 MED ORDER — METFORMIN HCL 1000 MG PO TABS: 1000.0000 mg | ORAL_TABLET | Freq: Two times a day (BID) | ORAL | 1 refills | Status: DC

## 2023-12-28 MED ORDER — AZITHROMYCIN 250 MG PO TABS
250.0000 mg | ORAL_TABLET | Freq: Every day | ORAL | 0 refills | Status: AC
Start: 2023-12-28 — End: ?

## 2023-12-28 NOTE — Discharge Instructions (Addendum)
 Continue using your nebulizer every 4 hours as needed.  Your blood sugars have been running high.  Please change your metformin  to the new dose of 1000 mg twice a day.  Take acetaminophen  and/or ibuprofen  as needed for pain.  Reserve oxycodone  for pain not controlled by the combination of acetaminophen  and ibuprofen .  Continue using your home oxygen  as needed.  Return to the emergency department if symptoms are not being adequately controlled at home.

## 2023-12-28 NOTE — ED Triage Notes (Signed)
 Pt reports with left sided chest pain, and cough x 3 days. Pt states that her left arm is hurting. Pt has congestion.

## 2023-12-28 NOTE — Telephone Encounter (Signed)
Patient needs meds sent to different pharmacy.

## 2023-12-28 NOTE — ED Provider Notes (Signed)
 [facility] EMERGENCY [facility]MENT AT [facility] Provider Note   CSN: [id number] Arrival date & time: 12/28/23  0031     Patient presents with: Chest Pain   Barbara Bruce is a 38 y.o. female.   The history is provided by the patient.  Chest Pain  She has history of hypertension, diabetes, asthma, GERD and comes in because of pain in the left side of her chest radiating to her shoulder and arm and neck.  Pain was present when she woke up at about midnight.  Pain is worse with moving in any direction.  She states that she has had a cough for the last 2 days which is productive of white sputum.  She denies fever chills or sweats.  There has been some nausea and she did vomit once.  She also noted initial numbness in the fingers of both hands-this has resolved.  She does endorse being short of breath.  She denies any sick contacts.  She is a cigarette smoker, no history of hyperlipidemia but she is on a statin.  There is no family history of premature coronary atherosclerosis.    Prior to Admission medications   Medication Sig Start Date End Date Taking? Authorizing Provider  acetaminophen  (TYLENOL ) 500 MG tablet Take 500 mg by mouth every 6 (six) hours as needed for moderate pain (pain score 4-6).    [provider]  albuterol  (PROVENTIL ) (2.5 MG/3ML) 0.083% nebulizer solution Take 3 mLs (2.5 mg total) by nebulization every 6 (six) hours as needed for wheezing or shortness of breath. 01/07/23   Pokhrel, Vernal, MD  albuterol  (VENTOLIN  HFA) 108 (90 Base) MCG/ACT inhaler Inhale 2 puffs into the lungs every 4 (four) hours as needed for wheezing or shortness of breath. 01/07/23   Pokhrel, Laxman, MD  amLODipine  (NORVASC ) 5 MG tablet Take 1 tablet (5 mg total) by mouth daily. 09/02/23 12/01/23  Celestia Rosaline SQUIBB, NP  amoxicillin -clavulanate (AUGMENTIN ) 875-125 MG tablet Take 1 tablet by mouth every 12 (twelve) hours. 02/05/23   Patt Alm Macho, MD  atorvastatin  (LIPITOR) 10  MG tablet Take 4 tablets (40 mg total) by mouth daily. 09/09/23   Celestia Rosaline SQUIBB, NP  Blood Glucose Monitoring Suppl DEVI 1 each by Does not apply route in the morning, at noon, and at bedtime. May substitute to any manufacturer covered by patient's insurance. 01/07/23   Pokhrel, Laxman, MD  fluticasone  (FLOVENT  HFA) 110 MCG/ACT inhaler Inhale 2 puffs into the lungs 2 (two) times daily. 01/07/23 01/07/24  Pokhrel, Laxman, MD  HYDROcodone -acetaminophen  (NORCO) 5-325 MG tablet Take 1 tablet by mouth every 6 (six) hours as needed for severe pain (pain score 7-10). 01/07/23   Pokhrel, Vernal, MD  ibuprofen  (ADVIL ) 800 MG tablet Take 1 tablet (800 mg total) by mouth every 8 (eight) hours as needed for moderate pain (pain score 4-6). 04/07/23   Celestia Rosaline SQUIBB, NP  loratadine  (CLARITIN ) 10 MG tablet Take 1 tablet (10 mg total) by mouth daily. 01/07/23   Pokhrel, Laxman, MD  losartan  (COZAAR ) 25 MG tablet Take 1 tablet (25 mg total) by mouth daily. 10/29/23 01/27/24  Celestia Rosaline SQUIBB, NP  metFORMIN  (GLUCOPHAGE ) 850 MG tablet Take 1 tablet (850 mg total) by mouth 2 (two) times daily with a meal. 09/02/23 11/01/23  Celestia Rosaline SQUIBB, NP  nicotine  (NICODERM CQ  - DOSED IN MG/24 HOURS) 14 mg/24hr patch Place 1 patch (14 mg total) onto the skin daily. 01/07/23   Sonjia Vernal, MD    Allergies: Patient  has no known allergies.    Review of Systems  Cardiovascular:  Positive for chest pain.  All other systems reviewed and are negative.   Updated Vital Signs BP (!) 169/93   Pulse 86   Temp 98.1 F (36.7 C) (Oral)   Resp (!) 21   Ht 4' 9 (1.448 m)   Wt 95.3 kg   LMP 12/19/2023   SpO2 96%   BMI 45.44 kg/m   Physical Exam Vitals and nursing note reviewed.   38 year old female, resting comfortably and in no acute distress. Vital signs are significant for elevated blood pressure and borderline elevated respiratory rate. Oxygen  saturation is 96%, which is normal. Head is normocephalic and  atraumatic. PERRLA, EOMI.  Neck is supple with mild tenderness to palpation in the soft tissues of the left side of the neck. Back is nontender and there is no CVA tenderness. Lungs have coarse inspiratory and expiratory wheezes, no rales or rhonchi. Chest is moderately tender across the left anterior chest wall.  There is no crepitus. Heart has regular rate and rhythm without murmur. Abdomen is soft, flat, nontender. Extremities have no cyanosis or edema, full range of motion is present. Skin is warm and dry without rash. Neurologic: Mental status is normal, cranial nerves are intact, moves all extremities equally.  (all labs ordered are listed, but only abnormal results are displayed) Labs Reviewed  CBC - Abnormal; Notable for the following components:      Result Value   WBC 15.1 (*)    All other components within normal limits  BASIC METABOLIC PANEL WITH GFR - Abnormal; Notable for the following components:   Sodium 133 (*)    Chloride 95 (*)    Glucose, Bld 457 (*)    All other components within normal limits  HCG, SERUM, QUALITATIVE  TROPONIN T, HIGH SENSITIVITY  TROPONIN T, HIGH SENSITIVITY    EKG: EKG Interpretation Date/Time:  Tuesday December 28 2023 00:42:17 EST Ventricular Rate:  91 PR Interval:  120 QRS Duration:  73 QT Interval:  327 QTC Calculation: 403 R Axis:   62  Text Interpretation: Sinus rhythm Consider left ventricular hypertrophy When compared with ECG of 01/04/2023, No significant change was found Confirmed by Raford Lenis ([id number]) on 12/28/2023 1:06:58 AM  Radiology: DG Chest 2 View Result Date: 12/28/2023 EXAM: 2 VIEW(S) XRAY OF THE CHEST 12/28/2023 12:51:00 AM COMPARISON: None available. CLINICAL HISTORY: Chest pain Chest pain FINDINGS: LUNGS AND PLEURA: No focal pulmonary opacity. No pleural effusion. No pneumothorax. HEART AND MEDIASTINUM: No acute abnormality of the cardiac and mediastinal silhouettes. BONES AND SOFT TISSUES: No acute osseous  abnormality. IMPRESSION: 1. No acute process. Electronically signed by: Dorethia Molt MD 12/28/2023 12:53 AM EST RP Workstation: [id number]     Procedures   Medications Ordered in the ED  ketorolac  (TORADOL ) 30 MG/ML injection 30 mg (30 mg Intravenous Given 12/28/23 0334)  ondansetron  (ZOFRAN ) injection 4 mg (4 mg Intravenous Given 12/28/23 0334)  ipratropium-albuterol  (DUONEB) 0.5-2.5 (3) MG/3ML nebulizer solution 3 mL (3 mLs Nebulization Given 12/28/23 0333)  oxyCODONE -acetaminophen  (PERCOCET/ROXICET) 5-325 MG per tablet 1 tablet (1 tablet Oral Given 12/28/23 0548)  ipratropium-albuterol  (DUONEB) 0.5-2.5 (3) MG/3ML nebulizer solution 3 mL (3 mLs Nebulization Given 12/28/23 0548)  ipratropium-albuterol  (DUONEB) 0.5-2.5 (3) MG/3ML nebulizer solution 3 mL (3 mLs Nebulization Given 12/28/23 0659)  metFORMIN  (GLUCOPHAGE ) tablet 1,000 mg (1,000 mg Oral Given 12/28/23 0658)  Medical Decision Making Amount and/or Complexity of Data Reviewed Labs: ordered. Radiology: ordered.  Risk Prescription drug management.   Chest pain in the setting of cough.  This a presentation with a wide range of treatment options and carries with it a high risk of morbidity and complications.  Differential diagnosis includes, but is not limited to, pneumonia, respiratory infection such as COVID-19 or influenza or RSV, ACS, pericarditis, chest wall pain.  I have reviewed her electrocardiogram, and my interpretation is possible LVH but no ST or T changes and unchanged from prior.  Chest x-ray shows no acute cardiopulmonary process.  I have independently viewed the images, and agree with the radiologist's interpretation.  I have reviewed her laboratory tests, and my interpretation is elevated glucose, mild hyponatremia appropriate to level of hyperglycemia, mild leukocytosis which is nonspecific, normal troponin with repeat troponin pending.  I have ordered a viral respiratory pathogen  PCR panel.  I have ordered ketorolac  for pain, ondansetron  for nausea, albuterol  and ipratropium via nebulizer for wheezing.  I have reviewed her past records, and note several ED visits for asthma most recently on 12/19/2020.  Repeat troponin is unchanged.  Respiratory pathogen panel is negative for COVID-19 and influenza and RSV.  Following nebulizer treatment, there was improved airflow and decreased wheezing but significant wheezing still present.  I ordered a second nebulizer treatment with for further improvement.  After third nebulizer treatment, lungs are completely clear.  She fell asleep following initial dose of ketorolac , but when she woke up she continued to complain of significant pain and I ordered a dose of oxycodone -acetaminophen .  This did give her adequate, but not complete relief of pain.  I am discharging her, but not giving her steroids because of elevated glucose.  She tells me that her blood sugars at home have been consistently high for several months despite metformin  850 mg twice a day.  I am electing to increase her metformin  to 1000 mg twice a day and I am giving her prescription for that.  I am also discharging her with a refill for her albuterol  for home nebulizer and a small number of oxycodone  tablets.  She is to use acetaminophen  and ibuprofen  as her main medications for pain relief.  I have also ordered a prescription for azithromycin .  She will need to follow-up with her primary care provider.  Return precautions have been discussed.     Final diagnoses:  Acute bronchitis, unspecified organism  Chest wall pain  Hyperglycemia    ED Discharge Orders          Ordered    metFORMIN  (GLUCOPHAGE ) 1000 MG tablet  2 times daily with meals,   Status:  Discontinued        12/28/23 0623    metFORMIN  (GLUCOPHAGE ) 1000 MG tablet  2 times daily with meals        12/28/23 0733    azithromycin  (ZITHROMAX ) 250 MG tablet  Daily        12/28/23 0737    albuterol  (PROVENTIL ) (2.5  MG/3ML) 0.083% nebulizer solution  Every 6 hours PRN        12/28/23 0737    oxyCODONE  (ROXICODONE ) 5 MG immediate release tablet  Every 4 hours PRN        12/28/23 0737               Raford Lenis, MD 12/28/23 [phone]

## 2024-01-24 DIAGNOSIS — G894 Chronic pain syndrome: Secondary | ICD-10-CM | POA: Diagnosis not present

## 2024-01-24 DIAGNOSIS — R1110 Vomiting, unspecified: Secondary | ICD-10-CM | POA: Diagnosis not present
